# Patient Record
Sex: Female | Born: 1965 | Race: White | Hispanic: No | Smoking: Former smoker
Health system: Southern US, Community
[De-identification: ages and names within clinical notes are randomized; demographics above are authoritative.]

## PROBLEM LIST (undated history)

## (undated) DIAGNOSIS — E785 Hyperlipidemia, unspecified: Secondary | ICD-10-CM

## (undated) DIAGNOSIS — M797 Fibromyalgia: Secondary | ICD-10-CM

## (undated) DIAGNOSIS — F419 Anxiety disorder, unspecified: Secondary | ICD-10-CM

## (undated) DIAGNOSIS — J449 Chronic obstructive pulmonary disease, unspecified: Secondary | ICD-10-CM

## (undated) DIAGNOSIS — G35D Multiple sclerosis, unspecified: Secondary | ICD-10-CM

## (undated) DIAGNOSIS — G4730 Sleep apnea, unspecified: Secondary | ICD-10-CM

## (undated) DIAGNOSIS — F329 Major depressive disorder, single episode, unspecified: Secondary | ICD-10-CM

## (undated) DIAGNOSIS — IMO0002 Reserved for concepts with insufficient information to code with codable children: Secondary | ICD-10-CM

## (undated) DIAGNOSIS — I10 Essential (primary) hypertension: Secondary | ICD-10-CM

## (undated) DIAGNOSIS — G8929 Other chronic pain: Secondary | ICD-10-CM

## (undated) DIAGNOSIS — K219 Gastro-esophageal reflux disease without esophagitis: Secondary | ICD-10-CM

## (undated) DIAGNOSIS — F32A Depression, unspecified: Secondary | ICD-10-CM

## (undated) DIAGNOSIS — D126 Benign neoplasm of colon, unspecified: Secondary | ICD-10-CM

## (undated) DIAGNOSIS — R519 Headache, unspecified: Secondary | ICD-10-CM

## (undated) DIAGNOSIS — T148XXA Other injury of unspecified body region, initial encounter: Secondary | ICD-10-CM

## (undated) DIAGNOSIS — T7840XA Allergy, unspecified, initial encounter: Secondary | ICD-10-CM

## (undated) DIAGNOSIS — R51 Headache: Secondary | ICD-10-CM

## (undated) DIAGNOSIS — G4733 Obstructive sleep apnea (adult) (pediatric): Secondary | ICD-10-CM

## (undated) DIAGNOSIS — J45909 Unspecified asthma, uncomplicated: Secondary | ICD-10-CM

## (undated) DIAGNOSIS — G35 Multiple sclerosis: Secondary | ICD-10-CM

## (undated) HISTORY — PX: ECTOPIC PREGNANCY SURGERY: SHX613

## (undated) HISTORY — DX: Sleep apnea, unspecified: G47.30

## (undated) HISTORY — PX: EXPLORATORY LAPAROTOMY: SUR591

## (undated) HISTORY — DX: Headache: R51

## (undated) HISTORY — DX: Headache, unspecified: R51.9

## (undated) HISTORY — DX: Hyperlipidemia, unspecified: E78.5

## (undated) HISTORY — DX: Major depressive disorder, single episode, unspecified: F32.9

## (undated) HISTORY — PX: SINUS IRRIGATION: SHX2411

## (undated) HISTORY — DX: Benign neoplasm of colon, unspecified: D12.6

## (undated) HISTORY — DX: Obstructive sleep apnea (adult) (pediatric): G47.33

## (undated) HISTORY — DX: Allergy, unspecified, initial encounter: T78.40XA

## (undated) HISTORY — DX: Depression, unspecified: F32.A

## (undated) HISTORY — DX: Fibromyalgia: M79.7

## (undated) HISTORY — PX: COLONOSCOPY: SHX174

## (undated) HISTORY — DX: Unspecified asthma, uncomplicated: J45.909

## (undated) HISTORY — PX: DILATION AND CURETTAGE OF UTERUS: SHX78

## (undated) HISTORY — PX: APPENDECTOMY: SHX54

## (undated) HISTORY — PX: TEMPOROMANDIBULAR JOINT SURGERY: SHX35

## (undated) HISTORY — DX: Gastro-esophageal reflux disease without esophagitis: K21.9

## (undated) HISTORY — PX: TUBAL LIGATION: SHX77

## (undated) HISTORY — DX: Other chronic pain: G89.29

## (undated) HISTORY — PX: ESOPHAGOGASTRODUODENOSCOPY: SHX1529

## (undated) HISTORY — DX: Anxiety disorder, unspecified: F41.9

---

## 2005-12-19 ENCOUNTER — Emergency Department (HOSPITAL_COMMUNITY): Admission: EM | Admit: 2005-12-19 | Discharge: 2005-12-19 | Payer: Self-pay | Admitting: Emergency Medicine

## 2012-10-09 ENCOUNTER — Emergency Department (HOSPITAL_COMMUNITY)
Admission: EM | Admit: 2012-10-09 | Discharge: 2012-10-09 | Disposition: A | Discharge status: Home / Self Care | Payer: Medicare Other | Attending: Emergency Medicine | Admitting: Emergency Medicine

## 2012-10-09 ENCOUNTER — Emergency Department (HOSPITAL_COMMUNITY): Payer: Medicare Other

## 2012-10-09 ENCOUNTER — Encounter (HOSPITAL_COMMUNITY): Payer: Self-pay

## 2012-10-09 DIAGNOSIS — M533 Sacrococcygeal disorders, not elsewhere classified: Secondary | ICD-10-CM | POA: Insufficient documentation

## 2012-10-09 DIAGNOSIS — F17200 Nicotine dependence, unspecified, uncomplicated: Secondary | ICD-10-CM | POA: Insufficient documentation

## 2012-10-09 DIAGNOSIS — G8929 Other chronic pain: Secondary | ICD-10-CM | POA: Insufficient documentation

## 2012-10-09 HISTORY — DX: Reserved for concepts with insufficient information to code with codable children: IMO0002

## 2012-10-09 MED ORDER — CYCLOBENZAPRINE HCL 5 MG PO TABS: 5.0000 mg | ORAL_TABLET | Freq: Three times a day (TID) | ORAL | Status: DC | PRN

## 2012-10-09 MED ORDER — PREDNISONE 20 MG PO TABS: ORAL_TABLET | ORAL | Status: DC

## 2012-10-09 MED ORDER — PREDNISONE 20 MG PO TABS
60.0000 mg | ORAL_TABLET | Freq: Once | ORAL | Status: AC
  Administered 2012-10-09: 60 mg via ORAL
  Filled 2012-10-09: qty 3

## 2012-10-09 MED ORDER — KETOROLAC TROMETHAMINE 60 MG/2ML IM SOLN
60.0000 mg | Freq: Once | INTRAMUSCULAR | Status: AC
  Administered 2012-10-09: 60 mg via INTRAMUSCULAR
  Filled 2012-10-09: qty 2

## 2012-10-09 MED ORDER — CYCLOBENZAPRINE HCL 10 MG PO TABS
5.0000 mg | ORAL_TABLET | Freq: Once | ORAL | Status: AC
  Administered 2012-10-09: 5 mg via ORAL
  Filled 2012-10-09: qty 1

## 2012-10-09 NOTE — ED Notes (Signed)
Pt presents with R hip pain after attempting to lift her 280 pound mother last night.  Pt reports feeling hip "pop in and pop out".

## 2012-10-09 NOTE — ED Provider Notes (Signed)
History  This chart was scribed for [name], MD by [name]. This patient was seen in room TR06C/TR06C and the patient's care was started at 1241.   CSN: [id number]  Arrival date & time 10/09/12  1241   None     Chief Complaint  Patient presents with  . Hip Pain   The history is provided by the patient. No language interpreter was used.   Tracy Gray is a 46 y.o. female who presents to the Emergency Department w/hx of hip pain complaining of constant sudden right hip pain after attempting to lift her 280 lb. Mother last PM. She sates she felt it pop in and out of place and now her pain occurs in sitting to standing movements. She states hip subluxation about 4 times in past  15 years but has not had to be seen for it. She states bilateral hip pain after using bathroom this AM. Pain is worse with sitting and standing up but not with standing or walking. [place] is going back home soon.   Pt was holding her mother around the waist to help her get out of bed to the bedside commode and her mother started to fall and she braced hereelf and felt like both her hips "went almost out of joint" then went back in. She now has pain this morning when she stood up off the toilet in her SIJ area when she stands up from sitting. No pain with walking or standing.   Pt was seen by her PCP and neurologist 2 days ago, ? That she has MS.   She smokes 1ppd, and does not drink. She is on disability for DJD. On hydrocodone from PCP for chronic pain. She has no orthopedist at home.   Her PCP is Dr. Sidonie Dickens on the [facility]  Past Medical History  Diagnosis Date  . Degenerative disc disease   chronic back pain, uses TENS unit  History reviewed. No pertinent past surgical history.  No family history on file.  History  Substance Use Topics  . Smoking status: Current Every Day Smoker -- 1.0 packs/day  . Smokeless tobacco: Not on file  . Alcohol  Use: No  on disability for DJD of back  OB History    Grav Para Term Preterm Abortions TAB SAB Ect Mult Living                  Review of Systems  Constitutional: Negative for fever and chills.  Respiratory: Negative for shortness of breath.   Gastrointestinal: Negative for nausea and vomiting.  Musculoskeletal:       Bilateral hip pain  Neurological: Negative for weakness.  All other systems reviewed and are negative.    Allergies  Bee venom and Penicillins  Home Medications   Current Outpatient Rx  Name Route Sig Dispense Refill  . ALBUTEROL SULFATE HFA 108 (90 BASE) MCG/ACT IN AERS Inhalation Inhale 2 puffs into the lungs 2 (two) times daily. May also take as needed for shortness of breath    . ALBUTEROL SULFATE (2.5 MG/3ML) 0.083% IN NEBU Nebulization Take 2.5 mg by nebulization every 4 (four) hours as needed. For shortness of breath    . BACLOFEN 10 MG PO TABS Oral Take 10-15 mg by mouth 4 (four) times daily - after meals and at bedtime. Take 1 tab 3 times daily and 1.5 tab at bedtime    . CLONAZEPAM 1  MG PO TABS Oral Take 1 mg by mouth 3 (three) times daily.    Marland Kitchen CLONIDINE HCL 0.1 MG PO TABS Oral Take 0.1 mg by mouth daily.    Marland Kitchen FLUTICASONE PROPIONATE 50 MCG/ACT NA SUSP Nasal Place 2 sprays into the nose 2 (two) times daily as needed. For nasal congestion    . FOLIC ACID 400 MCG PO TABS Oral Take 400 mcg by mouth 2 (two) times daily.    . FUROSEMIDE 40 MG PO TABS Oral Take 40 mg by mouth daily.    . GUAIFENESIN ER 600 MG PO TB12 Oral Take 600 mg by mouth daily.    Marland Kitchen HYDROCODONE-ACETAMINOPHEN 7.5-650 MG PO TABS Oral Take 1 tablet by mouth 4 (four) times daily.    Marland Kitchen MAGNESIUM OXIDE 400 (241.3 MG) MG PO TABS Oral Take 400 mg by mouth 2 (two) times daily.    Marland Kitchen MONTELUKAST SODIUM 10 MG PO TABS Oral Take 10 mg by mouth at bedtime.    . OLOPATADINE HCL 0.1 % OP SOLN Both Eyes Place 1 drop into both eyes every 6 (six) hours.    Marland Kitchen POLYETHYL GLYCOL-PROPYL GLYCOL 0.4-0.3 % OP SOLN  Both Eyes Place 1 drop into both eyes 4 (four) times daily as needed. For dry eyes    . POTASSIUM PO Oral Take 1 tablet by mouth 4 (four) times daily.    Marland Kitchen RANITIDINE HCL 300 MG PO TABS Oral Take 300 mg by mouth at bedtime.    Marland Kitchen VITAMIN C 500 MG PO TABS Oral Take 500 mg by mouth 2 (two) times daily.    Marland Kitchen VITAMIN E 400 UNITS PO CAPS Oral Take 400 Units by mouth 2 (two) times daily.      Triage Vitals: BP 108/49  Pulse 69  Temp 98.4 F (36.9 C) (Oral)  Resp 18  Ht 5\' 4"  (1.626 m)  Wt 142 lb (64.411 kg)  BMI 24.37 kg/m2  SpO2 100%  Vital signs normal    Physical Exam  Nursing note and vitals reviewed. Constitutional: She is oriented to person, place, and time. She appears well-developed and well-nourished.  Non-toxic appearance. She does not appear ill. No distress.  HENT:  Head: Normocephalic and atraumatic.  Right Ear: External ear normal.  Left Ear: External ear normal.  Nose: Nose normal. No mucosal edema or rhinorrhea.  Mouth/Throat: Oropharynx is clear and moist and mucous membranes are normal. No dental abscesses or uvula swelling.  Eyes: Conjunctivae normal and EOM are normal. Pupils are equal, round, and reactive to light.  Neck: Normal range of motion and full passive range of motion without pain. Neck supple.  Pulmonary/Chest: Effort normal. No respiratory distress. She has no rhonchi. She exhibits no crepitus.  Abdominal: Normal appearance.  Musculoskeletal: Normal range of motion. She exhibits tenderness. She exhibits no edema.       Moves all extremities well.  Non tender over lumbar/thoracic spine. No pain with ROM of her  Hips or knees. No pain with ROM of her right hip . Pain localized to her SI joints the right worse than the left.  Neurological: She is alert and oriented to person, place, and time. She has normal strength. No cranial nerve deficit.  Skin: Skin is warm, dry and intact. No rash noted. No erythema. No pallor.  Psychiatric: She has a normal mood and  affect. Her speech is normal and behavior is normal. Her mood appears not anxious.    ED Course  Procedures (including critical care time)  Medications  ketorolac (TORADOL) injection 60 mg (not administered)  predniSONE (DELTASONE) tablet 60 mg (not administered)  cyclobenzaprine (FLEXERIL) tablet 5 mg (not administered)   Pt states she is not here to get pain medications, she wants to know what is wrong.   DIAGNOSTIC STUDIES: Oxygen Saturation is 100% on room air, normal by my interpretation.    COORDINATION OF CARE: At 220 PM Discussed treatment plan with patient which includes toradol and pelvis X-ray. Patient agrees.    Dg Pelvis 1-2 Views  10/09/2012  *RADIOLOGY REPORT*  Clinical Data: Right hip pain.  PELVIS - 1-2 VIEW  Comparison: None.  Findings: No acute bony abnormality.  Specifically, no fracture, subluxation, or dislocation.  Soft tissues are intact. Joint spaces are maintained.  Normal bone mineralization.  IMPRESSION: No acute bony abnormality.   Original Report Authenticated By: Cyndie Chime, M.D.      1. Sacro-iliac pain     New Prescriptions   CYCLOBENZAPRINE (FLEXERIL) 5 MG TABLET    Take 1 tablet (5 mg total) by mouth 3 (three) times daily as needed for muscle spasms (muscle pain).   PREDNISONE (DELTASONE) 20 MG TABLET    Take 3 po QD x 2d starting tomorrow, then 2 po QD x 3d then 1 po QD x 3d    Plan discharge  Devoria Albe, MD, FACEP   MDM  I personally performed the services described in this documentation, which was scribed in my presence. The recorded information has been reviewed and considered.  Devoria Albe, MD, Armando Gang          [name], MD 10/09/12 [id number]

## 2013-04-25 ENCOUNTER — Emergency Department (HOSPITAL_COMMUNITY)
Admission: EM | Admit: 2013-04-25 | Discharge: 2013-04-25 | Disposition: A | Discharge status: Home / Self Care | Payer: Medicare Other | Attending: Emergency Medicine | Admitting: Emergency Medicine

## 2013-04-25 ENCOUNTER — Encounter (HOSPITAL_COMMUNITY): Payer: Self-pay | Admitting: Nurse Practitioner

## 2013-04-25 DIAGNOSIS — Y9301 Activity, walking, marching and hiking: Secondary | ICD-10-CM | POA: Insufficient documentation

## 2013-04-25 DIAGNOSIS — Z79899 Other long term (current) drug therapy: Secondary | ICD-10-CM | POA: Insufficient documentation

## 2013-04-25 DIAGNOSIS — Y92009 Unspecified place in unspecified non-institutional (private) residence as the place of occurrence of the external cause: Secondary | ICD-10-CM | POA: Insufficient documentation

## 2013-04-25 DIAGNOSIS — IMO0002 Reserved for concepts with insufficient information to code with codable children: Secondary | ICD-10-CM | POA: Insufficient documentation

## 2013-04-25 DIAGNOSIS — W268XXA Contact with other sharp object(s), not elsewhere classified, initial encounter: Secondary | ICD-10-CM | POA: Insufficient documentation

## 2013-04-25 DIAGNOSIS — F17200 Nicotine dependence, unspecified, uncomplicated: Secondary | ICD-10-CM | POA: Insufficient documentation

## 2013-04-25 DIAGNOSIS — S91309A Unspecified open wound, unspecified foot, initial encounter: Secondary | ICD-10-CM | POA: Insufficient documentation

## 2013-04-25 DIAGNOSIS — Z23 Encounter for immunization: Secondary | ICD-10-CM | POA: Insufficient documentation

## 2013-04-25 DIAGNOSIS — S91332A Puncture wound without foreign body, left foot, initial encounter: Secondary | ICD-10-CM

## 2013-04-25 DIAGNOSIS — Z8739 Personal history of other diseases of the musculoskeletal system and connective tissue: Secondary | ICD-10-CM | POA: Insufficient documentation

## 2013-04-25 HISTORY — DX: Chronic obstructive pulmonary disease, unspecified: J44.9

## 2013-04-25 HISTORY — DX: Other injury of unspecified body region, initial encounter: T14.8XXA

## 2013-04-25 HISTORY — DX: Multiple sclerosis, unspecified: G35.D

## 2013-04-25 HISTORY — DX: Multiple sclerosis: G35

## 2013-04-25 MED ORDER — TETANUS-DIPHTH-ACELL PERTUSSIS 5-2.5-18.5 LF-MCG/0.5 IM SUSP
0.5000 mL | Freq: Once | INTRAMUSCULAR | Status: AC
  Administered 2013-04-25: 0.5 mL via INTRAMUSCULAR
  Filled 2013-04-25: qty 0.5

## 2013-04-25 NOTE — ED Notes (Addendum)
Stepped on a rusty nail with L foot yesterday and she is concerned because she doesn't doesn't know when her last tetanus. Pt reports she cleaned wound with peroxide and pain is minimal. Ambulatory.

## 2013-04-25 NOTE — ED Provider Notes (Signed)
Medical screening examination/treatment/procedure(s) were performed by non-physician practitioner and as supervising physician I was immediately available for consultation/collaboration.   Kirstine Jacquin H Zain Lankford, MD 04/25/13 2327 

## 2013-04-25 NOTE — ED Provider Notes (Signed)
History    This chart was scribed for non-physician practitioner Garlon Hatchet working with Richardean Canal, MD by Donne Anon, ED Scribe. This patient was seen in room TR08C/TR08C and the patient's care was started at 1505.   CSN: [id number]  Arrival date & time 04/25/13  1439   First MD Initiated Contact with Patient 04/25/13 1505      Chief Complaint  Patient presents with  . Foot Injury     The history is provided by the patient. No language interpreter was used.   Tracy Gray is a 47 y.o. female who presents to the Emergency Department with foot pain. States she was walking in her yard and stepped on a piece of lattice with a nail still attached.  Nail protruded through her sandal and punctured the sole of her left foot. There was minimal blood loss following injury.  She cleaned wound thoroughly with peroxide and warm water.  Does not feel that there are any FB left in her foot but she is unsure.  She denies any numbness or tingling of foot. Her tetanus shot is not UTD.     Past Medical History  Diagnosis Date  . Degenerative disc disease     History reviewed. No pertinent past surgical history.  History reviewed. No pertinent family history.  History  Substance Use Topics  . Smoking status: Current Every Day Smoker -- 1.00 packs/day  . Smokeless tobacco: Not on file  . Alcohol Use: No     Review of Systems  Skin: Positive for wound.  Neurological: Negative for numbness.  All other systems reviewed and are negative.    Allergies  Bee venom and Penicillins  Home Medications   Current Outpatient Rx  Name  Route  Sig  Dispense  Refill  . albuterol (PROVENTIL HFA;VENTOLIN HFA) 108 (90 BASE) MCG/ACT inhaler   Inhalation   Inhale 2 puffs into the lungs 2 (two) times daily. May also take as needed for shortness of breath         . albuterol (PROVENTIL) (2.5 MG/3ML) 0.083% nebulizer solution   Nebulization   Take 2.5 mg by nebulization every 4 (four) hours  as needed. For shortness of breath         . baclofen (LIORESAL) 10 MG tablet   Oral   Take 10-15 mg by mouth 4 (four) times daily - after meals and at bedtime. Take 1 tab 3 times daily and 1.5 tab at bedtime         . clonazePAM (KLONOPIN) 1 MG tablet   Oral   Take 1 mg by mouth 3 (three) times daily.         . cloNIDine (CATAPRES) 0.1 MG tablet   Oral   Take 0.1 mg by mouth daily.         . cyclobenzaprine (FLEXERIL) 5 MG tablet   Oral   Take 1 tablet (5 mg total) by mouth 3 (three) times daily as needed for muscle spasms (muscle pain).   30 tablet   0   . fluticasone (FLONASE) 50 MCG/ACT nasal spray   Nasal   Place 2 sprays into the nose 2 (two) times daily as needed. For nasal congestion         . folic acid (FOLVITE) 400 MCG tablet   Oral   Take 400 mcg by mouth 2 (two) times daily.         . furosemide (LASIX) 40 MG tablet   Oral  Take 40 mg by mouth daily.         Marland Kitchen guaiFENesin (MUCINEX) 600 MG 12 hr tablet   Oral   Take 600 mg by mouth daily.         . hydrocodone-acetaminophen (LORCET PLUS) 7.5-650 MG per tablet   Oral   Take 1 tablet by mouth 4 (four) times daily.         . magnesium oxide (MAG-OX) 400 (241.3 MG) MG tablet   Oral   Take 400 mg by mouth 2 (two) times daily.         . montelukast (SINGULAIR) 10 MG tablet   Oral   Take 10 mg by mouth at bedtime.         Marland Kitchen olopatadine (PATANOL) 0.1 % ophthalmic solution   Both Eyes   Place 1 drop into both eyes every 6 (six) hours.         Bertram Gala Glycol-Propyl Glycol (SYSTANE) 0.4-0.3 % SOLN   Both Eyes   Place 1 drop into both eyes 4 (four) times daily as needed. For dry eyes         . POTASSIUM PO   Oral   Take 1 tablet by mouth 4 (four) times daily.         . predniSONE (DELTASONE) 20 MG tablet      Take 3 po QD x 2d starting tomorrow, then 2 po QD x 3d then 1 po QD x 3d   15 tablet   0   . ranitidine (ZANTAC) 300 MG tablet   Oral   Take 300 mg by mouth at  bedtime.         . vitamin C (ASCORBIC ACID) 500 MG tablet   Oral   Take 500 mg by mouth 2 (two) times daily.         . vitamin E 400 UNIT capsule   Oral   Take 400 Units by mouth 2 (two) times daily.           BP 109/61  Pulse 84  Temp(Src) 98.3 F (36.8 C)  Resp 20  SpO2 95%  Physical Exam  Nursing note and vitals reviewed. Constitutional: She is oriented to person, place, and time. She appears well-developed and well-nourished.  HENT:  Head: Normocephalic and atraumatic.  Eyes: Conjunctivae and EOM are normal.  Neck: Normal range of motion. Neck supple.  Cardiovascular: Normal rate, regular rhythm and normal heart sounds.   Pulmonary/Chest: Effort normal and breath sounds normal.  Musculoskeletal: Normal range of motion.  Puncture wound sole of left foot; No surrounding swelling, erythema or signs of infection, no FB present  Neurological: She is alert and oriented to person, place, and time.  Skin: Skin is warm and dry.  Psychiatric: She has a normal mood and affect.    ED Course  Procedures (including critical care time) DIAGNOSTIC STUDIES: Oxygen Saturation is 95% on room air, adequate by my interpretation.    COORDINATION OF CARE: 3:09 PM Discussed treatment plan which includes Tetanus shot and examining for any foreign body with pt at bedside and pt agreed to plan.   3:33 PM Rechecked pt. Examined for any foreign body remaining in foot.   Labs Reviewed - No data to display No results found.   1. Puncture wound of foot, left, initial encounter       MDM   Patient presented to the ED for puncture wound of left foot. Wound was explored, no foreign bodies identified. Tetanus updated.  Encouraged to keep wound clean and monitor for signs of infection including redness, swelling, or abscess formation. Return precautions advised.  I personally performed the services described in this documentation, which was scribed in my presence. The recorded  information has been reviewed and is accurate.       Garlon Hatchet, PA-C 04/25/13 1719

## 2013-12-24 DIAGNOSIS — R252 Cramp and spasm: Secondary | ICD-10-CM | POA: Insufficient documentation

## 2014-04-08 ENCOUNTER — Other Ambulatory Visit: Payer: Self-pay | Admitting: Internal Medicine

## 2014-04-08 ENCOUNTER — Ambulatory Visit
Admission: RE | Admit: 2014-04-08 | Discharge: 2014-04-08 | Disposition: A | Discharge status: Home / Self Care | Payer: Medicare Other | Source: Ambulatory Visit | Attending: Internal Medicine | Admitting: Internal Medicine

## 2014-04-08 DIAGNOSIS — R059 Cough, unspecified: Secondary | ICD-10-CM

## 2014-04-08 DIAGNOSIS — R05 Cough: Secondary | ICD-10-CM

## 2015-03-22 ENCOUNTER — Other Ambulatory Visit: Payer: Self-pay | Admitting: Internal Medicine

## 2015-03-22 DIAGNOSIS — E2839 Other primary ovarian failure: Secondary | ICD-10-CM

## 2015-03-23 ENCOUNTER — Other Ambulatory Visit: Payer: Self-pay

## 2015-03-23 DIAGNOSIS — Z1231 Encounter for screening mammogram for malignant neoplasm of breast: Secondary | ICD-10-CM

## 2015-03-26 ENCOUNTER — Encounter (HOSPITAL_COMMUNITY): Payer: Self-pay

## 2015-03-26 ENCOUNTER — Emergency Department (HOSPITAL_COMMUNITY)
Admission: EM | Admit: 2015-03-26 | Discharge: 2015-03-26 | Disposition: A | Discharge status: Home / Self Care | Payer: Medicare Other | Attending: Emergency Medicine | Admitting: Emergency Medicine

## 2015-03-26 DIAGNOSIS — H66002 Acute suppurative otitis media without spontaneous rupture of ear drum, left ear: Secondary | ICD-10-CM | POA: Insufficient documentation

## 2015-03-26 DIAGNOSIS — M1990 Unspecified osteoarthritis, unspecified site: Secondary | ICD-10-CM | POA: Insufficient documentation

## 2015-03-26 DIAGNOSIS — Z79899 Other long term (current) drug therapy: Secondary | ICD-10-CM | POA: Insufficient documentation

## 2015-03-26 DIAGNOSIS — H9202 Otalgia, left ear: Secondary | ICD-10-CM | POA: Diagnosis present

## 2015-03-26 DIAGNOSIS — J029 Acute pharyngitis, unspecified: Secondary | ICD-10-CM | POA: Insufficient documentation

## 2015-03-26 DIAGNOSIS — J441 Chronic obstructive pulmonary disease with (acute) exacerbation: Secondary | ICD-10-CM | POA: Insufficient documentation

## 2015-03-26 DIAGNOSIS — Z880 Allergy status to penicillin: Secondary | ICD-10-CM | POA: Diagnosis not present

## 2015-03-26 DIAGNOSIS — Z720 Tobacco use: Secondary | ICD-10-CM | POA: Insufficient documentation

## 2015-03-26 MED ORDER — HYDROCODONE-ACETAMINOPHEN 5-325 MG PO TABS
1.0000 | ORAL_TABLET | Freq: Once | ORAL | Status: AC
  Administered 2015-03-26: 1 via ORAL
  Filled 2015-03-26: qty 1

## 2015-03-26 MED ORDER — ANTIPYRINE-BENZOCAINE 5.4-1.4 % OT SOLN: 3.0000 [drp] | OTIC | Status: DC | PRN

## 2015-03-26 MED ORDER — AZITHROMYCIN 250 MG PO TABS: 250.0000 mg | ORAL_TABLET | Freq: Every day | ORAL | Status: DC

## 2015-03-26 MED ORDER — HYDROCODONE-ACETAMINOPHEN 5-325 MG PO TABS: 1.0000 | ORAL_TABLET | ORAL | Status: DC | PRN

## 2015-03-26 MED ORDER — IBUPROFEN 800 MG PO TABS: 800.0000 mg | ORAL_TABLET | Freq: Three times a day (TID) | ORAL | Status: DC | PRN

## 2015-03-26 NOTE — ED Provider Notes (Signed)
CSN: [id number]     Arrival date & time 03/26/15  1108 History  This chart was scribed for non-physician practitioner Clayton Bibles, PA-C, working with Davonna Belling, MD by Zola Button, ED Scribe. This patient was seen in room TR07C/TR07C and the patient's care was started at 11:39 AM.      Chief Complaint  Patient presents with  . Otalgia   The history is provided by the patient. No language interpreter was used.    HPI Comments: Tracy Gray is a 49 y.o. female with a hx of COPD who presents to the Emergency Department complaining of gradual onset, severe, worsening, pressure-like, left sided ear pain radiating  that started 3 days ago. Patient also reports having a ringing sensation that started last night, described as "constantly hearing roaring." She was seen by her PCP, Dr. Lorene Dy, 5 days ago for sinusitis and had received 2 shots (patient is unsure what shots they were). Patient notes that she had an episode of coughing 3 nights ago, during which she "felt her sinus empty to her ear." Her sinus symptoms improved, but her ear started hurting. She tried Hyland's earache drops and ibuprofen but without relief. She also reports having sore throat and cough. Patient also has SOB, but she attributes this to COPD and is unchanged from baseline. She has had some difficulty sleeping due to her symptoms. 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 Patient reports allergies to penicillin.  Past Medical History  Diagnosis Date  . Degenerative disc disease   . COPD (chronic obstructive pulmonary disease)   . Multiple sclerosis   . Nerve damage    Past Surgical History  Procedure Laterality Date  . Tubal ligation    . Sinus irrigation    . Temporomandibular joint surgery    . Exploratory laparotomy    . Ectopic pregnancy surgery     No family history on file. History  Substance Use Topics  . Smoking status: Current Every Day Smoker -- 1.00 packs/day  . Smokeless tobacco:  Not on file  . Alcohol Use: No   OB History    No data available     Review of Systems  Constitutional: Positive for chills.  HENT: Positive for ear pain, sore throat and tinnitus. Negative for congestion, dental problem, rhinorrhea, sinus pressure and trouble swallowing.   Respiratory: Positive for cough and shortness of breath.   Cardiovascular: Negative for chest pain.  Musculoskeletal: Negative for neck pain and neck stiffness.  Skin: Negative for rash and wound.  Allergic/Immunologic: Negative for immunocompromised state.  Hematological: Does not bruise/bleed easily.  Psychiatric/Behavioral: Negative for self-injury.      Allergies  Bee venom and Penicillins  Home Medications   Prior to Admission medications   Medication Sig Start Date End Date Taking? Authorizing Provider  albuterol (PROVENTIL HFA;VENTOLIN HFA) 108 (90 BASE) MCG/ACT inhaler Inhale 2 puffs into the lungs 2 (two) times daily. May also take as needed for shortness of breath    Historical Provider, MD  albuterol (PROVENTIL) (2.5 MG/3ML) 0.083% nebulizer solution Take 2.5 mg by nebulization every 4 (four) hours as needed. For shortness of breath    Historical Provider, MD  baclofen (LIORESAL) 10 MG tablet Take 10-15 mg by mouth 4 (four) times daily - after meals and at bedtime. Take 1 tab 3 times daily and 1.5 tab at bedtime    Historical Provider, MD  clonazePAM (KLONOPIN) 1 MG tablet Take 1 mg by mouth 3 (  three) times daily.    Historical Provider, MD  cloNIDine (CATAPRES) 0.1 MG tablet Take 0.1 mg by mouth daily.    Historical Provider, MD  cycloSPORINE (RESTASIS) 0.05 % ophthalmic emulsion Place 1 drop into both eyes 2 (two) times daily.    Historical Provider, MD  fluticasone (FLONASE) 50 MCG/ACT nasal spray Place 2 sprays into the nose 2 (two) times daily as needed. For nasal congestion    Historical Provider, MD  folic acid (FOLVITE) 299 MCG tablet Take 400 mcg by mouth 2 (two) times daily.    Historical  Provider, MD  furosemide (LASIX) 40 MG tablet Take 40 mg by mouth daily.    Historical Provider, MD  guaiFENesin (MUCINEX) 600 MG 12 hr tablet Take 600 mg by mouth daily.    Historical Provider, MD  HYDROcodone-acetaminophen (NORCO) 7.5-325 MG per tablet Take 1 tablet by mouth 4 (four) times daily as needed for pain.    Historical Provider, MD  magnesium oxide (MAG-OX) 400 (241.3 MG) MG tablet Take 400 mg by mouth 2 (two) times daily.    Historical Provider, MD  montelukast (SINGULAIR) 10 MG tablet Take 10 mg by mouth at bedtime.    Historical Provider, MD  Multiple Vitamin (MULTIVITAMIN WITH MINERALS) TABS Take 1 tablet by mouth daily.    Historical Provider, MD  olopatadine (PATANOL) 0.1 % ophthalmic solution Place 1 drop into both eyes every 6 (six) hours.    Historical Provider, MD  POTASSIUM PO Take 1 tablet by mouth 4 (four) times daily.    Historical Provider, MD  ranitidine (ZANTAC) 300 MG tablet Take 300 mg by mouth at bedtime.    Historical Provider, MD  vitamin C (ASCORBIC ACID) 500 MG tablet Take 500 mg by mouth 2 (two) times daily.    Historical Provider, MD  vitamin E 400 UNIT capsule Take 400 Units by mouth 2 (two) times daily.    Historical Provider, MD   BP 126/61 mmHg  Pulse 100  Temp(Src) 98.1 F (36.7 C) (Oral)  Resp 20  SpO2 99% Physical Exam  Constitutional: She appears well-developed and well-nourished. No distress.  HENT:  Head: Normocephalic and atraumatic.  Left Ear: Tympanic membrane is erythematous and bulging.  Nose: Right sinus exhibits no maxillary sinus tenderness and no frontal sinus tenderness. Left sinus exhibits no maxillary sinus tenderness and no frontal sinus tenderness.  Mouth/Throat: Oropharynx is clear and moist. No oropharyngeal exudate.  Left TM is bulging and opaque with overlying erythema. Generalized tenderness around left ear without palpable lymphadenopathy.  Eyes: Conjunctivae are normal.  Neck: Neck supple.  Cardiovascular: Normal rate  and regular rhythm.   Pulmonary/Chest: Effort normal and breath sounds normal. No respiratory distress. She has no wheezes. She has no rales.  Neurological: She is alert.  Skin: She is not diaphoretic.  Nursing note and vitals reviewed.   ED Course  Procedures  DIAGNOSTIC STUDIES: Oxygen Saturation is 99% on room air, normal by my interpretation.    COORDINATION OF CARE: 11:46 AM-Discussed treatment plan which includes medications with pt at bedside and pt agreed to plan.    Labs Review Labs Reviewed - No data to display  Imaging Review No results found.   EKG Interpretation None      MDM   Final diagnoses:  Acute suppurative otitis media of left ear without spontaneous rupture of tympanic membrane, recurrence not specified    Afebrile, nontoxic patient with otitis media.  No fever. Not immunocompromised.  Not ruptured. No e/o mastoiditis.  D/C home with azithromycin, pain medication.  Auralgan now off the market, cancelled with patient's pharmacist by phone. PCP follow up.  Discussed result, findings, treatment, and follow up  with patient.  Pt given return precautions.  Pt verbalizes understanding and agrees with plan.       I personally performed the services described in this documentation, which was scribed in my presence. The recorded information has been reviewed and is accurate.    Clayton Bibles, PA-C 03/26/15 [place], MD 03/29/15 2216

## 2015-03-26 NOTE — ED Notes (Signed)
Declined W/C at D/C and was escorted to lobby by RN. 

## 2015-03-26 NOTE — ED Notes (Signed)
Pt reports ear ache developing since last night with ringing sensation in ear. Pt in NAD. Denies fever/chills.

## 2015-03-26 NOTE — Discharge Instructions (Signed)
Read the information below.  Use the prescribed medication as directed.  Please discuss all new medications with your pharmacist.  Do not take additional tylenol while taking the prescribed pain medication to avoid overdose.  You may return to the Emergency Department at any time for worsening condition or any new symptoms that concern you.  If you develop fevers, uncontrolled pain, bleeding from your ear,  or difficulty breathing or swallowing return to the ER for a recheck.      Otitis Media Otitis media is redness, soreness, and inflammation of the middle ear. Otitis media may be caused by allergies or, most commonly, by infection. Often it occurs as a complication of the common cold. SIGNS AND SYMPTOMS Symptoms of otitis media may include:  Earache.  Fever.  Ringing in your ear.  Headache.  Leakage of fluid from the ear. DIAGNOSIS To diagnose otitis media, your health care provider will examine your ear with an otoscope. This is an instrument that allows your health care provider to see into your ear in order to examine your eardrum. Your health care provider also will ask you questions about your symptoms. TREATMENT  Typically, otitis media resolves on its own within 3-5 days. Your health care provider may prescribe medicine to ease your symptoms of pain. If otitis media does not resolve within 5 days or is recurrent, your health care provider may prescribe antibiotic medicines if he or she suspects that a bacterial infection is the cause. HOME CARE INSTRUCTIONS   If you were prescribed an antibiotic medicine, finish it all even if you start to feel better.  Take medicines only as directed by your health care provider.  Keep all follow-up visits as directed by your health care provider. SEEK MEDICAL CARE IF:  You have otitis media only in one ear, or bleeding from your nose, or both.  You notice a lump on your neck.  You are not getting better in 3-5 days.  You feel worse  instead of better. SEEK IMMEDIATE MEDICAL CARE IF:   You have pain that is not controlled with medicine.  You have swelling, redness, or pain around your ear or stiffness in your neck.  You notice that part of your face is paralyzed.  You notice that the bone behind your ear (mastoid) is tender when you touch it. MAKE SURE YOU:   Understand these instructions.  Will watch your condition.  Will get help right away if you are not doing well or get worse. Document Released: 09/13/2004 Document Revised: 04/25/2014 Document Reviewed: 07/06/2013  [facility] Patient Information 2015 [place], [place]. This information is not intended to replace advice given to you by your health care provider. Make sure you discuss any questions you have with your health care provider.

## 2015-03-28 ENCOUNTER — Other Ambulatory Visit: Payer: Medicare Other

## 2015-03-30 ENCOUNTER — Ambulatory Visit
Admission: RE | Admit: 2015-03-30 | Discharge: 2015-03-30 | Disposition: A | Discharge status: Home / Self Care | Payer: Medicare Other | Source: Ambulatory Visit | Attending: Internal Medicine | Admitting: Internal Medicine

## 2015-03-30 ENCOUNTER — Ambulatory Visit
Admission: RE | Admit: 2015-03-30 | Discharge: 2015-03-30 | Disposition: A | Discharge status: Home / Self Care | Payer: Medicare Other | Source: Ambulatory Visit

## 2015-03-30 DIAGNOSIS — Z1231 Encounter for screening mammogram for malignant neoplasm of breast: Secondary | ICD-10-CM

## 2015-03-30 DIAGNOSIS — E2839 Other primary ovarian failure: Secondary | ICD-10-CM

## 2015-08-13 ENCOUNTER — Emergency Department (HOSPITAL_COMMUNITY): Payer: Medicare Other

## 2015-08-13 ENCOUNTER — Emergency Department (HOSPITAL_COMMUNITY)
Admission: EM | Admit: 2015-08-13 | Discharge: 2015-08-13 | Disposition: A | Discharge status: Home / Self Care | Payer: Medicare Other | Attending: Emergency Medicine | Admitting: Emergency Medicine

## 2015-08-13 ENCOUNTER — Encounter (HOSPITAL_COMMUNITY): Payer: Self-pay

## 2015-08-13 DIAGNOSIS — S93401A Sprain of unspecified ligament of right ankle, initial encounter: Secondary | ICD-10-CM | POA: Diagnosis not present

## 2015-08-13 DIAGNOSIS — Y9389 Activity, other specified: Secondary | ICD-10-CM | POA: Insufficient documentation

## 2015-08-13 DIAGNOSIS — J449 Chronic obstructive pulmonary disease, unspecified: Secondary | ICD-10-CM | POA: Insufficient documentation

## 2015-08-13 DIAGNOSIS — Z720 Tobacco use: Secondary | ICD-10-CM | POA: Diagnosis not present

## 2015-08-13 DIAGNOSIS — Z87828 Personal history of other (healed) physical injury and trauma: Secondary | ICD-10-CM | POA: Insufficient documentation

## 2015-08-13 DIAGNOSIS — Z8739 Personal history of other diseases of the musculoskeletal system and connective tissue: Secondary | ICD-10-CM | POA: Diagnosis not present

## 2015-08-13 DIAGNOSIS — Z8669 Personal history of other diseases of the nervous system and sense organs: Secondary | ICD-10-CM | POA: Insufficient documentation

## 2015-08-13 DIAGNOSIS — Y998 Other external cause status: Secondary | ICD-10-CM | POA: Diagnosis not present

## 2015-08-13 DIAGNOSIS — Z79899 Other long term (current) drug therapy: Secondary | ICD-10-CM | POA: Diagnosis not present

## 2015-08-13 DIAGNOSIS — W1842XA Slipping, tripping and stumbling without falling due to stepping into hole or opening, initial encounter: Secondary | ICD-10-CM | POA: Diagnosis not present

## 2015-08-13 DIAGNOSIS — S99911A Unspecified injury of right ankle, initial encounter: Secondary | ICD-10-CM | POA: Diagnosis present

## 2015-08-13 DIAGNOSIS — Z880 Allergy status to penicillin: Secondary | ICD-10-CM | POA: Diagnosis not present

## 2015-08-13 DIAGNOSIS — Y9289 Other specified places as the place of occurrence of the external cause: Secondary | ICD-10-CM | POA: Insufficient documentation

## 2015-08-13 MED ORDER — HYDROCODONE-ACETAMINOPHEN 5-325 MG PO TABS: 1.0000 | ORAL_TABLET | Freq: Four times a day (QID) | ORAL | Status: DC | PRN

## 2015-08-13 NOTE — Discharge Instructions (Signed)

## 2015-08-13 NOTE — ED Provider Notes (Signed)
CSN: [id number]     Arrival date & time 08/13/15  2218 History  This chart was scribed for Tracy Circle, PA-C, working with [name], MD by [name], ED Scribe. The patient was seen in room TR07C/TR07C at 11:13 PM.    Chief Complaint  Patient presents with  . Ankle Injury      The history is provided by the patient. No language interpreter was used.    Tracy Gray is a 49 y.o. female who presents to the Emergency Department complaining of right ankle injury onset 2 hours ago PTA. Pt notes that she was feeding her dog when she accidentally stepped into a hole and rolled her foot. Pt notes that her foot began to change colors immediately following the incident and that is what prompted her to come into the ED tonight. Pt reports that she is having pain with bearing weight on the right foot. She denies wound, rash, joint swelling, and any other symptoms.    Past Medical History  Diagnosis Date  . Degenerative disc disease   . COPD (chronic obstructive pulmonary disease)   . Multiple sclerosis   . Nerve damage    Past Surgical History  Procedure Laterality Date  . Tubal ligation    . Sinus irrigation    . Temporomandibular joint surgery    . Exploratory laparotomy    . Ectopic pregnancy surgery     History reviewed. No pertinent family history. Social History  Substance Use Topics  . Smoking status: Current Every Day Smoker -- 0.50 packs/day  . Smokeless tobacco: None  . Alcohol Use: No   OB History    No data available     Review of Systems  Musculoskeletal: Positive for arthralgias. Negative for joint swelling.  Skin: Negative for color change, rash and wound.      Allergies  Bee venom and Penicillins  Home Medications   Prior to Admission medications   Medication Sig Start Date End Date Taking? Authorizing Provider  albuterol (PROVENTIL HFA;VENTOLIN HFA) 108 (90 BASE) MCG/ACT inhaler Inhale 2 puffs into the lungs 2 (two) times daily. May also  take as needed for shortness of breath    Historical Provider, MD  albuterol (PROVENTIL) (2.5 MG/3ML) 0.083% nebulizer solution Take 2.5 mg by nebulization every 4 (four) hours as needed. For shortness of breath    Historical Provider, MD  antipyrine-benzocaine Toniann Fail) otic solution Place 3 drops into the left ear every 2 (two) hours as needed for ear pain. 03/26/15   Clayton Bibles, PA-C  azithromycin (ZITHROMAX) 250 MG tablet Take 1 tablet (250 mg total) by mouth daily. Take first 2 tablets together, then 1 every day until finished. 03/26/15   Clayton Bibles, PA-C  baclofen (LIORESAL) 10 MG tablet Take 10-15 mg by mouth 4 (four) times daily - after meals and at bedtime. Take 1 tab 3 times daily and 1.5 tab at bedtime    Historical Provider, MD  clonazePAM (KLONOPIN) 1 MG tablet Take 1 mg by mouth 3 (three) times daily.    Historical Provider, MD  cloNIDine (CATAPRES) 0.1 MG tablet Take 0.1 mg by mouth daily.    Historical Provider, MD  cycloSPORINE (RESTASIS) 0.05 % ophthalmic emulsion Place 1 drop into both eyes 2 (two) times daily.    Historical Provider, MD  fluticasone (FLONASE) 50 MCG/ACT nasal spray Place 2 sprays into the nose 2 (two) times daily as needed. For nasal congestion    Historical Provider, MD  folic acid (FOLVITE) 287  MCG tablet Take 400 mcg by mouth 2 (two) times daily.    Historical Provider, MD  furosemide (LASIX) 40 MG tablet Take 40 mg by mouth daily.    Historical Provider, MD  guaiFENesin (MUCINEX) 600 MG 12 hr tablet Take 600 mg by mouth daily.    Historical Provider, MD  HYDROcodone-acetaminophen (NORCO/VICODIN) 5-325 MG per tablet Take 1 tablet by mouth every 4 (four) hours as needed for moderate pain or severe pain. 03/26/15   Clayton Bibles, PA-C  ibuprofen (ADVIL,MOTRIN) 800 MG tablet Take 1 tablet (800 mg total) by mouth every 8 (eight) hours as needed for mild pain or moderate pain. 03/26/15   Clayton Bibles, PA-C  magnesium oxide (MAG-OX) 400 (241.3 MG) MG tablet Take 400 mg by mouth 2  (two) times daily.    Historical Provider, MD  montelukast (SINGULAIR) 10 MG tablet Take 10 mg by mouth at bedtime.    Historical Provider, MD  Multiple Vitamin (MULTIVITAMIN WITH MINERALS) TABS Take 1 tablet by mouth daily.    Historical Provider, MD  olopatadine (PATANOL) 0.1 % ophthalmic solution Place 1 drop into both eyes every 6 (six) hours.    Historical Provider, MD  POTASSIUM PO Take 1 tablet by mouth 4 (four) times daily.    Historical Provider, MD  ranitidine (ZANTAC) 300 MG tablet Take 300 mg by mouth at bedtime.    Historical Provider, MD  vitamin C (ASCORBIC ACID) 500 MG tablet Take 500 mg by mouth 2 (two) times daily.    Historical Provider, MD  vitamin E 400 UNIT capsule Take 400 Units by mouth 2 (two) times daily.    Historical Provider, MD   BP 142/61 mmHg  Pulse 85  Temp(Src) 97.8 F (36.6 C) (Oral)  Resp 18  SpO2 97% Physical Exam  Constitutional: She is oriented to person, place, and time. She appears well-developed and well-nourished. No distress.  HENT:  Head: Normocephalic and atraumatic.  Eyes: EOM are normal.  Neck: Neck supple. No tracheal deviation present.  Cardiovascular: Normal rate and intact distal pulses.   Intact distal pulses. Brisk cap refill.   Pulmonary/Chest: Effort normal. No respiratory distress.  Musculoskeletal: Normal range of motion.  Right ankle mildly TTP over the medial aspect with no bony abnormality or deformity. Range, motion, and strength limited secondary to pain.  Neurological: She is alert and oriented to person, place, and time. No sensory deficit.  Nl sensation.  Skin: Skin is warm and dry.  Psychiatric: She has a normal mood and affect. Her behavior is normal.  Nursing note and vitals reviewed.   ED Course  Procedures (including critical care time) DIAGNOSTIC STUDIES: Oxygen Saturation is 97% on RA, nl by my interpretation.    COORDINATION OF CARE: 11:17 PM Discussed treatment plan with pt at bedside and pt agreed to  plan.   Labs Review Labs Reviewed - No data to display  Imaging Review Dg Ankle Complete Right  08/13/2015   CLINICAL DATA:  Patient stepped into a hole today with twisting injury to the right ankle. Medial pain and swelling.  EXAM: RIGHT ANKLE - COMPLETE 3+ VIEW  COMPARISON:  None.  FINDINGS: There is no evidence of fracture, dislocation, or joint effusion. There is no evidence of arthropathy or other focal bone abnormality. Soft tissues are unremarkable.  IMPRESSION: Negative.   Electronically Signed   By: Lucienne Capers M.D.   On: 08/13/2015 23:09   I have personally reviewed and evaluated these images and lab results as part of  my medical decision-making.   EKG Interpretation None      MDM   Final diagnoses:  Ankle sprain, right, initial encounter    Patient with ankle sprain.  Plain films are negative.  DC to home with crutches, ASO, and pain meds.  I personally performed the services described in this documentation, which was scribed in my presence. The recorded information has been reviewed and is accurate.     Tracy Circle, PA-C 08/13/15 2329  [name], MD 08/13/15 [phone]

## 2015-08-13 NOTE — ED Notes (Signed)
Pt stable, ambulatory, states understanding of discharge instructions 

## 2015-08-13 NOTE — ED Notes (Signed)
Onset 2 hours ago pt stepped in hole and rolled right foot.  Pain to medial ankle.  Not able to tolerate full weight on right foot.

## 2015-12-18 ENCOUNTER — Encounter (HOSPITAL_COMMUNITY): Payer: Self-pay | Admitting: *Deleted

## 2015-12-18 ENCOUNTER — Emergency Department (HOSPITAL_COMMUNITY): Payer: Medicare Other

## 2015-12-18 ENCOUNTER — Emergency Department (HOSPITAL_COMMUNITY)
Admission: EM | Admit: 2015-12-18 | Discharge: 2015-12-19 | Disposition: A | Discharge status: Home / Self Care | Payer: Medicare Other | Attending: Emergency Medicine | Admitting: Emergency Medicine

## 2015-12-18 DIAGNOSIS — Z7951 Long term (current) use of inhaled steroids: Secondary | ICD-10-CM | POA: Insufficient documentation

## 2015-12-18 DIAGNOSIS — Z880 Allergy status to penicillin: Secondary | ICD-10-CM | POA: Diagnosis not present

## 2015-12-18 DIAGNOSIS — Z87828 Personal history of other (healed) physical injury and trauma: Secondary | ICD-10-CM | POA: Diagnosis not present

## 2015-12-18 DIAGNOSIS — M519 Unspecified thoracic, thoracolumbar and lumbosacral intervertebral disc disorder: Secondary | ICD-10-CM | POA: Insufficient documentation

## 2015-12-18 DIAGNOSIS — Z8669 Personal history of other diseases of the nervous system and sense organs: Secondary | ICD-10-CM | POA: Diagnosis not present

## 2015-12-18 DIAGNOSIS — Z79899 Other long term (current) drug therapy: Secondary | ICD-10-CM | POA: Diagnosis not present

## 2015-12-18 DIAGNOSIS — R05 Cough: Secondary | ICD-10-CM | POA: Diagnosis present

## 2015-12-18 DIAGNOSIS — J439 Emphysema, unspecified: Secondary | ICD-10-CM | POA: Insufficient documentation

## 2015-12-18 DIAGNOSIS — J40 Bronchitis, not specified as acute or chronic: Secondary | ICD-10-CM

## 2015-12-18 DIAGNOSIS — F17200 Nicotine dependence, unspecified, uncomplicated: Secondary | ICD-10-CM | POA: Insufficient documentation

## 2015-12-18 LAB — BASIC METABOLIC PANEL
Anion gap: 12 (ref 5–15)
BUN: 11 mg/dL (ref 6–20)
CO2: 23 mmol/L (ref 22–32)
Calcium: 8.9 mg/dL (ref 8.9–10.3)
Chloride: 105 mmol/L (ref 101–111)
Creatinine, Ser: 0.89 mg/dL (ref 0.44–1.00)
GFR calc Af Amer: >60 mL/min (ref >60)
GFR calc non Af Amer: >60 mL/min (ref >60)
Glucose, Bld: 127 mg/dL — ABNORMAL HIGH (ref 65–99)
Potassium: 3.2 mmol/L — ABNORMAL LOW (ref 3.5–5.1)
Sodium: 140 mmol/L (ref 135–145)

## 2015-12-18 LAB — CBC
HCT: 43.7 % (ref 36.0–46.0)
Hemoglobin: 14.3 g/dL (ref 12.0–15.0)
MCH: 30.8 pg (ref 26.0–34.0)
MCHC: 32.7 g/dL (ref 30.0–36.0)
MCV: 94.2 fL (ref 78.0–100.0)
Platelets: 209 10*3/uL (ref 150–400)
RBC: 4.64 MIL/uL (ref 3.87–5.11)
RDW: 13.8 % (ref 11.5–15.5)
WBC: 9.2 10*3/uL (ref 4.0–10.5)

## 2015-12-18 MED ORDER — DEXAMETHASONE SODIUM PHOSPHATE 10 MG/ML IJ SOLN
10.0000 mg | Freq: Once | INTRAMUSCULAR | Status: AC
  Administered 2015-12-19: 10 mg via INTRAMUSCULAR
  Filled 2015-12-18: qty 1

## 2015-12-18 NOTE — ED Provider Notes (Addendum)
CSN: [id number]     Arrival date & time 12/18/15  1626 History   First MD Initiated Contact with Patient 12/18/15 2122     Chief Complaint  Patient presents with  . Shortness of Breath  . Cough     (Consider location/radiation/quality/duration/timing/severity/associated sxs/prior Treatment) HPI Comments: Pt comes in with cc of cough. Pt has hx of COPD exacerbation. Pt has been coughing x 5 weeks, and she is s/p 2 rounds of antibiotics from pcp. Cough is producing some phlegm, it is white/thick. There is some chest discomfort, usually with cough and it is midsternal. Pain is moderately severe. Pt also reports that the cough is so severe, that she has trouble catching her breath and on occasions she felt like she was going to faint. Cough worse with talking and with cold air.    ROS 10 Systems reviewed and are negative for acute change except as noted in the HPI.     Patient is a 49 y.o. female presenting with shortness of breath and cough. The history is provided by the patient.  Shortness of Breath Associated symptoms: cough   Cough Associated symptoms: shortness of breath     Past Medical History  Diagnosis Date  . Degenerative disc disease   . COPD (chronic obstructive pulmonary disease) ([place])   . Multiple sclerosis ([place])   . Nerve damage    Past Surgical History  Procedure Laterality Date  . Tubal ligation    . Sinus irrigation    . Temporomandibular joint surgery    . Exploratory laparotomy    . Ectopic pregnancy surgery     No family history on file. Social History  Substance Use Topics  . Smoking status: Current Every Day Smoker -- 0.50 packs/day  . Smokeless tobacco: None  . Alcohol Use: No   OB History    No data available     Review of Systems  Respiratory: Positive for cough and shortness of breath.       Allergies  Bee venom and Penicillins  Home Medications   Prior to Admission medications   Medication Sig Start Date End Date Taking?  Authorizing Provider  albuterol (PROVENTIL HFA;VENTOLIN HFA) 108 (90 BASE) MCG/ACT inhaler Inhale 2 puffs into the lungs 2 (two) times daily. May also take as needed for shortness of breath   Yes Historical Provider, MD  baclofen (LIORESAL) 10 MG tablet Take 10-15 mg by mouth 4 (four) times daily - after meals and at bedtime. Take 1 tab 3 times daily and 1.5 tab at bedtime   Yes Historical Provider, MD  clarithromycin (BIAXIN) 250 MG tablet Take 250 mg by mouth 2 (two) times daily. 12/05/15 12/19/15 Yes Historical Provider, MD  clonazePAM (KLONOPIN) 1 MG tablet Take 2 mg by mouth at bedtime.    Yes Historical Provider, MD  cloNIDine (CATAPRES) 0.1 MG tablet Take 0.1 mg by mouth at bedtime.    Yes Historical Provider, MD  cyclobenzaprine (FLEXERIL) 10 MG tablet Take 10 mg by mouth 4 (four) times daily.   Yes Historical Provider, MD  cycloSPORINE (RESTASIS) 0.05 % ophthalmic emulsion Place 1 drop into both eyes 2 (two) times daily.   Yes Historical Provider, MD  estrogens, conjugated, (PREMARIN) 0.625 MG tablet Take 0.625 mg by mouth daily. 07/13/14  Yes Historical Provider, MD  fluticasone (FLONASE) 50 MCG/ACT nasal spray Place 2 sprays into the nose 2 (two) times daily. For nasal congestion   Yes Historical Provider, MD  folic acid (FOLVITE) [id number] MCG tablet Take  400 mcg by mouth 2 (two) times daily.   Yes Historical Provider, MD  furosemide (LASIX) 40 MG tablet Take 40 mg by mouth daily.   Yes Historical Provider, MD  guaiFENesin (MUCINEX) 600 MG 12 hr tablet Take 600 mg by mouth daily.   Yes Historical Provider, MD  HYDROcodone-acetaminophen (NORCO) 7.5-325 MG tablet Take 1 tablet by mouth every 6 (six) hours.   Yes Historical Provider, MD  HYDROcodone-acetaminophen (NORCO/VICODIN) 5-325 MG per tablet Take 1-2 tablets by mouth every 6 (six) hours as needed. 08/13/15  Yes [name], PA-C  magnesium oxide (MAG-OX) 400 (241.3 MG) MG tablet Take 400 mg by mouth 2 (two) times daily.   Yes Historical  Provider, MD  medroxyPROGESTERone (PROVERA) 10 MG tablet Take 10 mg by mouth daily. Takes 1 tab daily for 7 days, revolving regimen every 3 months for period. 11/09/15  Yes Historical Provider, MD  montelukast (SINGULAIR) 10 MG tablet Take 10 mg by mouth at bedtime.   Yes Historical Provider, MD  Multiple Vitamin (MULTIVITAMIN WITH MINERALS) TABS Take 1 tablet by mouth daily.   Yes Historical Provider, MD  Olopatadine HCl (PATADAY) 0.2 % SOLN Place 1 drop into both eyes daily.   Yes Historical Provider, MD  POTASSIUM PO Take 1-2 tablets by mouth 4 (four) times daily. Takes 1 tab 3 times daily and takes 2 tabs at bedtime   Yes Historical Provider, MD  ranitidine (ZANTAC) 300 MG tablet Take 300 mg by mouth every morning.    Yes Historical Provider, MD  vitamin C (ASCORBIC ACID) 500 MG tablet Take 500 mg by mouth 2 (two) times daily.   Yes Historical Provider, MD  vitamin E 400 UNIT capsule Take 400 Units by mouth 2 (two) times daily.   Yes Historical Provider, MD  antipyrine-benzocaine Toniann Fail) otic solution Place 3 drops into the left ear every 2 (two) hours as needed for ear pain. 03/26/15   Clayton Bibles, PA-C  azithromycin (ZITHROMAX) 250 MG tablet Take 1 tablet (250 mg total) by mouth daily. Take first 2 tablets together, then 1 every day until finished. 12/19/15   Varney Biles, MD  benzonatate (TESSALON) 100 MG capsule Take 1 capsule (100 mg total) by mouth every 8 (eight) hours. 12/19/15   Varney Biles, MD  cefdinir (OMNICEF) 300 MG capsule Take 1 capsule (300 mg total) by mouth 2 (two) times daily. 12/19/15   Varney Biles, MD  ibuprofen (ADVIL,MOTRIN) 800 MG tablet Take 1 tablet (800 mg total) by mouth every 8 (eight) hours as needed for mild pain or moderate pain. 03/26/15   Clayton Bibles, PA-C  predniSONE (DELTASONE) 10 MG tablet Take 6 tablets (60 mg total) by mouth daily. 12/19/15   Kazimir Hartnett, MD   BP 135/65 mmHg  Pulse 91  Temp(Src) 98.5 F (36.9 C) (Oral)  Resp 18  SpO2  97% Physical Exam  Constitutional: She is oriented to person, place, and time. She appears well-developed.  HENT:  Head: Normocephalic and atraumatic.  Eyes: Conjunctivae and EOM are normal. Pupils are equal, round, and reactive to light.  Neck: Normal range of motion. Neck supple.  Cardiovascular: Normal rate, regular rhythm and normal heart sounds.   Pulmonary/Chest: Effort normal. No respiratory distress. She has wheezes.  Abdominal: Soft. Bowel sounds are normal. She exhibits no distension. There is no tenderness. There is no rebound and no guarding.  Neurological: She is alert and oriented to person, place, and time.  Skin: Skin is warm and dry.  Nursing note and vitals  reviewed.   ED Course  Procedures (including critical care time) Labs Review Labs Reviewed  BASIC METABOLIC PANEL - Abnormal; Notable for the following:    Potassium 3.2 (*)    Glucose, Bld 127 (*)    All other components within normal limits  CBC  URINALYSIS, ROUTINE W REFLEX MICROSCOPIC (NOT AT [facility])    Imaging Review Dg Chest 2 View  12/18/2015  CLINICAL DATA:  Short of breath EXAM: CHEST  2 VIEW COMPARISON:  04/08/2014 FINDINGS: Bullous emphysema in the apices. Carotid vascular markings in the lung bases has progressed. This may be related to bullous emphysema in the apices however superimposed pneumonitis not excluded. The lungs are hyperinflated. Negative for heart failure or effusion. IMPRESSION: Apical bullous emphysema. Progressive crowding of markings in the bases bilaterally which may be due to atelectasis or pneumonia. Electronically Signed   By: Franchot Gallo M.D.   On: 12/18/2015 17:37   I have personally reviewed and evaluated these images and lab results as part of my medical decision-making.   EKG Interpretation   Date/Time:  Monday December 18 2015 16:36:08 EST Ventricular Rate:  117 PR Interval:  120 QRS Duration: 76 QT Interval:  312 QTC Calculation: 435 R Axis:   87 Text  Interpretation:  Sinus tachycardia Nonspecific ST and T wave  abnormality Abnormal ECG No acute changes No old tracing to compare  Confirmed by Christyna Letendre, MD, Thelma Comp [phone]) on 12/18/2015 11:12:58 PM      MDM   Final diagnoses:  Pulmonary emphysema, unspecified emphysema type ([place])  Bronchitis    Pt comes in with cc dib, cough. Pt has been treated with clarithromycin x 2 times, but no response. She is feeling worse despite those meds. Pt is noted to be tachycardic at arrival - she is actually on estrogen - but her symptoms are not consistent with PE (severe cough). Additionally, Hr in the 80s and 90s when i saw her. Will defer PE workup if clinically indicated.  Will tx for CAP/bronchitis agresively. She has taken cephalosporin w/o any side effects in the past. Will give pulm f/u.  Varney Biles, MD 12/19/15 [id number]  Varney Biles, MD 12/19/15 [id number]

## 2015-12-18 NOTE — ED Notes (Signed)
Pt reports cough/congestion for 3 weeks. Pt was given antibiotic and nebulizer.  Pt states that symptoms have worsened. Pt reports worsening SOB today. Pt states that she "passed out" today as well.

## 2015-12-19 DIAGNOSIS — J439 Emphysema, unspecified: Secondary | ICD-10-CM | POA: Diagnosis not present

## 2015-12-19 MED ORDER — BENZONATATE 100 MG PO CAPS: 100.0000 mg | ORAL_CAPSULE | Freq: Three times a day (TID) | ORAL | Status: DC

## 2015-12-19 MED ORDER — PREDNISONE 10 MG PO TABS: 60.0000 mg | ORAL_TABLET | Freq: Every day | ORAL | Status: DC

## 2015-12-19 MED ORDER — AZITHROMYCIN 250 MG PO TABS: 250.0000 mg | ORAL_TABLET | Freq: Every day | ORAL | Status: DC

## 2015-12-19 MED ORDER — CEFDINIR 300 MG PO CAPS: 300.0000 mg | ORAL_CAPSULE | Freq: Two times a day (BID) | ORAL | Status: DC

## 2015-12-19 NOTE — ED Notes (Signed)
Patient is alert and orientedx4.  Patient was explained discharge instructions and they understood them with no questions.   

## 2015-12-19 NOTE — Discharge Instructions (Signed)
Take the meds provided. See the Pulmonologist in 1 week.   Chronic Obstructive Pulmonary Disease Exacerbation Chronic obstructive pulmonary disease (COPD) is a common lung condition in which airflow from the lungs is limited. COPD is a general term that can be used to describe many different lung problems that limit airflow, including chronic bronchitis and emphysema. COPD exacerbations are episodes when breathing symptoms become much worse and require extra treatment. Without treatment, COPD exacerbations can be life threatening, and frequent COPD exacerbations can cause further damage to your lungs. CAUSES  Respiratory infections.  Exposure to smoke.  Exposure to air pollution, chemical fumes, or dust. Sometimes there is no apparent cause or trigger. RISK FACTORS  Smoking cigarettes.  Older age.  Frequent prior COPD exacerbations. SIGNS AND SYMPTOMS  Increased coughing.  Increased thick spit (sputum) production.  Increased wheezing.  Increased shortness of breath.  Rapid breathing.  Chest tightness. DIAGNOSIS Your medical history, a physical exam, and tests will help your health care provider make a diagnosis. Tests may include:  A chest X-ray.  Basic lab tests.  Sputum testing.  An arterial blood gas test. TREATMENT Depending on the severity of your COPD exacerbation, you may need to be admitted to a hospital for treatment. Some of the treatments commonly used to treat COPD exacerbations are:   Antibiotic medicines.  Bronchodilators. These are drugs that expand the air passages. They may be given with an inhaler or nebulizer. Spacer devices may be needed to help improve drug delivery.  Corticosteroid medicines.  Supplemental oxygen therapy.  Airway clearing techniques, such as noninvasive ventilation (NIV) and positive expiratory pressure (PEP). These provide respiratory support through a mask or other noninvasive device. HOME CARE INSTRUCTIONS  Do not  smoke. Quitting smoking is very important to prevent COPD from getting worse and exacerbations from happening as often.  Avoid exposure to all substances that irritate the airway, especially to tobacco smoke.  If you were prescribed an antibiotic medicine, finish it all even if you start to feel better.  Take all medicines as directed by your health care provider.It is important to use correct technique with inhaled medicines.  Drink enough fluids to keep your urine clear or pale yellow (unless you have a medical condition that requires fluid restriction).  Use a cool mist vaporizer. This makes it easier to clear your chest when you cough.  If you have a home nebulizer and oxygen, continue to use them as directed.  Maintain all necessary vaccinations to prevent infections.  Exercise regularly.  Eat a healthy diet.  Keep all follow-up appointments as directed by your health care provider. SEEK IMMEDIATE MEDICAL CARE IF:  You have worsening shortness of breath.  You have trouble talking.  You have severe chest pain.  You have blood in your sputum.  You have a fever.  You have weakness, vomit repeatedly, or faint.  You feel confused.  You continue to get worse. MAKE SURE YOU:  Understand these instructions.  Will watch your condition.  Will get help right away if you are not doing well or get worse.   This information is not intended to replace advice given to you by your health care provider. Make sure you discuss any questions you have with your health care provider.   Document Released: 10/06/2007 Document Revised: 12/30/2014 Document Reviewed: 08/13/2013 Elsevier Interactive Patient Education [employer].

## 2016-01-16 ENCOUNTER — Institutional Professional Consult (permissible substitution): Payer: Medicare Other | Admitting: Internal Medicine

## 2016-02-01 ENCOUNTER — Institutional Professional Consult (permissible substitution): Payer: Medicare Other | Admitting: Internal Medicine

## 2016-02-02 ENCOUNTER — Encounter (INDEPENDENT_AMBULATORY_CARE_PROVIDER_SITE_OTHER): Payer: Self-pay

## 2016-02-02 ENCOUNTER — Encounter: Payer: Self-pay | Admitting: Internal Medicine

## 2016-02-02 ENCOUNTER — Ambulatory Visit (INDEPENDENT_AMBULATORY_CARE_PROVIDER_SITE_OTHER): Payer: Medicare Other | Admitting: Internal Medicine

## 2016-02-02 VITALS — BP 116/76 | HR 86 | Ht 64.0 in | Wt 157.0 lb

## 2016-02-02 DIAGNOSIS — R058 Other specified cough: Secondary | ICD-10-CM | POA: Insufficient documentation

## 2016-02-02 DIAGNOSIS — Z720 Tobacco use: Secondary | ICD-10-CM

## 2016-02-02 DIAGNOSIS — R0600 Dyspnea, unspecified: Secondary | ICD-10-CM | POA: Insufficient documentation

## 2016-02-02 DIAGNOSIS — R05 Cough: Secondary | ICD-10-CM

## 2016-02-02 DIAGNOSIS — F17210 Nicotine dependence, cigarettes, uncomplicated: Secondary | ICD-10-CM

## 2016-02-02 MED ORDER — PANTOPRAZOLE SODIUM 40 MG PO TBEC: 40.0000 mg | DELAYED_RELEASE_TABLET | Freq: Every day | ORAL | Status: DC

## 2016-02-02 NOTE — Progress Notes (Signed)
Subjective:    Patient ID: Tracy Gray, female    DOB: 1966/12/23,     MRN: [id number]  HPI  6 yowf active smoker "sick"  since September 2016 with ear pain, nasal congestion, cough and sob rx per Dr Janeice Robinson with multiple abx and prednisone but no better then to ER > still only  some better  referred to pulmonary clinic 02/02/2016 for copd eval by EDP   02/02/2016 1st [facility] office visit/ [name]   Chief Complaint  Patient presents with  . Pulmonary Consult    Referred by [facility] after ED visit on 12/17/16. Pt states that she was dxed with COPD "years ago"- c/o increased SOB and cough for the past few months.  Cough is prod with clear to green sputum. She is SOB with or without any exertion. She is using albuterol 4 x per day on average.   symptoms came on abruptly p "virus" in Sept2016  and consistent of variable sob are worse after supper assoc with choking sensation / sore throat and overt HB and persistent Doe x car to house with groceries every time. Cough is worse in am's, minimally productive, only occ green now.  No obvious other patterns in day to day or daytime variabilty or assoc   cp or chest tightness, subjective wheeze overt hb symptoms. No unusual exp hx or h/o childhood pna/ asthma or knowledge of premature birth.  Sleeping ok without nocturnal  or early am exacerbation  of respiratory  c/o's or need for noct saba. Also denies any obvious fluctuation of symptoms with weather or environmental changes or other aggravating or alleviating factors except as outlined above   Current Medications, Allergies, Complete Past Medical History, Past Surgical History, Family History, and Social History were reviewed in [employer] record.            Review of Systems  Constitutional: Negative for fever, chills and unexpected weight change.  HENT: Positive for congestion and sore throat. Negative for dental problem, ear pain, nosebleeds,  postnasal drip, rhinorrhea, sinus pressure, sneezing, trouble swallowing and voice change.   Eyes: Negative for visual disturbance.  Respiratory: Positive for cough and shortness of breath. Negative for choking.   Cardiovascular: Negative for chest pain and leg swelling.  Gastrointestinal: Negative for vomiting, abdominal pain and diarrhea.  Genitourinary: Negative for difficulty urinating.       Acid heartburn Indigestion  Musculoskeletal: Positive for arthralgias.  Skin: Negative for rash.  Neurological: Negative for tremors, syncope and headaches.  Hematological: Does not bruise/bleed easily.       Objective:   Physical Exam  Hoarse amb wf nad with classic pseudowheezing some better with plm  Wt Readings from Last 3 Encounters:  02/02/16 157 lb (71.215 kg)  10/09/12 142 lb (64.411 kg)    Vital signs reviewed  HEENT: nl dentition, turbinates, and oropharynx. Nl external ear canals without cough reflex   NECK :  without JVD/Nodes/TM/ nl carotid upstrokes bilaterally   LUNGS: no acc muscle use,  Nl contour chest which is clear to A and P bilaterally without cough on insp or exp maneuvers   CV:  RRR  no s3 or murmur or increase in P2, no edema   ABD:  soft and nontender with nl inspiratory excursion in the supine position. 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 Carotid vascular markings in the lung bases has progressed. This may be related to bullous emphysema in the apices however superimposed pneumonitis not excluded.  The lungs are hyperinflated. Negative for heart failure or effusion.    Labs ordered/ reviewed:       Chemistry      Component Value Date/Time   NA 140 12/18/2015 1733   K 3.2* 12/18/2015 1733   CL 105 12/18/2015 1733   CO2 23 12/18/2015 1733   BUN 11 12/18/2015 1733   CREATININE 0.89 12/18/2015 1733      Component Value Date/Time   CALCIUM 8.9 12/18/2015 1733        Lab Results  Component Value Date   WBC 9.2 12/18/2015   HGB 14.3 12/18/2015   HCT 43.7 12/18/2015   MCV 94.2 12/18/2015   PLT 209 12/18/2015             Assessment & Plan:

## 2016-02-02 NOTE — Patient Instructions (Addendum)
Please see patient coordinator before you leave today  to schedule sinus ct   Pantoprazole (protonix) 40 mg   Take  30-60 min before first meal of the day and Zantac  @  bedtime until return to office - this is the best way to tell whether stomach acid is contributing to your problem.    GERD (REFLUX)  is an extremely common cause of respiratory symptoms just like yours , many times with no obvious heartburn at all.    It can be treated with medication, but also with lifestyle changes including elevation of the head of your bed (ideally with 6 inch  bed blocks),  Smoking cessation, avoidance of late meals, excessive alcohol, and avoid fatty foods, chocolate, peppermint, colas, red wine, and acidic juices such as orange juice.  NO MINT OR MENTHOL PRODUCTS SO NO COUGH DROPS  USE SUGARLESS CANDY INSTEAD (Jolley ranchers or Stover's or Life Savers) or even ice chips will also do - the key is to swallow to prevent all throat clearing. NO OIL BASED VITAMINS - use powdered substitutes.  Please schedule a follow up office visit in 4 weeks, sooner if needed  Add: did not go to lab/ ok to complete when returns

## 2016-02-03 DIAGNOSIS — F17210 Nicotine dependence, cigarettes, uncomplicated: Secondary | ICD-10-CM | POA: Insufficient documentation

## 2016-02-03 MED ORDER — RANITIDINE HCL 300 MG PO TABS: ORAL_TABLET | ORAL | Status: DC

## 2016-02-03 NOTE — Assessment & Plan Note (Signed)
Classic Upper airway cough syndrome, so named because it's frequently impossible to sort out how much is  CR/sinusitis with freq throat clearing (which can be related to primary GERD)   vs  causing  secondary (" extra esophageal")  GERD from wide swings in gastric pressure that occur with throat clearing, often  promoting self use of mint and menthol lozenges that reduce the lower esophageal sphincter tone and exacerbate the problem further in a cyclical fashion.   These are the same pts (now being labeled as having "irritable larynx syndrome" by some cough centers) who not infrequently have a history of having failed to tolerate ace inhibitors,  dry powder inhalers or biphosphonates or report having atypical reflux symptoms that don't respond to standard doses of PPI , and are easily confused as having aecopd or asthma flares by even experienced allergists/ pulmonologists.   At this point rx with max gerd then regroup in 4 weeks - see avs

## 2016-02-03 NOTE — Assessment & Plan Note (Signed)

## 2016-02-03 NOTE — Assessment & Plan Note (Addendum)
02/02/2016  Walked RA x 3 laps @ 185 ft each stopped due to End of study, nl pace, no sob or desat  / some dizziness    Symptoms are markedly disproportionate to objective findings and not clear this is a lung problem but pt does appear to have difficult airway management issues. DDX of  difficult airways management almost all start with A and  include Adherence, Ace Inhibitors, Acid Reflux, Active Sinus Disease, Alpha 1 Antitripsin deficiency, Anxiety masquerading as Airways dz,  ABPA,  Allergy(esp in young), Aspiration (esp in elderly), Adverse effects of meds,  Active smokers, A bunch of PE's (a small clot burden can't cause this syndrome unless there is already severe underlying pulm or vascular dz with poor reserve) plus two Bs  = Bronchiectasis and Beta blocker use..and one C= CHF   Adherence is always the initial "prime suspect" and is a multilayered concern that requires a "trust but verify" approach in every patient - starting with knowing how to use medications, especially inhalers, correctly, keeping up with refills and understanding the fundamental difference between maintenance and prns vs those medications only taken for a very short course and then stopped and not refilled.   ? Acid (or non-acid) GERD > always difficult to exclude as up to 75% of pts in some series report no assoc GI/ Heartburn symptoms> rec max (24h)  acid suppression and diet restrictions/ reviewed and instructions given in writing.   Active smoking (see separate a/p)   ? Anxiety > usually at the bottom of this list of usual suspects but should be much higher on this pt's based on H and P and note already on psychotropics .   ? Allergies/ asthma > allergy profile ordered  ? Active sinus dz > Ct sinus ordered   ? chf > no bnp on file,  Ordered   I had an extended discussion with the patient reviewing all relevant studies completed to date and  lasting 35 minutes of a 63minute visit    Each maintenance medication  was reviewed in detail including most importantly the difference between maintenance and prns and under what circumstances the prns are to be triggered using an action plan format that is not reflected in the computer generated alphabetically organized AVS.    Please see instructions for details which were reviewed in writing and the patient given a copy highlighting the part that I personally wrote and discussed at today's ov.

## 2016-02-06 ENCOUNTER — Other Ambulatory Visit: Payer: Self-pay | Admitting: *Deleted

## 2016-02-06 ENCOUNTER — Telehealth: Payer: Self-pay | Admitting: Internal Medicine

## 2016-02-06 ENCOUNTER — Ambulatory Visit (INDEPENDENT_AMBULATORY_CARE_PROVIDER_SITE_OTHER)
Admission: RE | Admit: 2016-02-06 | Discharge: 2016-02-06 | Disposition: A | Discharge status: Home / Self Care | Payer: Medicare Other | Source: Ambulatory Visit | Attending: Internal Medicine | Admitting: Internal Medicine

## 2016-02-06 DIAGNOSIS — R0600 Dyspnea, unspecified: Secondary | ICD-10-CM

## 2016-02-06 DIAGNOSIS — R05 Cough: Secondary | ICD-10-CM

## 2016-02-06 DIAGNOSIS — R058 Other specified cough: Secondary | ICD-10-CM

## 2016-02-06 MED ORDER — MOXIFLOXACIN HCL 400 MG PO TABS: 400.0000 mg | ORAL_TABLET | Freq: Every day | ORAL | Status: DC

## 2016-02-06 NOTE — Telephone Encounter (Signed)
Spoke with pt, states that her pharmacy told her that her insurance is not covering her medications prescribed by our office, needs covered alternatives.  Pt unsure of what medication is not covered.   Pt uses [facility] on [address].  Called pharmacy, states that the avelox is not covered by insurance.  Levaquin is covered alternative.    MW please advise.  Thanks!  ------------------------------------------------------  Notes Recorded by Tanda Rockers, MD on 02/06/2016 at 1:24 PM Call patient : Study is c/w sinusitis rec avelox 400 mg daily x 10 days

## 2016-02-07 MED ORDER — LEVOFLOXACIN 500 MG PO TABS: 500.0000 mg | ORAL_TABLET | Freq: Every day | ORAL | Status: DC

## 2016-02-07 NOTE — Telephone Encounter (Signed)
lmtcb x1 for pt. 

## 2016-02-07 NOTE — Telephone Encounter (Signed)
Ok levaquin 500 daily x 10 days

## 2016-02-07 NOTE — Telephone Encounter (Signed)
[phone], pt cb

## 2016-02-07 NOTE — Telephone Encounter (Signed)
Pt is aware of medication change. Rx has been sent in. Nothing further was needed. 

## 2016-03-01 ENCOUNTER — Encounter: Payer: Self-pay | Admitting: Internal Medicine

## 2016-03-01 ENCOUNTER — Ambulatory Visit (INDEPENDENT_AMBULATORY_CARE_PROVIDER_SITE_OTHER)
Admission: RE | Admit: 2016-03-01 | Discharge: 2016-03-01 | Disposition: A | Discharge status: Home / Self Care | Payer: Medicare Other | Source: Ambulatory Visit | Attending: Internal Medicine | Admitting: Internal Medicine

## 2016-03-01 ENCOUNTER — Other Ambulatory Visit: Payer: Self-pay | Admitting: Internal Medicine

## 2016-03-01 ENCOUNTER — Other Ambulatory Visit (INDEPENDENT_AMBULATORY_CARE_PROVIDER_SITE_OTHER): Payer: Medicare Other

## 2016-03-01 ENCOUNTER — Ambulatory Visit (INDEPENDENT_AMBULATORY_CARE_PROVIDER_SITE_OTHER): Payer: Medicare Other | Admitting: Internal Medicine

## 2016-03-01 VITALS — BP 108/80 | HR 96 | Wt 157.0 lb

## 2016-03-01 DIAGNOSIS — Z720 Tobacco use: Secondary | ICD-10-CM | POA: Diagnosis not present

## 2016-03-01 DIAGNOSIS — J45909 Unspecified asthma, uncomplicated: Secondary | ICD-10-CM | POA: Diagnosis not present

## 2016-03-01 DIAGNOSIS — F17210 Nicotine dependence, cigarettes, uncomplicated: Secondary | ICD-10-CM | POA: Diagnosis not present

## 2016-03-01 DIAGNOSIS — J329 Chronic sinusitis, unspecified: Secondary | ICD-10-CM | POA: Insufficient documentation

## 2016-03-01 DIAGNOSIS — R0600 Dyspnea, unspecified: Secondary | ICD-10-CM | POA: Diagnosis not present

## 2016-03-01 DIAGNOSIS — R05 Cough: Secondary | ICD-10-CM | POA: Diagnosis not present

## 2016-03-01 DIAGNOSIS — J449 Chronic obstructive pulmonary disease, unspecified: Secondary | ICD-10-CM | POA: Diagnosis not present

## 2016-03-01 DIAGNOSIS — R058 Other specified cough: Secondary | ICD-10-CM

## 2016-03-01 DIAGNOSIS — J320 Chronic maxillary sinusitis: Secondary | ICD-10-CM

## 2016-03-01 LAB — CBC WITH DIFFERENTIAL/PLATELET
Basophils Absolute: 0 10*3/uL (ref 0.0–0.1)
Basophils Relative: 0.4 % (ref 0.0–3.0)
Eosinophils Absolute: 0.1 10*3/uL (ref 0.0–0.7)
Eosinophils Relative: 1.2 % (ref 0.0–5.0)
HCT: 44.4 % (ref 36.0–46.0)
Hemoglobin: 15 g/dL (ref 12.0–15.0)
Lymphocytes Relative: 33.7 % (ref 12.0–46.0)
Lymphs Abs: 2.4 10*3/uL (ref 0.7–4.0)
MCHC: 33.7 g/dL (ref 30.0–36.0)
MCV: 90.3 fl (ref 78.0–100.0)
Monocytes Absolute: 0.8 10*3/uL (ref 0.1–1.0)
Monocytes Relative: 11.2 % (ref 3.0–12.0)
Neutro Abs: 3.8 10*3/uL (ref 1.4–7.7)
Neutrophils Relative %: 53.5 % (ref 43.0–77.0)
Platelets: 252 10*3/uL (ref 150.0–400.0)
RBC: 4.91 Mil/uL (ref 3.87–5.11)
RDW: 14 % (ref 11.5–15.5)
WBC: 7 10*3/uL (ref 4.0–10.5)

## 2016-03-01 LAB — BRAIN NATRIURETIC PEPTIDE: Pro B Natriuretic peptide (BNP): 45 pg/mL (ref 0.0–100.0)

## 2016-03-01 LAB — BASIC METABOLIC PANEL
BUN: 10 mg/dL (ref 6–23)
CO2: 30 mEq/L (ref 19–32)
Calcium: 9.6 mg/dL (ref 8.4–10.5)
Chloride: 106 mEq/L (ref 96–112)
Creatinine, Ser: 0.9 mg/dL (ref 0.40–1.20)
GFR: 70.17 mL/min (ref >60.00)
Glucose, Bld: 90 mg/dL (ref 70–99)
Potassium: 4.3 mEq/L (ref 3.5–5.1)
Sodium: 142 mEq/L (ref 135–145)

## 2016-03-01 LAB — TSH: TSH: 1.19 u[IU]/mL (ref 0.35–4.50)

## 2016-03-01 MED ORDER — MOMETASONE FURO-FORMOTEROL FUM 100-5 MCG/ACT IN AERO: INHALATION_SPRAY | RESPIRATORY_TRACT | Status: DC

## 2016-03-01 NOTE — Progress Notes (Signed)
Quick Note:  Spoke with pt and notified of results per Dr. Wert. Pt verbalized understanding and denied any questions.  ______ 

## 2016-03-01 NOTE — Progress Notes (Signed)
Subjective:   Patient ID: Tracy Gray, female    DOB: 1966-08-23,     MRN: [id number]    Brief patient profile:  35 yowf active smoker "sick"  since September 2016 with ear pain, nasal congestion, cough and sob rx per Dr Tracy Gray with multiple abx and prednisone but no better then to ER > still only  some better  referred to pulmonary clinic 02/02/2016 for copd eval by EDP    History of Present Illness  02/02/2016 1st [facility] office visit/ Tracy Gray   Chief Complaint  Patient presents with  . Pulmonary Consult    Referred by [facility] after ED visit on 12/17/16. Pt states that she was dxed with COPD "years ago"- c/o increased SOB and cough for the past few months.  Cough is prod with clear to green sputum. She is SOB with or without any exertion. She is using albuterol 4 x per day on average.   symptoms came on abruptly p "virus" in Sept 2016  and consistent of variable sob are worse after supper assoc with choking sensation / sore throat and overt HB and persistent Doe x car to house with groceries every time. Cough is worse in am's, minimally productive, only occ green now. rec   sinus ct > pos sinusitis > levaquin x 10   Pantoprazole (protonix) 40 mg   Take  30-60 min before first meal of the day and Zantac  @  bedtime until return to office -  GERD  Add: did not go to lab/ ok to complete when returns     03/01/2016  f/u ov/Tracy Gray re: acute/ chronic sinusitis s/p 10 d levaquin Chief Complaint  Patient presents with  . Follow-up    Breathing has slightly improved. She states chest tightness has resolved. She is still coughing with green sputum. Using albuterol 3 x daily on average.   waking up each am and need to use saba immediately despite singulair maint rx and green mucus despite levaquin   No obvious day to day or daytime variability or assoc  cp or chest tightness, subjective wheeze or overt sinus or hb symptoms. No unusual exp hx or h/o childhood pna/ asthma  or knowledge of premature birth.  Sleeping ok without nocturnal  or early am exacerbation  of respiratory  c/o's or need for noct saba. Also denies any obvious fluctuation of symptoms with weather or environmental changes or other aggravating or alleviating factors except as outlined above   Current Medications, Allergies, Complete Past Medical History, Past Surgical History, Family History, and Social History were reviewed in [employer] record.  ROS  The following are not active complaints unless bolded sore throat, dysphagia, dental problems, itching, sneezing,  nasal congestion or excess/ purulent secretions, ear ache,   fever, chills, sweats, unintended wt loss, classically pleuritic or exertional cp, hemoptysis,  orthopnea pnd or leg swelling, presyncope, palpitations, abdominal pain, anorexia, nausea, vomiting, diarrhea  or change in bowel or bladder habits, change in stools or urine, dysuria,hematuria,  rash, arthralgias, visual complaints, headache, numbness, weakness or ataxia or problems with walking or coordination,  change in mood/affect or memory.                       Objective:   Physical Exam  Hoarse amb wf nad  / still somewhat congested sounding cough   Wt Readings from Last 3 Encounters:  03/01/16 157 lb (71.215 kg)  02/02/16 157 lb (  71.215 kg)  10/09/12 142 lb (64.411 kg)    Vital signs reviewed   HEENT: nl dentition, turbinates, and oropharynx. Nl external ear canals without cough reflex   NECK :  without JVD/Nodes/TM/ nl carotid upstrokes bilaterally   LUNGS: no acc muscle use,  Nl contour chest which is clear to A and P bilaterally without cough on insp or exp maneuvers   CV:  RRR  no s3 or murmur or increase in P2, no edema   ABD:  soft and nontender with nl inspiratory excursion in the supine position. No bruits or organomegaly, bowel sounds nl  MS:  Nl gait/ ext warm without deformities, calf tenderness, cyanosis or  clubbing No obvious joint restrictions   SKIN: warm and dry without lesions    NEURO:  alert, approp, nl sensorium with  no motor deficits   CXR PA and Lateral:   03/01/2016 :    I personally reviewed images and agree with radiology impression as follows:   Stable bilateral upper lobe emphysematous disease. No acute cardiopulmonary abnormality seen.   Labs ordered/ reviewed:     Chemistry      Component Value Date/Time   NA 142 03/01/2016 1103   K 4.3 03/01/2016 1103   CL 106 03/01/2016 1103   CO2 30 03/01/2016 1103   BUN 10 03/01/2016 1103   CREATININE 0.90 03/01/2016 1103      Component Value Date/Time   CALCIUM 9.6 03/01/2016 1103        Lab Results  Component Value Date   WBC 7.0 03/01/2016   HGB 15.0 03/01/2016   HCT 44.4 03/01/2016   MCV 90.3 03/01/2016   PLT 252.0 03/01/2016        Lab Results  Component Value Date   TSH 1.19 03/01/2016     Lab Results  Component Value Date   PROBNP 45.0 03/01/2016                        Assessment & Plan:

## 2016-03-01 NOTE — Patient Instructions (Addendum)
Please see patient coordinator before you leave today  to schedule  ENT eval next   Please remember to go to the lab and x-ray department downstairs for your tests - we will call you with the results when they are available.  Plan A = Automatic =  dulera 100 Take 2 puffs first thing in am and then another 2 puffs about 12 hours later.   Plan B = Backup Only use your albuterol as a rescue medication to be used if you can't catch your breath by resting or doing a relaxed purse lip breathing pattern.  - The less you use it, the better it will work when you need it. - Ok to use up to 2 puffs  every 4 hours if you must but call for appointment if use goes up over your usual need - Don't leave home without it !!  (think of it like the spare tire for your car)   Plan C = Crisis - only use your albuterol nebulizer if you first try Plan B and it fails to help > ok to use the nebulizer up to every 4 hours but if start needing it regularly call for immediate appointment   Please schedule a follow up office visit in 6 weeks, call sooner if needed with pfts - When return bring your medications in 2 separate bags, the ones you take no matter(automatically)  what vs the as needed (only when you feel you need them)

## 2016-03-03 NOTE — Assessment & Plan Note (Signed)
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    Please see instructions for details which were reviewed in writing and the patient given a copy highlighting the part that I personally wrote and discussed at today's ov.

## 2016-03-03 NOTE — Assessment & Plan Note (Addendum)
>   3 min discussion I reviewed the Fletcher curve with the patient that basically indicates  if you quit smoking when your best day FEV1 is still well preserved (as is clearly still  the case here)  it is highly unlikely you will progress to severe disease and informed the patient there was no medication on the market that has proven to alter the curve/ its downward trajectory  or the likelihood of progression of their disease.  Therefore stopping smoking and maintaining abstinence is the most important aspect of care, not choice of inhalers or for that matter, doctors.

## 2016-03-03 NOTE — Assessment & Plan Note (Signed)
Sinus CT 02/06/2016 > Maxillary sinusitis> rx levaquin 500  daily x 10 days> min improvement 03/01/2016 > referred to ENT  Allergy profile 02/02/2016 >  Eos 0.1 /  IgE  Pending   Since allergic to pcn but still coughing up green mucus each am strongly suspect ongoing pnds/sinusitis and would like to have her eval by ENT next as risk generating resistant orgs/ [place] from prolonged abx in this setting

## 2016-03-05 ENCOUNTER — Telehealth: Payer: Self-pay | Admitting: Internal Medicine

## 2016-03-05 LAB — RESPIRATORY ALLERGY PROFILE REGION II ~~LOC~~
Allergen, Cedar tree, t12: <0.1 kU/L
Allergen, Comm Silver Birch, t9: <0.1 kU/L
Allergen, Cottonwood, t14: <0.1 kU/L
Allergen, D pternoyssinus,d7: <0.1 kU/L
Allergen, Mouse Urine Protein, e78: <0.1 kU/L
Allergen, Mulberry, t76: <0.1 kU/L
Allergen, Oak,t7: <0.1 kU/L
Alternaria Alternata: <0.1 kU/L
Aspergillus fumigatus, m3: <0.1 kU/L
Bermuda Grass: <0.1 kU/L
Box Elder IgE: <0.1 kU/L
Cat Dander: <0.1 kU/L
Cladosporium Herbarum: <0.1 kU/L
Cockroach: <0.1 kU/L
Common Ragweed: <0.1 kU/L
D. farinae: <0.1 kU/L
Dog Dander: <0.1 kU/L
Elm IgE: <0.1 kU/L
IgE (Immunoglobulin E), Serum: 8 kU/L (ref <115)
Johnson Grass: <0.1 kU/L
Pecan/Hickory Tree IgE: <0.1 kU/L
Penicillium Notatum: <0.1 kU/L
Rough Pigweed  IgE: <0.1 kU/L
Sheep Sorrel IgE: <0.1 kU/L
Timothy Grass: <0.1 kU/L

## 2016-03-05 LAB — FOOD ALLERGY PROFILE
Allergen, Salmon, f41: <0.1 kU/L
Almonds: <0.1 kU/L
Cashew IgE: <0.1 kU/L
Egg White IgE: <0.1 kU/L
Fish Cod: <0.1 kU/L
Hazelnut: <0.1 kU/L
Milk IgE: <0.1 kU/L
Peanut IgE: <0.1 kU/L
Scallop IgE: <0.1 kU/L
Sesame Seed f10: <0.1 kU/L
Shrimp IgE: <0.1 kU/L
Soybean IgE: <0.1 kU/L
Tuna IgE: <0.1 kU/L
Walnut: <0.1 kU/L
Wheat IgE: <0.1 kU/L

## 2016-03-05 NOTE — Telephone Encounter (Signed)
Per 03/01/16 OV: Patient Instructions       Please see patient coordinator before you leave today  to schedule  ENT eval next   Please remember to go to the lab and x-ray department downstairs for your tests - we will call you with the results when they are available.  Plan A = Automatic =  dulera 100 Take 2 puffs first thing in am and then another 2 puffs about 12 hours later.   Plan B = Backup Only use your albuterol as a rescue medication to be used if you can't catch your breath by resting or doing a relaxed purse lip breathing pattern.   - The less you use it, the better it will work when you need it. - Ok to use up to 2 puffs  every 4 hours if you must but call for appointment if use goes up over your usual need - Don't leave home without it !!  (think of it like the spare tire for your car)   Plan C = Crisis - only use your albuterol nebulizer if you first try Plan B and it fails to help > ok to use the nebulizer up to every 4 hours but if start needing it regularly call for immediate appointment   Please schedule a follow up office visit in 6 weeks, call sooner if needed with pfts - When return bring your medications in 2 separate bags, the ones you take no matter(automatically)  what vs the as needed (only when you feel you need them)    --  [employer] calling stating dulera needs PA. The covered alternatives are breo and advair. Please advise Dr. Melvyn Novas thanks

## 2016-03-06 MED ORDER — FLUTICASONE-SALMETEROL 115-21 MCG/ACT IN AERO: 2.0000 | INHALATION_SPRAY | Freq: Two times a day (BID) | RESPIRATORY_TRACT | Status: DC

## 2016-03-06 NOTE — Progress Notes (Signed)
Quick Note:  Called and spoke with pt. Reviewed results and recs. Pt voiced understanding and had no further questions. ______ 

## 2016-03-06 NOTE — Telephone Encounter (Signed)
Called spoke with pt. She has never tried the advair. This has been sent in. Nothing further needed

## 2016-03-06 NOTE — Telephone Encounter (Signed)
Unless she's tried and failed advair hfa they will require her to try the advair 115 2bid but can always give her a sample of the dulera 100 if needed until can sort out

## 2016-03-06 NOTE — Telephone Encounter (Signed)
LVM for pt to return call to discuss if she has ever tried Advair.

## 2016-03-22 DIAGNOSIS — J302 Other seasonal allergic rhinitis: Secondary | ICD-10-CM | POA: Insufficient documentation

## 2016-03-22 DIAGNOSIS — J342 Deviated nasal septum: Secondary | ICD-10-CM | POA: Insufficient documentation

## 2016-03-22 DIAGNOSIS — J343 Hypertrophy of nasal turbinates: Secondary | ICD-10-CM | POA: Insufficient documentation

## 2016-03-25 ENCOUNTER — Encounter: Payer: Self-pay | Admitting: Gastroenterology

## 2016-04-11 ENCOUNTER — Ambulatory Visit (INDEPENDENT_AMBULATORY_CARE_PROVIDER_SITE_OTHER): Payer: Medicare Other | Admitting: Internal Medicine

## 2016-04-11 DIAGNOSIS — J449 Chronic obstructive pulmonary disease, unspecified: Secondary | ICD-10-CM | POA: Diagnosis not present

## 2016-04-11 DIAGNOSIS — J45909 Unspecified asthma, uncomplicated: Secondary | ICD-10-CM | POA: Diagnosis not present

## 2016-04-11 LAB — PULMONARY FUNCTION TEST
DL/VA % pred: 66 %
DL/VA: 3.17 ml/min/mmHg/L
DLCO cor % pred: 42 %
DLCO cor: 10.42 ml/min/mmHg
DLCO unc % pred: 42 %
DLCO unc: 10.39 ml/min/mmHg
FEF 25-75 Post: 0.81 L/sec
FEF 25-75 Pre: 0.42 L/sec
FEF2575-%Change-Post: 94 %
FEF2575-%Pred-Post: 29 %
FEF2575-%Pred-Pre: 15 %
FEV1-%Change-Post: 13 %
FEV1-%Pred-Post: 47 %
FEV1-%Pred-Pre: 41 %
FEV1-Post: 1.32 L
FEV1-Pre: 1.16 L
FEV1FVC-%Change-Post: 0 %
FEV1FVC-%Pred-Pre: 75 %
FEV6-%Change-Post: 20 %
FEV6-%Pred-Post: 62 %
FEV6-%Pred-Pre: 51 %
FEV6-Post: 2.13 L
FEV6-Pre: 1.76 L
FEV6FVC-%Change-Post: 5 %
FEV6FVC-%Pred-Post: 100 %
FEV6FVC-%Pred-Pre: 94 %
FVC-%Change-Post: 14 %
FVC-%Pred-Post: 62 %
FVC-%Pred-Pre: 54 %
FVC-Post: 2.19 L
FVC-Pre: 1.92 L
Post FEV1/FVC ratio: 60 %
Post FEV6/FVC ratio: 97 %
Pre FEV1/FVC ratio: 61 %
Pre FEV6/FVC Ratio: 92 %
RV % pred: 201 %
RV: 3.64 L
TLC % pred: 110 %
TLC: 5.57 L

## 2016-04-11 NOTE — Progress Notes (Signed)
PFT done today. 

## 2016-04-15 ENCOUNTER — Telehealth: Payer: Self-pay | Admitting: Internal Medicine

## 2016-04-15 ENCOUNTER — Ambulatory Visit (INDEPENDENT_AMBULATORY_CARE_PROVIDER_SITE_OTHER): Payer: Medicare Other | Admitting: Internal Medicine

## 2016-04-15 ENCOUNTER — Encounter: Payer: Self-pay | Admitting: Internal Medicine

## 2016-04-15 VITALS — BP 114/74 | HR 94 | Ht 64.0 in | Wt 158.8 lb

## 2016-04-15 DIAGNOSIS — Z720 Tobacco use: Secondary | ICD-10-CM

## 2016-04-15 DIAGNOSIS — J449 Chronic obstructive pulmonary disease, unspecified: Secondary | ICD-10-CM | POA: Diagnosis not present

## 2016-04-15 DIAGNOSIS — F17210 Nicotine dependence, cigarettes, uncomplicated: Secondary | ICD-10-CM

## 2016-04-15 MED ORDER — BUDESONIDE-FORMOTEROL FUMARATE 160-4.5 MCG/ACT IN AERO: INHALATION_SPRAY | RESPIRATORY_TRACT | Status: DC

## 2016-04-15 NOTE — Telephone Encounter (Signed)
Finish up sample first to see what difference if any it makes then resume advair for now

## 2016-04-15 NOTE — Progress Notes (Signed)
Subjective:   Patient ID: Tracy Gray, female    DOB: 06-16-1966,     MRN: [id number]    Brief patient profile:  74 yowf active smoker "sick"  since September 2016 with ear pain, nasal congestion, cough and sob rx per Dr Janeice Robinson with multiple abx and prednisone but no better then to ER > still only  some better  referred to pulmonary clinic 02/02/2016 for copd eval by EDP    History of Present Illness  02/02/2016 1st [facility] office visit/ Tracy Gray   Chief Complaint  Patient presents with  . Pulmonary Consult    Referred by [facility] after ED visit on 12/17/16. Pt states that she was dxed with COPD "years ago"- c/o increased SOB and cough for the past few months.  Cough is prod with clear to green sputum. She is SOB with or without any exertion. She is using albuterol 4 x per day on average.   symptoms came on abruptly p "virus" in Sept 2016  and consistent of variable sob are worse after supper assoc with choking sensation / sore throat and overt HB and persistent Doe x car to house with groceries every time. Cough is worse in am's, minimally productive, only occ green now. rec   sinus ct > pos sinusitis > levaquin x 10   Pantoprazole (protonix) 40 mg   Take  30-60 min before first meal of the day and Zantac  @  bedtime until return to office -  GERD  Add: did not go to lab/ ok to complete when returns     03/01/2016  f/u ov/Tracy Gray re: acute/ chronic sinusitis s/p 10 d levaquin Chief Complaint  Patient presents with  . Follow-up    Breathing has slightly improved. She states chest tightness has resolved. She is still coughing with green sputum. Using albuterol 3 x daily on average.   waking up each am and need to use saba immediately despite singulair maint rx and green mucus despite levaquin  rec Please see patient coordinator before you leave today  to schedule  ENT eval next  Plan A = Automatic =  dulera 100 Take 2 puffs first thing in am and then another 2 puffs  about 12 hours later.  Plan B = Backup Only use your albuterol as a rescue medication Plan C = Crisis - only use your albuterol nebulizer if you first try Plan B and it fails to help > ok to use the nebulizer up to every 4 hours but if start needing it regularly call for immediate appointment    04/15/2016  f/u ov/Tracy Gray re: GOLD III/ still smoking/ changed to  advair 115 due  To insurance  Chief Complaint  Patient presents with  . Follow-up    PFT done 04/11/16. She c/o "feels like I don't have any air" x 3-4 days. She states that she also has "butterfly in my chest".  She is using albuterol inhaler 3 x daily on average and neb with atrovent  3 x per wk on average.   now on chantix / duoneb rarely and planning to regroup Dr  Wilburn Cornelia 4/25 Feels worse off symbicort and on advair    No obvious day to day or daytime variability or assoc  cp or chest tightness, subjective wheeze or overt sinus or hb symptoms. No unusual exp hx or h/o childhood pna/ asthma or knowledge of premature birth.  Sleeping ok without nocturnal  or early am exacerbation  of respiratory  c/o's or need for noct saba. Also denies any obvious fluctuation of symptoms with weather or environmental changes or other aggravating or alleviating factors except as outlined above   Current Medications, Allergies, Complete Past Medical History, Past Surgical History, Family History, and Social History were reviewed in [employer] record.  ROS  The following are not active complaints unless bolded sore throat, dysphagia, dental problems, itching, sneezing,  nasal congestion or excess/ purulent secretions, ear ache,   fever, chills, sweats, unintended wt loss, classically pleuritic or exertional cp, hemoptysis,  orthopnea pnd or leg swelling, presyncope, palpitations, abdominal pain, anorexia, nausea, vomiting, diarrhea  or change in bowel or bladder habits, change in stools or urine, dysuria,hematuria,  rash,  arthralgias, visual complaints, headache, numbness, weakness or ataxia or problems with walking or coordination,  change in mood/affect or memory.                    Objective:   Physical Exam  Hoarse amb wf nad  / minimally  congested sounding cough    04/15/2016      159   03/01/16 157 lb (71.215 kg)  02/02/16 157 lb (71.215 kg)  10/09/12 142 lb (64.411 kg)    Vital signs reviewed   HEENT: nl dentition, turbinates, and oropharynx. Nl external ear canals without cough reflex   NECK :  without JVD/Nodes/TM/ nl carotid upstrokes bilaterally   LUNGS: no acc muscle use,  Nl contour chest which is clear to A and P bilaterally without cough on insp or exp maneuvers   CV:  RRR  no s3 or murmur or increase in P2, no edema   ABD:  soft and nontender with nl inspiratory excursion in the supine position. No bruits or organomegaly, bowel sounds nl  MS:  Nl gait/ ext warm without deformities, calf tenderness, cyanosis or clubbing No obvious joint restrictions   SKIN: warm and dry without lesions    NEURO:  alert, approp, nl sensorium with  no motor deficits              Assessment & Plan:

## 2016-04-15 NOTE — Telephone Encounter (Signed)
I spoke with the pt and notified of recs per MW  She verbalized understanding  Nothing further needed

## 2016-04-15 NOTE — Patient Instructions (Signed)
Plan A = Automatic = Symbicort 160 Take 2 puffs first thing in am and then another 2 puffs about 12 hours later.   Plan B = Backup Only use your albuterol as a rescue medication to be used if you can't catch your breath by resting or doing a relaxed purse lip breathing pattern.  - The less you use it, the better it will work when you need it. - Ok to use the inhaler up to 2 puffs  every 4 hours if you must but call for appointment if use goes up over your usual need - Don't leave home without it !!  (think of it like the spare tire for your car)   Plan C = Crisis - only use your albuterol/ipatropium  nebulizer if you first try Plan B and it fails to help > ok to use the nebulizer up to every 4 hours but if start needing it regularly call for immediate appointment  The key is to stop smoking completely before smoking completely stops you - it's the most important aspect of your care   Please schedule a follow up visit in 3 months but call sooner if needed

## 2016-04-15 NOTE — Telephone Encounter (Signed)
Called [facility] and spoke with Erasmo Downer  She states Symbicort not covered Her ins prefers advair or Breo  Please advise thanks   Last avs:  Plan A = Automatic = Symbicort 160 Take 2 puffs first thing in am and then another 2 puffs about 12 hours later.   Plan B = Backup Only use your albuterol as a rescue medication to be used if you can't catch your breath by resting or doing a relaxed purse lip breathing pattern.  - The less you use it, the better it will work when you need it. - Ok to use the inhaler up to 2 puffs every 4 hours if you must but call for appointment if use goes up over your usual need - Don't leave home without it !! (think of it like the spare tire for your car)   Plan C = Crisis - only use your albuterol/ipatropium nebulizer if you first try Plan B and it fails to help > ok to use the nebulizer up to every 4 hours but if start needing it regularly call for immediate appointment  The key is to stop smoking completely before smoking completely stops you - it's the most important aspect of your care   Please schedule a follow up visit in 3 months but call sooner if needed

## 2016-04-16 ENCOUNTER — Encounter: Payer: Self-pay | Admitting: Internal Medicine

## 2016-04-16 NOTE — Assessment & Plan Note (Signed)
>   3 m discussion  Discussed the risks and costs (both direct and indirect)  of smoking relative to the benefits of quitting but patient unwilling to commit at this point to a specific quit date.    Reviewed use of  chantix or referral to our [employer] program when the patient is ready.

## 2016-04-16 NOTE — Assessment & Plan Note (Addendum)
03/01/2016    try dulera 100  2bid  - PFT's  04/11/2016  FEV1 1.32 (47 % ) ratio 60  p 13 % improvement from saba p advair prior to study with DLCO  42/42 % corrects to 66 % for alv volume    - 04/15/2016  After extensive coaching HFA effectiveness =    75% > try symbicort 160 2bid   DDX of  difficult airways management almost all start with A and  include Adherence, Ace Inhibitors, Acid Reflux, Active Sinus Disease, Alpha 1 Antitripsin deficiency, Anxiety masquerading as Airways dz,  ABPA,  Allergy(esp in young), Aspiration (esp in elderly), Adverse effects of meds,  Active smokers, A bunch of PE's (a small clot burden can't cause this syndrome unless there is already severe underlying pulm or vascular dz with poor reserve) plus two Bs  = Bronchiectasis and Beta blocker use..and one C= CHF   Adherence is always the initial "prime suspect" and is a multilayered concern that requires a "trust but verify" approach in every patient - starting with knowing how to use medications, especially inhalers, correctly, keeping up with refills and understanding the fundamental difference between maintenance and prns vs those medications only taken for a very short course and then stopped and not refilled.  - The proper method of use, as well as anticipated side effects, of a metered-dose inhaler are discussed and demonstrated to the patient. Improved effectiveness after extensive coaching during this visit to a level of approximately 75 % from a baseline of 50 % > rechallenge with symbicort 160 2bid to see if this is what really makes a difference  Active smoking (see separate a/p)   ? Anxiety > usually at the bottom of this list of usual suspects but should be higher on this pt's based on H and P and note already on psychotropics (klonopin)   ? Allergy > excluded with neg allergy profile 03/01/16  ? Active sinus dz > may need sinus surgery > encouraged to keep all appts with [name] and follow his advice   I  had an extended discussion with the patient reviewing all relevant studies completed to date and  lasting 15 to 20 minutes of a 25 minute visit    Each maintenance medication was reviewed in detail including most importantly the difference between maintenance and prns and under what circumstances the prns are to be triggered using an action plan format that is not reflected in the computer generated alphabetically organized AVS.    Please see instructions for details which were reviewed in writing and the patient given a copy highlighting the part that I personally wrote and discussed at today's ov.    Marland Kitchen

## 2016-04-29 ENCOUNTER — Other Ambulatory Visit: Payer: Self-pay

## 2016-04-29 DIAGNOSIS — Z1231 Encounter for screening mammogram for malignant neoplasm of breast: Secondary | ICD-10-CM

## 2016-05-01 ENCOUNTER — Other Ambulatory Visit: Payer: Self-pay | Admitting: Internal Medicine

## 2016-05-02 ENCOUNTER — Encounter: Payer: Self-pay | Admitting: Internal Medicine

## 2016-05-09 ENCOUNTER — Telehealth: Payer: Self-pay | Admitting: Internal Medicine

## 2016-05-09 ENCOUNTER — Encounter: Payer: Self-pay | Admitting: Pulmonary Disease

## 2016-05-09 ENCOUNTER — Ambulatory Visit (INDEPENDENT_AMBULATORY_CARE_PROVIDER_SITE_OTHER): Payer: Medicare Other | Admitting: Pulmonary Disease

## 2016-05-09 VITALS — BP 124/78 | HR 91 | Ht 64.0 in | Wt 158.2 lb

## 2016-05-09 DIAGNOSIS — G4710 Hypersomnia, unspecified: Secondary | ICD-10-CM

## 2016-05-09 DIAGNOSIS — G4733 Obstructive sleep apnea (adult) (pediatric): Secondary | ICD-10-CM

## 2016-05-09 NOTE — Telephone Encounter (Signed)
Pt seen today.  Please on what to send for white spots in mouth.

## 2016-05-09 NOTE — Progress Notes (Signed)
   Subjective:    Patient ID: Tracy Gray, female    DOB: Oct 25, 1966, 50 y.o.   MRN: [id number]  HPI    Review of Systems  Constitutional: Positive for unexpected weight change. Negative for fever.  HENT: Positive for congestion, ear pain and sore throat. Negative for dental problem, nosebleeds, postnasal drip, rhinorrhea, sinus pressure, sneezing and trouble swallowing.   Eyes: Negative for redness and itching.  Respiratory: Positive for apnea, cough and shortness of breath. Negative for chest tightness and wheezing.   Cardiovascular: Positive for palpitations. Negative for leg swelling.  Gastrointestinal: Negative for nausea and vomiting.  Genitourinary: Negative for dysuria.  Musculoskeletal: Negative for joint swelling.  Skin: Negative for rash.  Neurological: Positive for headaches.  Hematological: Does not bruise/bleed easily.  Psychiatric/Behavioral: Negative for dysphoric mood. The patient is nervous/anxious.        Objective:   Physical Exam        Assessment & Plan:

## 2016-05-09 NOTE — Progress Notes (Signed)
Subjective:    Patient ID: Tracy Gray, female    DOB: May 09, 1966, 50 y.o.   MRN: [id number]  HPI  Chief Complaint  Patient presents with  . Sleep Consult    referred by Dr Lorene Dy to evaluate for OSA.  reports she had a sleep study about 28yrs ago that showed severe OSA.   50 year old woman with moderate COPD (MW) presents for evaluation of sleep-disordered breathing. Epworth sleepiness score is 15 She lives in a trailer with her family and grandkids and loud snoring has been noted by family members. She is frequent nocturnal awakenings and wakes up feeling tired. She reports excessive daytime fatigue throughout the day. She then reports difficulty falling asleep-she was given Klonopin and this was increased to 2 mg daily at bedtime and this made her feel drunk in the mornings. Bedtime is around 11 PM, sleep latency is prolonged about an hour, she sleeps on her side with 2 pillows including a body pillow and keeps her head raised, reports frequent arousals and is out of bed by 6 AM feeling tired with headaches and dryness of mouth. She's gained about 30 pounds over the last 5 years. She reports having a sleep study in more than 10 years ago and was told that she has sleep apnea. She reports body spasms for which she takes muscle relaxants round-the-clock and mexiletine has been added. She has been evaluated at [facility] in [place] for this She is disabled  Significant tests/ events   04/11/2016 FEV1 1.32 (47 % ) ratio 60   Past Medical History  Diagnosis Date  . Degenerative disc disease   . COPD (chronic obstructive pulmonary disease) ([place])   . Multiple sclerosis ([place])   . Nerve damage   . OSA (obstructive sleep apnea)     not on CPAP     Past Surgical History  Procedure Laterality Date  . Tubal ligation    . Sinus irrigation    . Temporomandibular joint surgery    . Exploratory laparotomy    . Ectopic pregnancy surgery      Allergies  Allergen Reactions  .  Bee Venom Anaphylaxis  . Penicillins Anaphylaxis    Social History   Social History  . Marital Status: Legally Separated    Spouse Name: N/A  . Number of Children: N/A  . Years of Education: N/A   Occupational History  . Unemployed    Social History Main Topics  . Smoking status: Former Smoker -- 2.00 packs/day for 42 years    Types: Cigarettes    Quit date: 02/16/2016  . Smokeless tobacco: Never Used     Comment: uses e-cigs  . Alcohol Use: No  . Drug Use: No  . Sexual Activity: Not on file   Other Topics Concern  . Not on file   Social History Narrative     Family History  Problem Relation Age of Onset  . Emphysema Mother     never smoked  . Emphysema Father     smoked  . Breast cancer Paternal Aunt   . Asthma Daughter   . Asthma Son   . Asthma Grandchild      Review of Systems Constitutional: negative for anorexia, fevers and sweats  Eyes: negative for irritation, redness and visual disturbance  Ears, nose, mouth, throat, and face: negative for earaches, epistaxis, nasal congestion and sore throat  Respiratory: negative for cough, dyspnea on exertion, sputum and wheezing  Cardiovascular: negative for chest pain, dyspnea, lower extremity  edema, orthopnea, palpitations and syncope  Gastrointestinal: negative for abdominal pain, constipation, diarrhea, melena, nausea and vomiting  Genitourinary:negative for dysuria, frequency and hematuria  Hematologic/lymphatic: negative for bleeding, easy bruising and lymphadenopathy  Musculoskeletal:negative for arthralgias, muscle weakness and stiff joints  Neurological: negative for coordination problems, gait problems, headaches and weakness  Endocrine: negative for diabetic symptoms including polydipsia, polyuria and weight loss     Objective:   Physical Exam  Gen. Pleasant, obese, in no distress ENT - no lesions, no post nasal drip Neck: No JVD, no thyromegaly, no carotid bruits Lungs: no use of accessory  muscles, no dullness to percussion, decreased without rales or rhonchi  Cardiovascular: Rhythm regular, heart sounds  normal, no murmurs or gallops, no peripheral edema Musculoskeletal: No deformities, no cyanosis or clubbing , no tremors       Assessment & Plan:

## 2016-05-09 NOTE — Patient Instructions (Signed)
Schedule sleep study

## 2016-05-10 DIAGNOSIS — G4733 Obstructive sleep apnea (adult) (pediatric): Secondary | ICD-10-CM | POA: Insufficient documentation

## 2016-05-10 MED ORDER — NYSTATIN 100000 UNIT/ML MT SUSP: OROMUCOSAL | Status: DC

## 2016-05-10 NOTE — Assessment & Plan Note (Signed)
Given excessive daytime somnolence, narrow pharyngeal exam, witnessed apneas & loud snoring, obstructive sleep apnea is very likely & an overnight polysomnogram will be scheduled as a split study. The pathophysiology of obstructive sleep apnea , it's cardiovascular consequences & modes of treatment including CPAP were discused with the patient in detail & they evidenced understanding.  Since it is difficult to estimate her total sleep time would prefer to return polysomnogram to home study

## 2016-05-10 NOTE — Telephone Encounter (Signed)
Spoke with pt. States that RA stated yesterday that she had "white spots" on her tongue. RA though this was coming from her inhalers. A prescription was to be sent in for this and wasn't.  RA - please advise. Thanks.

## 2016-05-10 NOTE — Telephone Encounter (Signed)
Nystatin 5 ML by mouth swish and swallow twice daily for 7 days

## 2016-05-15 ENCOUNTER — Encounter: Payer: Medicare Other | Admitting: Gastroenterology

## 2016-05-22 ENCOUNTER — Ambulatory Visit
Admission: RE | Admit: 2016-05-22 | Discharge: 2016-05-22 | Disposition: A | Discharge status: Home / Self Care | Payer: Medicare Other | Source: Ambulatory Visit

## 2016-05-22 DIAGNOSIS — Z1231 Encounter for screening mammogram for malignant neoplasm of breast: Secondary | ICD-10-CM

## 2016-05-29 ENCOUNTER — Ambulatory Visit (HOSPITAL_BASED_OUTPATIENT_CLINIC_OR_DEPARTMENT_OTHER): Payer: Medicare Other | Attending: Pulmonary Disease | Admitting: Pulmonary Disease

## 2016-05-29 VITALS — Ht 64.0 in | Wt 158.0 lb

## 2016-05-29 DIAGNOSIS — R0683 Snoring: Secondary | ICD-10-CM | POA: Diagnosis not present

## 2016-05-29 DIAGNOSIS — G4710 Hypersomnia, unspecified: Secondary | ICD-10-CM | POA: Insufficient documentation

## 2016-05-29 DIAGNOSIS — R400 Somnolence: Secondary | ICD-10-CM | POA: Insufficient documentation

## 2016-05-29 DIAGNOSIS — G4733 Obstructive sleep apnea (adult) (pediatric): Secondary | ICD-10-CM | POA: Diagnosis present

## 2016-05-29 DIAGNOSIS — Z79899 Other long term (current) drug therapy: Secondary | ICD-10-CM | POA: Diagnosis not present

## 2016-05-30 ENCOUNTER — Telehealth: Payer: Self-pay | Admitting: Pulmonary Disease

## 2016-05-30 DIAGNOSIS — G4710 Hypersomnia, unspecified: Secondary | ICD-10-CM

## 2016-05-30 NOTE — Telephone Encounter (Signed)
PSG did not show any evidence of OSA  Would suggest MSLT to investigate for narcoelpsy as a cause of her daytime somnolence

## 2016-05-30 NOTE — Procedures (Signed)
Patient Name: Tracy Gray, Fails Date: 05/29/2016 Gender: Female D.O.B: [birth year] Age (years): 56 Referring Provider: Kara Mead MD, ABSM Height (inches): 64 Interpreting Physician: Kara Mead MD, ABSM Weight (lbs): 158 RPSGT: Gerhard Perches BMI: 27 MRN: [id number] Neck Size: 13.50   CLINICAL INFORMATION Sleep Study Type: NPSG Indication for sleep study: snoring & excessive daytime somnolence Epworth Sleepiness Score: 15   SLEEP STUDY TECHNIQUE As per the AASM Manual for the Scoring of Sleep and Associated Events v2.3 (April 2016) with a hypopnea requiring 4% desaturations. The channels recorded and monitored were frontal, central and occipital EEG, electrooculogram (EOG), submentalis EMG (chin), nasal and oral airflow, thoracic and abdominal wall motion, anterior tibialis EMG, snore microphone, electrocardiogram, and pulse oximetry.   MEDICATIONS Patient's medications include: ESTRADIOL, ACETAMINOPHEN, PANTOPRAZOLE SODIUM, RANITIDINE HCI, MELOXICAM, ADVAIR DISKUS, PROAIR HFA, BACOLFEN, MEXILITENE. Medications self-administered by patient during sleep study : No sleep medicine administered.   SLEEP ARCHITECTURE The study was initiated at 10:30:44 PM and ended at 4:41:16 AM. Sleep onset time was 10.9 minutes and the sleep efficiency was 92.2%. The total sleep time was 341.5 minutes. Stage REM latency was 128.5 minutes. The patient spent 2.20% of the night in stage N1 sleep, 71.74% in stage N2 sleep, 8.05% in stage N3 and 18.01% in REM. Alpha intrusion was absent. Supine sleep was 35.11%.   RESPIRATORY PARAMETERS The overall apnea/hypopnea index (AHI) was 0.0 per hour. There were 0 total apneas, including 0 obstructive, 0 central and 0 mixed apneas. There were 0 hypopneas and 15 RERAs. The AHI during Stage REM sleep was 0.0 per hour. AHI while supine was 0.0 per hour. The mean oxygen saturation was 92.68%. The minimum SpO2 during sleep was 89.00%. Soft snoring was  noted during this study.   CARDIAC DATA The 2 lead EKG demonstrated sinus rhythm. The mean heart rate was 76.47 beats per minute. Other EKG findings include: None.   LEG MOVEMENT DATA The total PLMS were 0 with a resulting PLMS index of 0.00. Associated arousal with leg movement index was 0.0 .   IMPRESSIONS - No significant obstructive sleep apnea occurred during this study (AHI = 0.0/h). - No significant central sleep apnea occurred during this study (CAI = 0.0/h). - The patient had minimal or no oxygen desaturation during the study (Min O2 = 89.00%) - The patient snored with Soft snoring volume. - No cardiac abnormalities were noted during this study. - Clinically significant periodic limb movements did not occur during sleep. No significant associated arousals.   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 - Sleep hygiene should be reviewed to assess factors that may improve sleep quality. - Weight management and regular exercise should be initiated or continued if appropriate. - Consider MSLT if narcolepsy is to be investigated   Kara Mead MD. FCCP. [facility]   05/30/2016

## 2016-05-30 NOTE — Telephone Encounter (Signed)
Patient notified of results.  Patient states that she was diagnosed with Sleep Apnea about 9 years ago at [facility] at [place], [place]  [phone].  She said for me to contact that office to obtain the Sleep Study.  Called the number given and the office is closed at this time.  Left detailed message on voicemail requesting copy of sleep study.  Patient states she is fine with doing MLST.  Ordered test,  Hold in my box until Sleep Study received.

## 2016-05-31 NOTE — Telephone Encounter (Signed)
lmtcb x1 for pt. She will need to come and sign a records release.

## 2016-05-31 NOTE — Telephone Encounter (Signed)
[place] needs a release to send [place] records  [place]   [phone]      Fax # [phone]

## 2016-06-03 DIAGNOSIS — G4710 Hypersomnia, unspecified: Secondary | ICD-10-CM

## 2016-06-04 NOTE — Telephone Encounter (Signed)
lmtcb x2 for pt. 

## 2016-06-05 ENCOUNTER — Other Ambulatory Visit: Payer: Self-pay | Admitting: Internal Medicine

## 2016-06-05 MED ORDER — FLUTICASONE-SALMETEROL 115-21 MCG/ACT IN AERO
2.0000 | INHALATION_SPRAY | Freq: Two times a day (BID) | RESPIRATORY_TRACT | Status: DC
Start: 2016-06-05 — End: 2017-11-05

## 2016-06-06 NOTE — Telephone Encounter (Signed)
Left message for patient to call back  

## 2016-06-06 NOTE — Telephone Encounter (Signed)
Called and advised patient that we need a release signed in order to obtain the Sleep Study. Patient states that she has an appointment downstairs in the morning and she will come up after her appointment and sign the release. Release has been completed and in my folder to have patient sign in the morning.

## 2016-06-06 NOTE — Telephone Encounter (Signed)
[phone], pt cb

## 2016-06-10 NOTE — Telephone Encounter (Signed)
Mailed release to patient for her to sign and return. Awaiting return of release to obtain sleep study.

## 2016-06-19 ENCOUNTER — Other Ambulatory Visit: Payer: Self-pay | Admitting: Internal Medicine

## 2016-06-27 NOTE — Telephone Encounter (Signed)
Reviewed PSG 06/2009 - wt 128 lbs AHI 14/h , predom hyoppneas,not corrected by CPAP, bilevel recommended

## 2016-06-30 ENCOUNTER — Encounter (HOSPITAL_BASED_OUTPATIENT_CLINIC_OR_DEPARTMENT_OTHER): Payer: Medicare Other

## 2016-07-08 ENCOUNTER — Encounter (HOSPITAL_BASED_OUTPATIENT_CLINIC_OR_DEPARTMENT_OTHER): Payer: Medicare Other

## 2016-07-15 ENCOUNTER — Ambulatory Visit (INDEPENDENT_AMBULATORY_CARE_PROVIDER_SITE_OTHER): Payer: Medicare Other | Admitting: Internal Medicine

## 2016-07-15 ENCOUNTER — Encounter: Payer: Self-pay | Admitting: Internal Medicine

## 2016-07-15 VITALS — BP 100/60 | HR 77 | Ht 64.0 in | Wt 152.0 lb

## 2016-07-15 DIAGNOSIS — J441 Chronic obstructive pulmonary disease with (acute) exacerbation: Secondary | ICD-10-CM

## 2016-07-15 DIAGNOSIS — J449 Chronic obstructive pulmonary disease, unspecified: Secondary | ICD-10-CM

## 2016-07-15 MED ORDER — AZITHROMYCIN 250 MG PO TABS: ORAL_TABLET | ORAL | 0 refills | Status: DC

## 2016-07-15 MED ORDER — PREDNISONE 10 MG PO TABS: ORAL_TABLET | ORAL | 0 refills | Status: DC

## 2016-07-15 NOTE — Patient Instructions (Addendum)
zpak / Prednisone 10 mg take  4 each am x 2 days,   2 each am x 2 days,  1 each am x 2 days and stop   When cough > mucienex or mucinex dm up to 1200 mg every 12 hours and increase zantac to where after you take it after bfast and after supper  Work on inhaler technique:  relax and gently blow all the way out then take a nice smooth deep breath back in, triggering the inhaler at same time you start breathing in.  Hold for up to 5 seconds if you can. Blow out thru nose. Rinse and gargle with water when done      Please schedule a follow up visit in 3 months but call sooner if needed

## 2016-07-15 NOTE — Progress Notes (Signed)
Subjective:   Patient ID: Tracy Gray, female    DOB: 1966-11-28,     MRN: [id number]    Brief patient profile:  77 yowf active smoker "sick"  since September 2016 with ear pain, nasal congestion, cough and sob rx per Tracy Tracy Gray with multiple abx and prednisone but no better then to ER > still only  some better  referred to pulmonary clinic 02/02/2016 for copd eval by EDP    History of Present Illness  02/02/2016 1st [facility] office visit/ Tracy Gray   Chief Complaint  Patient presents with  . Pulmonary Consult    Referred by [facility] after ED visit on 12/17/16. Pt states that she was dxed with COPD "years ago"- c/o increased SOB and cough for the past few months.  Cough is prod with clear to green sputum. She is SOB with or without any exertion. She is using albuterol 4 x per day on average.   symptoms came on abruptly p "virus" in Sept 2016  and consistent of variable sob are worse after supper assoc with choking sensation / sore throat and overt HB and persistent Doe x car to house with groceries every time. Cough is worse in am's, minimally productive, only occ green now. rec   sinus ct > pos sinusitis > levaquin x 10   Pantoprazole (protonix) 40 mg   Take  30-60 min before first meal of the day and Zantac  @  bedtime until return to office -  GERD  Add: did not go to lab/ ok to complete when returns     03/01/2016  f/u ov/Tracy Gray re: acute/ chronic sinusitis s/p 10 d levaquin Chief Complaint  Patient presents with  . Follow-up    Breathing has slightly improved. She states chest tightness has resolved. She is still coughing with green sputum. Using albuterol 3 x daily on average.   waking up each am and need to use saba immediately despite singulair maint rx and green mucus despite levaquin  rec Please see patient coordinator before you leave today  to schedule  ENT eval next  Plan A = Automatic =  dulera 100 Take 2 puffs first thing in am and then another 2 puffs  about 12 hours later.  Plan B = Backup Only use your albuterol as a rescue medication Plan C = Crisis - only use your albuterol nebulizer if you first try Plan B and it fails to help > ok to use the nebulizer up to every 4 hours but if start needing it regularly call for immediate appointment    04/15/2016  f/u ov/Tracy Gray re: GOLD III/ still smoking/ changed to  advair 115 due  To insurance  Chief Complaint  Patient presents with  . Follow-up    PFT done 04/11/16. She c/o "feels like I don't have any air" x 3-4 days. She states that she also has "butterfly in my chest".  She is using albuterol inhaler 3 x daily on average and neb with atrovent  3 x per wk on average.   now on chantix / duoneb rarely and planning to regroup Tracy Gray 4/25 Feels worse off symbicort and on advair  rec Plan A = Automatic = Symbicort 160 Take 2 puffs first thing in am and then another 2 puffs about 12 hours later.  Plan B = Backup Only use your albuterol as a rescue medication  Plan C = Crisis - only use your albuterol/ipatropium  nebulizer if you first  try Plan B and it fails to help > ok to use the nebulizer up to every 4 hours but if start needing it regularly call for immediate appointment The key is to stop smoking completely before smoking completely stops you - it's the most important aspect of your care    07/15/2016  f/u ov/Tracy Gray re: GOLD III/ ecigs only/ advair 115 2bid   Chief Complaint  Patient presents with  . Follow-up    Pt states "I have been sick for a month"- increased cough with green sputum, chest tightness and hoarseness.  Her breathing is unchanged. She has been using albuterol inhaler 4-5 x per day on average.   prior to one month before visit was not using saba at all / mostly needing during the day, not noct, worse in hot humid conditions.  Not needing neb saba/sama yet though it is part of her action plan  No obvious day to day or daytime variability or assoc  cp or   overt sinus or  hb symptoms. No unusual exp hx or h/o childhood pna/ asthma or knowledge of premature birth.  Sleeping ok without nocturnal  or early am exacerbation  of respiratory  c/o's or need for noct saba. Also denies any obvious fluctuation of symptoms with weather or environmental changes or other aggravating or alleviating factors except as outlined above   Current Medications, Allergies, Complete Past Medical History, Past Surgical History, Family History, and Social History were reviewed in [employer] record.  ROS  The following are not active complaints unless bolded sore throat, dysphagia, dental problems, itching, sneezing,  nasal congestion or excess/ purulent secretions, ear ache,   fever, chills, sweats, unintended wt loss, classically pleuritic or exertional cp, hemoptysis,  orthopnea pnd or leg swelling, presyncope, palpitations, abdominal pain, anorexia, nausea, vomiting, diarrhea  or change in bowel or bladder habits, change in stools or urine, dysuria,hematuria,  rash, arthralgias, visual complaints, headache, numbness, weakness or ataxia or problems with walking or coordination,  change in mood/affect or memory.                    Objective:   Physical Exam  Moderately hoarse amb wf nad     07/15/2016       152   04/15/2016      159   03/01/16 157 lb (71.215 kg)  02/02/16 157 lb (71.215 kg)  10/09/12 142 lb (64.411 kg)    Vital signs reviewed - noted sats 98% ra on arrival   HEENT: nl  turbinates, and oropharynx. Nl external ear canals without cough reflex - upper and lower dentures    NECK :  without JVD/Nodes/TM/ nl carotid upstrokes bilaterally   LUNGS: no acc muscle use,  Nl contour chest which is clear to A and P bilaterally without cough on insp or exp maneuvers   CV:  RRR  no s3 or murmur or increase in P2, no edema   ABD:  soft and nontender with nl inspiratory excursion in the supine position. No bruits or organomegaly, bowel sounds  nl  MS:  Nl gait/ ext warm without deformities, calf tenderness, cyanosis or clubbing No obvious joint restrictions   SKIN: warm and dry without lesions    NEURO:  alert, approp, nl sensorium with  no motor deficits             Assessment & Plan:

## 2016-07-16 ENCOUNTER — Encounter: Payer: Self-pay | Admitting: Internal Medicine

## 2016-07-16 DIAGNOSIS — J441 Chronic obstructive pulmonary disease with (acute) exacerbation: Secondary | ICD-10-CM | POA: Insufficient documentation

## 2016-07-16 NOTE — Assessment & Plan Note (Signed)
03/01/2016    try dulera 100  2bid  - PFT's  04/11/2016  FEV1 1.32 (47 % ) ratio 60  p 13 % improvement from saba p advair prior to study with DLCO  42/42 % corrects to 66 % for alv volume   - 04/15/2016  After extensive coaching HFA effectiveness =    75% > try symbicort 160 2bid   Flare in pt with GOLD III criteria at baseline but this one is relatively mild so no need to change rx (add lama) at this point   rec zpak/ pred x 6 and add lama next   Also encouraged her to wean off e cigs as we don't know for sure what effect acute or chronic they may have on her airways but certainly better than cigs historically so congratulated on stopping this  I had an extended discussion with the patient reviewing all relevant studies completed to date and  lasting 15 to 20 minutes of a 25 minute visit    Each maintenance medication was reviewed in detail including most importantly the difference between maintenance and prns and under what circumstances the prns are to be triggered using an action plan format that is not reflected in the computer generated alphabetically organized AVS.    Please see instructions for details which were reviewed in writing and the patient given a copy highlighting the part that I personally wrote and discussed at today's ov.

## 2016-07-16 NOTE — Assessment & Plan Note (Addendum)
-   The proper method of use, as well as anticipated side effects, of a metered-dose inhaler are discussed and demonstrated to the patient. Improved effectiveness after extensive coaching during this visit to a level of approximately 75 % from a baseline of 50 % > continue hfa but work on [employer]

## 2016-08-19 ENCOUNTER — Ambulatory Visit (HOSPITAL_BASED_OUTPATIENT_CLINIC_OR_DEPARTMENT_OTHER): Payer: Medicare Other

## 2016-10-15 ENCOUNTER — Ambulatory Visit: Payer: Medicare Other | Admitting: Internal Medicine

## 2016-10-23 ENCOUNTER — Ambulatory Visit: Payer: Medicare Other | Admitting: Internal Medicine

## 2016-10-29 ENCOUNTER — Other Ambulatory Visit: Payer: Self-pay | Admitting: Internal Medicine

## 2016-10-31 ENCOUNTER — Ambulatory Visit (INDEPENDENT_AMBULATORY_CARE_PROVIDER_SITE_OTHER): Payer: Medicare Other | Admitting: Internal Medicine

## 2016-10-31 ENCOUNTER — Encounter: Payer: Self-pay | Admitting: Internal Medicine

## 2016-10-31 VITALS — BP 122/82 | HR 91 | Ht 64.0 in | Wt 156.4 lb

## 2016-10-31 DIAGNOSIS — J324 Chronic pansinusitis: Secondary | ICD-10-CM

## 2016-10-31 DIAGNOSIS — J441 Chronic obstructive pulmonary disease with (acute) exacerbation: Secondary | ICD-10-CM | POA: Diagnosis not present

## 2016-10-31 DIAGNOSIS — F17210 Nicotine dependence, cigarettes, uncomplicated: Secondary | ICD-10-CM | POA: Diagnosis not present

## 2016-10-31 MED ORDER — LEVOFLOXACIN 500 MG PO TABS: 500.0000 mg | ORAL_TABLET | Freq: Every day | ORAL | 0 refills | Status: DC

## 2016-10-31 MED ORDER — PREDNISONE 10 MG PO TABS: ORAL_TABLET | ORAL | 0 refills | Status: DC

## 2016-10-31 NOTE — Patient Instructions (Addendum)
levaquin 500 mg daily x 10 days  Prednisone 10 mg take  4 each am x 2 days,   2 each am x 2 days,  1 each am x 2 days and stop   For cough / congestion mucinex dm up to 1200 mg twice daily   Continue pantoprazole 40 mg Take 30-60 min before first meal of the day and zantac 300 mg at bedtime   Schedule appt with Dr Georgina Pillion for when you return from your trip   The key is to stop smoking completely before smoking completely stops you!   See Tammy NP 2 weeks with all your medications, even over the counter meds, separated in two separate bags, the ones you take no matter what vs the ones you stop once you feel better and take only as needed when you feel you need them.   Tammy  will generate for you a new user friendly medication calendar that will put [place] all on the same page re: your medication use.     Without this process, it simply isn't possible to assure that we are providing  your outpatient care  with  the attention to detail we feel you deserve.   If we cannot assure that you're getting that kind of care,  then we cannot manage your problem effectively from this clinic.  Once you have seen Tammy and we are sure that we're all on the same page with your medication use she will arrange follow up with me.

## 2016-10-31 NOTE — Progress Notes (Signed)
Subjective:   Patient ID: Tracy Gray, female    DOB: September 10, 1966     MRN: [id number]    Brief patient profile:  43 yowf active smoker "sick"  since September 2016 with ear pain, nasal congestion, cough and sob rx per Dr Janeice Robinson with multiple abx and prednisone but no better then to ER > still only  some better  referred to pulmonary clinic 02/02/2016 for copd eval by EDP    History of Present Illness  02/02/2016 1st [facility] office visit/ [name]   Chief Complaint  Patient presents with  . Pulmonary Consult    Referred by [facility] after ED visit on 12/17/16. Pt states that she was dxed with COPD "years ago"- c/o increased SOB and cough for the past few months.  Cough is prod with clear to green sputum. She is SOB with or without any exertion. She is using albuterol 4 x per day on average.   symptoms came on abruptly p "virus" in Sept 2016  and consistent of variable sob are worse after supper assoc with choking sensation / sore throat and overt HB and persistent Doe x car to house with groceries every time. Cough is worse in am's, minimally productive, only occ green now. rec   sinus ct > pos sinusitis > levaquin x 10   Pantoprazole (protonix) 40 mg   Take  30-60 min before first meal of the day and Zantac  @  bedtime until return to office -  GERD  Add: did not go to lab/ ok to complete when returns     03/01/2016  f/u ov/[name] re: acute/ chronic sinusitis s/p 10 d levaquin Chief Complaint  Patient presents with  . Follow-up    Breathing has slightly improved. She states chest tightness has resolved. She is still coughing with green sputum. Using albuterol 3 x daily on average.   waking up each am and need to use saba immediately despite singulair maint rx and green mucus despite levaquin  rec Please see patient coordinator before you leave today  to schedule  ENT eval next Plan A = Automatic =  dulera 100 Take 2 puffs first thing in am and then another 2 puffs  about 12 hours later.  Plan B = Backup Only use your albuterol as a rescue medication Plan C = Crisis - only use your albuterol nebulizer if you first try Plan B and it fails to help > ok to use the nebulizer up to every 4 hours but if start needing it regularly call for immediate appointment    04/15/2016  f/u ov/Ahley Bulls re: GOLD III/ still smoking/ changed to  advair 115 due  To insurance  Chief Complaint  Patient presents with  . Follow-up    PFT done 04/11/16. She c/o "feels like I don't have any air" x 3-4 days. She states that she also has "butterfly in my chest".  She is using albuterol inhaler 3 x daily on average and neb with atrovent  3 x per wk on average.   now on chantix / duoneb rarely and planning to regroup Dr  Wilburn Cornelia 4/25 Feels worse off symbicort and on advair  rec Plan A = Automatic = Symbicort 160 Take 2 puffs first thing in am and then another 2 puffs about 12 hours later.  Plan B = Backup Only use your albuterol as a rescue medication  Plan C = Crisis - only use your albuterol/ipatropium  nebulizer if you first try  Plan B and it fails to help > ok to use the nebulizer up to every 4 hours but if start needing it regularly call for immediate appointment The key is to stop smoking completely before smoking completely stops you - it's the most important aspect of your care    07/15/2016  f/u ov/[name] re: GOLD III/ ecigs only/ advair 115 2bid   Chief Complaint  Patient presents with  . Follow-up    Pt states "I have been sick for a month"- increased cough with green sputum, chest tightness and hoarseness.  Her breathing is unchanged. She has been using albuterol inhaler 4-5 x per day on average.   prior to one month before visit was not using saba at all / mostly needing during the day, not noct, worse in hot humid conditions.  Not needing neb saba/sama yet though it is part of her action plan rec zpak / Prednisone 10 mg take  4 each am x 2 days,   2 each am x 2 days,  1  each am x 2 days and stop  When cough > mucienex or mucinex dm up to 1200 mg every 12 hours and increase zantac to where after you take it after bfast and after supper Work on inhaler technique    10/31/2016  Extended  f/u ov/[name] re: GOLD IIIcopd/ab still smoking with refractory cough x months on ppi qam/ zantac hs Chief Complaint  Patient presents with  . Follow-up    Pt. states her breathing is doing ok, Still feeing SOB with exertion, pt. is coughing alot,using her inhalers more Pt. denies chest pain   seeing multiple providers for same problem, has not been back to [facility] re unresolved sinusitis Cough is variably productive green/ also nasal d/c green, best rx was levaquin      No obvious day to day or daytime variability or assoc  cp or   Overt  hb symptoms. No unusual exp hx or h/o childhood pna/ asthma or knowledge of premature birth.  Sleeping ok without nocturnal  or early am exacerbation  of respiratory  c/o's or need for noct saba. Also denies any obvious fluctuation of symptoms with weather or environmental changes or other aggravating or alleviating factors except as outlined above   Current Medications, Allergies, Complete Past Medical History, Past Surgical History, Family History, and Social History were reviewed in [employer] record.  ROS  The following are not active complaints unless bolded sore throat, dysphagia, dental problems, itching, sneezing,  nasal congestion or excess/ purulent secretions, ear ache,   fever, chills, sweats, unintended wt loss, classically pleuritic or exertional cp, hemoptysis,  orthopnea pnd or leg swelling, presyncope, palpitations, abdominal pain, anorexia, nausea, vomiting, diarrhea  or change in bowel or bladder habits, change in stools or urine, dysuria,hematuria,  rash, arthralgias, visual complaints, headache, numbness, weakness or ataxia or problems with walking or coordination,  change in mood/affect or memory.                     Objective:   Physical Exam  Moderately hoarse amb wf nad     07/15/2016       152   04/15/2016      159   03/01/16 157 lb (71.215 kg)  02/02/16 157 lb (71.215 kg)  10/09/12 142 lb (64.411 kg)    Vital signs reviewed - - Note on arrival 02 sats  98% on RA   HEENT: nl  turbinates, and oropharynx.  Nl external ear canals without cough reflex - upper and lower dentures  moderate bilateral non-specific turbinate edema     NECK :  without JVD/Nodes/TM/ nl carotid upstrokes bilaterally   LUNGS: no acc muscle use,  Nl contour chest with minimal insp and exp rhonchi   CV:  RRR  no s3 or murmur or increase in P2, no edema   ABD:  soft and nontender with nl inspiratory excursion in the supine position. No bruits or organomegaly, bowel sounds nl  MS:  Nl gait/ ext warm without deformities, calf tenderness, cyanosis or clubbing No obvious joint restrictions   SKIN: warm and dry without lesions    NEURO:  alert, approp, nl sensorium with  no motor deficits             Assessment & Plan:

## 2016-11-01 NOTE — Assessment & Plan Note (Addendum)
03/01/2016    try dulera 100  2bid  - PFT's  04/11/2016  FEV1 1.32 (47 % ) ratio 60  p 13 % improvement from saba p advair prior to study with DLCO  42/42 % corrects to 66 % for alv volume   - 04/15/2016  After extensive coaching HFA effectiveness =    75% > try symbicort 160 2bid  Onset late June 2017 > rx zpak and pred  - rx 10/31/2016 levaqun x 10 days/ Prednisone x 6 days  - The proper method of use, as well as anticipated side effects, of a metered-dose inhaler are discussed and demonstrated to the patient. Improved effectiveness after extensive coaching during this visit to a level of approximately 75 % from a baseline of 50% > continue hfa and back up duoneb     Symptoms are markedly disproportionate to objective findings and not clear this is a lung problem but pt does appear to have difficult airway management issues. DDX of  difficult airways management almost all start with A and  include Adherence, Ace Inhibitors, Acid Reflux, Active Sinus Disease, Alpha 1 Antitripsin deficiency, Anxiety masquerading as Airways dz,  ABPA,  Allergy(esp in young), Aspiration (esp in elderly), Adverse effects of meds,  Active smokers, A bunch of PE's (a small clot burden can't cause this syndrome unless there is already severe underlying pulm or vascular dz with poor reserve) plus two Bs  = Bronchiectasis and Beta blocker use..and one C= CHF   Adherence is always the initial "prime suspect" and is a multilayered concern that requires a "trust but verify" approach in every patient - starting with knowing how to use medications, especially inhalers, correctly, keeping up with refills and understanding the fundamental difference between maintenance and prns vs those medications only taken for a very short course and then stopped and not refilled.  - needs med rec next.  To keep things simple, I have asked the patient to first separate medicines that are perceived as maintenance, that is to be taken daily "no matter  what", from those medicines that are taken on only on an as-needed basis and I have given the patient examples of both, and then return to see our NP to generate a  detailed  medication calendar which should be followed until the next physician sees the patient and updates it.   Active smoking top of the usual list of suspects (see separate a/p)   ? Acid (or non-acid) GERD > always difficult to exclude as up to 75% of pts in some series report no assoc GI/ Heartburn symptoms> rec continue max (24h)  acid suppression and diet restrictions/ reviewed     ? Active sinus dz  > rx with levaquin x 10 days then f/u with ENT   ? Allergy > doubt significant role > Prednisone 10 mg take  4 each am x 2 days,   2 each am x 2 days,  1 each am x 2 days and stop     I had an extended discussion with the patient reviewing all relevant studies completed to date and  lasting 25 minutes of a 40  minute office visit  Addressing severe chronic/refractory symptoms from multiple potential sources   Each maintenance medication was reviewed in detail including most importantly the difference between maintenance and prns and under what circumstances the prns are to be triggered using an action plan format that is not reflected in the computer generated alphabetically organized AVS.    Please  see instructions for details which were reviewed in writing and the patient given a copy highlighting the part that I personally wrote and discussed at today's ov.

## 2016-11-01 NOTE — Assessment & Plan Note (Signed)
See CT sinus 02/06/16  > 10 d levaquin no better > referred to ENT > [facility] eval 04/16/16 resolved rec ns, flonase/ antihistamines and f/u prn   - rec levaquin x 10 days 10/31/2016 > f/u [facility] 2 weeks to make sure this is not the source of her refractory cough

## 2016-11-01 NOTE — Assessment & Plan Note (Signed)

## 2016-11-12 ENCOUNTER — Ambulatory Visit
Admission: RE | Admit: 2016-11-12 | Discharge: 2016-11-12 | Disposition: A | Discharge status: Home / Self Care | Payer: Medicare Other | Source: Ambulatory Visit | Attending: Internal Medicine | Admitting: Internal Medicine

## 2016-11-12 ENCOUNTER — Other Ambulatory Visit: Payer: Self-pay | Admitting: Internal Medicine

## 2016-11-12 DIAGNOSIS — R0781 Pleurodynia: Secondary | ICD-10-CM

## 2016-11-19 ENCOUNTER — Encounter: Payer: Medicare Other | Admitting: Adult Health

## 2016-11-26 DIAGNOSIS — J320 Chronic maxillary sinusitis: Secondary | ICD-10-CM | POA: Insufficient documentation

## 2016-12-05 ENCOUNTER — Other Ambulatory Visit: Payer: Self-pay | Admitting: Internal Medicine

## 2017-08-12 ENCOUNTER — Encounter: Payer: Self-pay | Admitting: Adult Health

## 2017-08-12 ENCOUNTER — Ambulatory Visit (INDEPENDENT_AMBULATORY_CARE_PROVIDER_SITE_OTHER)
Admission: RE | Admit: 2017-08-12 | Discharge: 2017-08-12 | Disposition: A | Discharge status: Home / Self Care | Payer: Medicare Other | Source: Ambulatory Visit | Attending: Adult Health | Admitting: Adult Health

## 2017-08-12 ENCOUNTER — Ambulatory Visit (INDEPENDENT_AMBULATORY_CARE_PROVIDER_SITE_OTHER): Payer: Medicare Other | Admitting: Adult Health

## 2017-08-12 VITALS — BP 112/70 | HR 77 | Ht 64.0 in | Wt 161.0 lb

## 2017-08-12 DIAGNOSIS — J449 Chronic obstructive pulmonary disease, unspecified: Secondary | ICD-10-CM | POA: Diagnosis not present

## 2017-08-12 NOTE — Progress Notes (Signed)
@Patient  ID: Tracy Gray, female    DOB: Feb 22, 1966, 51 y.o.   MRN: [id number]  Chief Complaint  Patient presents with  . Follow-up    COPD     Referring provider: Lorene Dy, MD  HPI: 51 yo female former smoker followed for COPD GOLD III  OSA on CPAP At bedtime    TEST  PFT's  04/11/2016  FEV1 1.32 (47 % ) ratio 60  p 13 % improvement from saba p advair prior to study with DLCO  42/42 % corrects to 66 % for alv volume     08/12/2017 Follow up: COPD /Med review  Patient returns for a follow-up for COPD. Patient says overall her breathing is doing okay. She denies any flare cough or wheezing. Changed to Advair MDI due to insurance . Uses Ventolin As needed  .  We reviewed all her medications organize them into a medication calendar with patient education She says she gets winded if she has to walk for long time or go up stairs..     Allergies  Allergen Reactions  . Bee Venom Anaphylaxis  . Penicillins Anaphylaxis    Immunization History  Administered Date(s) Administered  . Influenza Split 09/23/2015  . Pneumococcal-Unspecified 09/23/2015  . Tdap 04/25/2013    Past Medical History:  Diagnosis Date  . COPD (chronic obstructive pulmonary disease) ([place])   . Degenerative disc disease   . Multiple sclerosis ([place])   . Nerve damage   . OSA (obstructive sleep apnea)    not on CPAP     Tobacco History: History  Smoking Status  . Former Smoker  . Packs/day: 2.00  . Years: 42.00  . Types: Cigarettes  . Quit date: 02/16/2016  Smokeless Tobacco  . Never Used    Comment: uses e-cigs   Counseling given: Not Answered   Outpatient Encounter Prescriptions as of 08/12/2017  Medication Sig  . albuterol (PROVENTIL HFA;VENTOLIN HFA) 108 (90 BASE) MCG/ACT inhaler Inhale 2 puffs into the lungs 2 (two) times daily. May also take as needed for shortness of breath  . baclofen (LIORESAL) 10 MG tablet Take 10-15 mg by mouth 4 (four) times daily - after meals and at  bedtime. Take 1 tab 3 times daily and 1.5 tab at bedtime  . clonazePAM (KLONOPIN) 1 MG tablet Take 2 mg by mouth at bedtime.   . cyclobenzaprine (FLEXERIL) 10 MG tablet Take 10 mg by mouth 4 (four) times daily.  . cycloSPORINE (RESTASIS) 0.05 % ophthalmic emulsion Place 1 drop into both eyes 2 (two) times daily.  Marland Kitchen estrogens, conjugated, (PREMARIN) 0.625 MG tablet Take 0.625 mg by mouth daily.  . fluticasone (FLONASE) 50 MCG/ACT nasal spray Place 2 sprays into both nostrils daily.  . fluticasone-salmeterol (ADVAIR HFA) 115-21 MCG/ACT inhaler Inhale 2 puffs into the lungs 2 (two) times daily.  . furosemide (LASIX) 40 MG tablet Take 40 mg by mouth daily as needed.   Marland Kitchen HYDROcodone-acetaminophen (NORCO) 7.5-325 MG tablet Take 1 tablet by mouth every 6 (six) hours.  . medroxyPROGESTERone (PROVERA) 10 MG tablet Take 10 mg by mouth daily. Takes 1 tab daily for 7 days, revolving regimen every 3 months for period.  . mexiletine (MEXITIL) 150 MG capsule 1 cap daily, then increase to 2 daily after 1 week.  . montelukast (SINGULAIR) 10 MG tablet Take 10 mg by mouth at bedtime.  . Multiple Vitamin (MULTIVITAMIN WITH MINERALS) TABS Take 1 tablet by mouth daily.  Marland Kitchen nystatin (MYCOSTATIN) 100000 UNIT/ML suspension 5 ml swish  and swallow 2 x daily x 7 days  . pantoprazole (PROTONIX) 40 MG tablet TAKE 1 TABLET (40 MG TOTAL) BY MOUTH DAILY. TAKE 30-60 MIN BEFORE FIRST MEAL OF THE DAY  . simvastatin (ZOCOR) 20 MG tablet Take 20 mg by mouth daily.  . [DISCONTINUED] ibuprofen (ADVIL,MOTRIN) 800 MG tablet Take 1 tablet (800 mg total) by mouth every 8 (eight) hours as needed for mild pain or moderate pain.  . [DISCONTINUED] ipratropium (ATROVENT) 0.02 % nebulizer solution Take 0.5 mg by nebulization every 4 (four) hours as needed for wheezing or shortness of breath.  . [DISCONTINUED] levofloxacin (LEVAQUIN) 500 MG tablet Take 1 tablet (500 mg total) by mouth daily.  . [DISCONTINUED] predniSONE (DELTASONE) 10 MG tablet  Take  4 each am x 2 days,   2 each am x 2 days,  1 each am x 2 days and stop  . [DISCONTINUED] ranitidine (ZANTAC) 300 MG tablet One at bedtime (Patient not taking: Reported on 10/31/2016)  . [DISCONTINUED] vitamin C (ASCORBIC ACID) 500 MG tablet Take 500 mg by mouth 2 (two) times daily.   No facility-administered encounter medications on file as of 08/12/2017.      Review of Systems  Constitutional:   No  weight loss, night sweats,  Fevers, chills, + fatigue, or  lassitude.  HEENT:   No headaches,  Difficulty swallowing,  Tooth/dental problems, or  Sore throat,                No sneezing, itching, ear ache, nasal congestion, post nasal drip,   CV:  No chest pain,  Orthopnea, PND, swelling in lower extremities, anasarca, dizziness, palpitations, syncope.   GI  No heartburn, indigestion, abdominal pain, nausea, vomiting, diarrhea, change in bowel habits, loss of appetite, bloody stools.   Resp:    No chest wall deformity  Skin: no rash or lesions.  GU: no dysuria, change in color of urine, no urgency or frequency.  No flank pain, no hematuria   MS:  No joint pain or swelling.  No decreased range of motion.  No back pain.    Physical Exam  BP 112/70 (BP Location: Left Arm, Patient Position: Sitting, Cuff Size: Normal)   Pulse 77   Ht 5\' 4"  (1.626 m)   Wt 161 lb (73 kg)   SpO2 100%   BMI 27.64 kg/m   GEN: A/Ox3; pleasant , NAD    HEENT:  [place]/AT,  EACs-clear, TMs-wnl, NOSE-clear, THROAT-clear, no lesions, no postnasal drip or exudate noted.   NECK:  Supple w/ fair ROM; no JVD; normal carotid impulses w/o bruits; no thyromegaly or nodules palpated; no lymphadenopathy.    RESP  Clear  P & A; w/o, wheezes/ rales/ or rhonchi. no accessory muscle use, no dullness to percussion  CARD:  RRR, no m/r/g, no peripheral edema, pulses intact, no cyanosis or clubbing.  GI:   Soft & nt; nml bowel sounds; no organomegaly or masses detected.   Musco: Warm bil, no deformities or joint  swelling noted.   Neuro: alert, no focal deficits noted.    Skin: Warm, no lesions or rashes    Lab Results:  CBC   BNP No results found for: BNP  Imaging: Dg Chest 2 View  Result Date: 08/12/2017 CLINICAL DATA:  No chest complaints. EXAM: CHEST  2 VIEW COMPARISON:  11/12/2016 FINDINGS: The lungs are hyperinflated likely secondary to COPD. There is no focal parenchymal opacity. There is no pleural effusion or pneumothorax. The heart and mediastinal contours are  unremarkable. The osseous structures are unremarkable. IMPRESSION: No active cardiopulmonary disease. Electronically Signed   By: Kathreen Devoid   On: 08/12/2017 12:24     Assessment & Plan:   COPD  GOLD III with min reversiblity  Controlled on rx   Plan  Patient Instructions  Continue on current regimen  Chest xray today  Follow med calendar closely and bring to each visit.  follow up Dr. Melvyn Novas  As planned and As needed         Rexene Edison, NP 08/12/2017

## 2017-08-12 NOTE — Assessment & Plan Note (Signed)
Controlled on rx   Plan  Patient Instructions  Continue on current regimen  Chest xray today  Follow med calendar closely and bring to each visit.  follow up Dr. Melvyn Novas  As planned and As needed

## 2017-08-12 NOTE — Patient Instructions (Addendum)
Continue on current regimen  Chest xray today  Follow med calendar closely and bring to each visit.  follow up Dr. Melvyn Novas  As planned and As needed

## 2017-08-15 NOTE — Addendum Note (Signed)
Addended by: Desmond Dike C on: 08/15/2017 04:03 PM   Modules accepted: Orders

## 2017-08-22 ENCOUNTER — Ambulatory Visit: Payer: Medicare Other | Admitting: Internal Medicine

## 2017-11-04 ENCOUNTER — Ambulatory Visit (INDEPENDENT_AMBULATORY_CARE_PROVIDER_SITE_OTHER): Payer: Medicare Other | Admitting: Internal Medicine

## 2017-11-04 ENCOUNTER — Encounter: Payer: Self-pay | Admitting: Internal Medicine

## 2017-11-04 VITALS — BP 116/78 | HR 92 | Ht 64.0 in | Wt 168.0 lb

## 2017-11-04 DIAGNOSIS — F17210 Nicotine dependence, cigarettes, uncomplicated: Secondary | ICD-10-CM | POA: Diagnosis not present

## 2017-11-04 DIAGNOSIS — G4733 Obstructive sleep apnea (adult) (pediatric): Secondary | ICD-10-CM

## 2017-11-04 DIAGNOSIS — G4710 Hypersomnia, unspecified: Secondary | ICD-10-CM | POA: Diagnosis not present

## 2017-11-04 NOTE — Patient Instructions (Addendum)
Order- please schedule unattended home sleep test   Dx OSA  Please call me 2 weeks after the sleep test, to ask for results and recommendations  .If appropriate, we may be able to start treatment before I see you back.  Please try really hard to stop smoking.

## 2017-11-04 NOTE — Assessment & Plan Note (Signed)
Reports of loud snoring and daytime sleepiness are certainly consistent with OSA.  A study 10 years ago, when she says she weight 89 pounds, reportedly positive but she could not get a CPAP mask then to fit her face.  A more recent study was negative.  She has continued to gain weight, reflecting reduced activity from back pain and dyspnea on exertion. Plan-basics of sleep apnea discussion.  Schedule home sleep test if insurance will allow.

## 2017-11-04 NOTE — Progress Notes (Signed)
11/04/17-  51 yo F smoker to re-establish for sleep evaluation. Sleep Consult:Referred Dr. Lorene Dy;  Had sleep study in 2010 that showed OSA but then recent study through our clinic showed no OSA. Pt was formerly on CPAP.  Has gained weight.  After original study she had trouble getting CPAP mask to fit.  Now wakes herself snoring and daughter tells her she snores very loudly.  Gasping for breath, witnessed apneas.  Drowsy anytime she sits quietly. Taking clonazepam 1 mg at bedtime every other day, 2 cups of coffee in the morning. NPSG 2010-  NPSG 05/29/16- AHI 0/ hr, desat to 89%, body weight 158 lbs Seen in 2017 by Dr Melvyn Novas for COPD GOLD III. F/U tomorrow. [place] 15 Lives in trailer, disabled.  Being treated for degenerative disc disease/low back pain.  Has full dentures.  ENT-tonsils, adenoids, turbinates, sinus surgery.  Dr. Wilburn Cornelia now treating for sinusitis.  Has had fractured jaw.    Prior to Admission medications   Medication Sig Start Date End Date Taking? Authorizing Provider  albuterol (PROAIR HFA) 108 (90 Base) MCG/ACT inhaler Inhale 2 puffs into the lungs every 4 (four) hours as needed for wheezing or shortness of breath. PLAN B   Yes [provider]  albuterol (PROVENTIL) (2.5 MG/3ML) 0.083% nebulizer solution Take 2.5 mg by nebulization every 4 (four) hours as needed for wheezing or shortness of breath. PLAN C   Yes [provider]  atorvastatin (LIPITOR) 10 MG tablet Take 10 mg by mouth daily.   Yes [provider]  baclofen (LIORESAL) 10 MG tablet Take 10-15 mg by mouth 4 (four) times daily - after meals and at bedtime. Take 1 tab 3 times daily and 1.5 tab at bedtime   Yes [provider]  clonazePAM (KLONOPIN) 1 MG tablet Take 1 mg by mouth every 8 (eight) hours as needed for anxiety.   Yes [provider]  cyclobenzaprine (FLEXERIL) 10 MG tablet Take 10 mg by mouth every 12 (twelve) hours as needed for muscle spasms.   Yes  [provider]  cycloSPORINE (RESTASIS) 0.05 % ophthalmic emulsion Place 1 drop into both eyes 2 (two) times daily.   Yes [provider]  EPINEPHrine 0.3 mg/0.3 mL IJ SOAJ injection Inject 0.3 mg into the muscle once.   Yes [provider]  estrogens, conjugated, (PREMARIN) 0.625 MG tablet Take 0.625 mg by mouth daily. 07/13/14  Yes [provider]  fluticasone (FLONASE) 50 MCG/ACT nasal spray Place 2 sprays into both nostrils daily.   Yes [provider]  fluticasone-salmeterol (ADVAIR HFA) 115-21 MCG/ACT inhaler Inhale 2 puffs into the lungs 2 (two) times daily. 06/05/16  Yes Tanda Rockers, MD  furosemide (LASIX) 40 MG tablet Take 40 mg by mouth daily as needed.    Yes [provider]  HYDROcodone-acetaminophen (NORCO) 7.5-325 MG tablet Take 1 tablet by mouth every 6 (six) hours.   Yes [provider]  medroxyPROGESTERone (PROVERA) 10 MG tablet Take 10 mg by mouth daily. Takes 1 tab daily for 7 days, revolving regimen every 3 months for period. 11/09/15  Yes [provider]  metroNIDAZOLE (METROGEL) 0.75 % vaginal gel Place 1 Applicatorful vaginally as needed.   Yes [provider]  mexiletine (MEXITIL) 150 MG capsule 1 cap daily, then increase to 2 daily after 1 week. 04/26/16  Yes [provider]  montelukast (SINGULAIR) 10 MG tablet Take 10 mg by mouth at bedtime.   Yes [provider]  Multiple Vitamin (  MULTIVITAMIN WITH MINERALS) TABS Take 1 tablet by mouth daily.   Yes [provider]  Olopatadine HCl (PATADAY) 0.2 % SOLN Place 1 drop into both eyes daily.   Yes [provider]  ranitidine (ZANTAC) 150 MG tablet Take 150 mg by mouth at bedtime.   Yes [provider]   Past Medical History:  Diagnosis Date  . COPD (chronic obstructive pulmonary disease) ([place])   . Degenerative disc disease   . Multiple sclerosis ([place])   . Nerve damage   . OSA (obstructive sleep  apnea)    not on CPAP    Past Surgical History:  Procedure Laterality Date  . ECTOPIC PREGNANCY SURGERY    . EXPLORATORY LAPAROTOMY    . SINUS IRRIGATION    . TEMPOROMANDIBULAR JOINT SURGERY    . TUBAL LIGATION     Family History  Problem Relation Age of Onset  . Emphysema Mother        never smoked  . Emphysema Father        smoked  . Breast cancer Paternal Aunt   . Asthma Daughter   . Asthma Son   . Asthma Grandchild    ROS-see HPI   + = pos Constitutional:    weight loss, night sweats, fevers, chills, fatigue, lassitude. HEENT:    headaches, difficulty swallowing, tooth/dental problems, +sore throat,       sneezing, itching, ear ache, nasal congestion, post nasal drip, snoring CV:    chest pain, orthopnea, PND, swelling in lower extremities, anasarca,                                  dizziness, palpitations Resp:   +shortness of breath with exertion or at rest.                +productive cough,   non-productive cough, coughing up of blood.              change in color of mucus.  wheezing.   Skin:    rash or lesions. GI:  + heartburn, indigestion, +abdominal pain, nausea, vomiting, diarrhea,                 change in bowel habits, loss of appetite GU: dysuria, change in color of urine, no urgency or frequency.   flank pain. MS:   +joint pain, stiffness, decreased range of motion, back pain. Neuro-     nothing unusual Psych:  change in mood or affect.  depression or+anxiety.   memory loss.  OBJ- Physical Exam General- Alert, Oriented, Affect-appropriate, Distress- none acute, + overweight Skin- rash-none, lesions- none, excoriation- none Lymphadenopathy- none Head- atraumatic            Eyes- Gross vision intact, PERRLA, conjunctivae and secretions clear            Ears- Hearing, canals-normal            Nose- Clear, no-Septal dev, mucus, polyps, erosion, perforation             Throat- Mallampati III-IV , mucosa clear , drainage- none, tonsils- atrophic, +  dentures Neck- flexible , trachea midline, no stridor , thyroid nl, carotid no bruit Chest - symmetrical excursion , unlabored           Heart/CV- RRR , no murmur , no gallop  , no rub, nl s1 s2                           -  JVD- none , edema- none, stasis changes- none, varices- none           Lung- clear to P&A, cough-+wheezy , dullness-none, rub- none           Chest wall-  Abd-  Br/ Gen/ Rectal- Not done, not indicated Extrem- cyanosis- none, clubbing, none, atrophy- none, strength- nl Neuro- grossly intact to observation

## 2017-11-04 NOTE — Assessment & Plan Note (Signed)
She expresses desire to stop smoking and describes dyspnea on exertion, recurrent respiratory infections and sinusitis which are being addressed.  She will follow-up with Dr. Melvyn Novas for COPD.  Strongly encouraged to make a real effort now to stop.  Support available.

## 2017-11-05 ENCOUNTER — Encounter: Payer: Self-pay | Admitting: Internal Medicine

## 2017-11-05 ENCOUNTER — Ambulatory Visit (INDEPENDENT_AMBULATORY_CARE_PROVIDER_SITE_OTHER): Payer: Medicare Other | Admitting: Internal Medicine

## 2017-11-05 VITALS — BP 108/68 | HR 62 | Ht 64.0 in | Wt 167.8 lb

## 2017-11-05 DIAGNOSIS — J449 Chronic obstructive pulmonary disease, unspecified: Secondary | ICD-10-CM

## 2017-11-05 DIAGNOSIS — F17210 Nicotine dependence, cigarettes, uncomplicated: Secondary | ICD-10-CM | POA: Diagnosis not present

## 2017-11-05 DIAGNOSIS — R05 Cough: Secondary | ICD-10-CM

## 2017-11-05 DIAGNOSIS — R058 Other specified cough: Secondary | ICD-10-CM

## 2017-11-05 DIAGNOSIS — J329 Chronic sinusitis, unspecified: Secondary | ICD-10-CM | POA: Diagnosis not present

## 2017-11-05 MED ORDER — PANTOPRAZOLE SODIUM 40 MG PO TBEC: 40.0000 mg | DELAYED_RELEASE_TABLET | Freq: Every day | ORAL | 2 refills | Status: DC

## 2017-11-05 MED ORDER — PREDNISONE 10 MG PO TABS: ORAL_TABLET | ORAL | 0 refills | Status: DC

## 2017-11-05 MED ORDER — FLUTTER DEVI: 0 refills | Status: DC

## 2017-11-05 MED ORDER — BUDESONIDE-FORMOTEROL FUMARATE 160-4.5 MCG/ACT IN AERO: 2.0000 | INHALATION_SPRAY | Freq: Two times a day (BID) | RESPIRATORY_TRACT | 0 refills | Status: DC

## 2017-11-05 MED ORDER — BUDESONIDE-FORMOTEROL FUMARATE 160-4.5 MCG/ACT IN AERO: 2.0000 | INHALATION_SPRAY | Freq: Two times a day (BID) | RESPIRATORY_TRACT | 11 refills | Status: DC

## 2017-11-05 NOTE — Progress Notes (Signed)
Subjective:   Tracy Gray ID: Tracy Gray, female    DOB: [birth year]     MRN: [id number]    Brief Tracy Gray profile:  51 yowf active smoker "sick"  since September 2016 with ear pain, nasal congestion, cough and sob rx per Dr Janeice Robinson with multiple abx and prednisone but no better then to ER > still only  some better  referred to pulmonary clinic 02/02/2016 for copd eval by EDP    History of Present Illness  02/02/2016 1st [facility] office visit/ Tracy Gray   Chief Complaint  Tracy Gray presents with  . Pulmonary Consult    Referred by [facility] after ED visit on 12/17/16. Pt states that she was dxed with COPD "years ago"- c/o increased SOB and cough for the past few months.  Cough is prod with clear to green sputum. She is SOB with or without any exertion. She is using albuterol 4 x per day on average.   symptoms came on abruptly p "virus" in Sept 2016  and consistent of variable sob are worse after supper assoc with choking sensation / sore throat and overt HB and persistent Doe x car to house with groceries every time. Cough is worse in am's, minimally productive, only occ green now. rec   sinus ct > pos sinusitis > levaquin x 10   Pantoprazole (protonix) 40 mg   Take  30-60 min before first meal of the day and Zantac  @  bedtime until return to office -  GERD  Add: did not go to lab/ ok to complete when returns     03/01/2016  f/u ov/Tracy Gray re: acute/ chronic sinusitis s/p 10 d levaquin Chief Complaint  Tracy Gray presents with  . Follow-up    Breathing has slightly improved. She states chest tightness has resolved. She is still coughing with green sputum. Using albuterol 3 x daily on average.   waking up each am and need to use saba immediately despite singulair maint rx and green mucus despite levaquin  rec Please see Tracy Gray coordinator before you leave today  to schedule  ENT eval next Plan A = Automatic =  dulera 100 Take 2 puffs first thing in am and then another 2 puffs  about 12 hours later.  Plan B = Backup Only use your albuterol as a rescue medication Plan C = Crisis - only use your albuterol nebulizer if you first try Plan B and it fails to help > ok to use the nebulizer up to every 4 hours but if start needing it regularly call for immediate appointment    04/15/2016  f/u ov/Tracy Gray re: GOLD III/ still smoking/ changed to  advair 115 due  To insurance  Chief Complaint  Tracy Gray presents with  . Follow-up    PFT done 04/11/16. She c/o "feels like I don't have any air" x 3-4 days. She states that she also has "butterfly in my chest".  She is using albuterol inhaler 3 x daily on average and neb with atrovent  3 x per wk on average.   now on chantix / duoneb rarely and planning to regroup Dr  Wilburn Cornelia 4/25 Feels worse off symbicort and on advair  rec Plan A = Automatic = Symbicort 160 Take 2 puffs first thing in am and then another 2 puffs about 12 hours later.  Plan B = Backup Only use your albuterol as a rescue medication  Plan C = Crisis - only use your albuterol/ipatropium  nebulizer if you first try  Plan B and it fails to help > ok to use the nebulizer up to every 4 hours but if start needing it regularly call for immediate appointment The key is to stop smoking completely before smoking completely stops you - it's the most important aspect of your care    07/15/2016  f/u ov/Tracy Gray re: GOLD III/ ecigs only/ advair 115 2bid   Chief Complaint  Tracy Gray presents with  . Follow-up    Pt states "I have been sick for a month"- increased cough with green sputum, chest tightness and hoarseness.  Her breathing is unchanged. She has been using albuterol inhaler 4-5 x per day on average.   prior to one month before visit was not using saba at all / mostly needing during the day, not noct, worse in hot humid conditions.  Not needing neb saba/sama yet though it is part of her action plan rec zpak / Prednisone 10 mg take  4 each am x 2 days,   2 each am x 2 days,  1  each am x 2 days and stop  When cough > mucienex or mucinex dm up to 1200 mg every 12 hours and increase zantac to where after you take it after bfast and after supper Work on inhaler technique    10/31/2016  Extended  f/u ov/Tracy Gray re: GOLD IIIcopd/ab still smoking with refractory cough x months on ppi qam/ zantac hs Chief Complaint  Tracy Gray presents with  . Follow-up    Pt. states her breathing is doing ok, Still feeing SOB with exertion, pt. is coughing alot,using her inhalers more Pt. denies chest pain   seeing multiple providers for same problem, has not been back to [facility] re unresolved sinusitis Cough is variably productive green/ also nasal d/c green, best rx was levaquin rec levaquin 500 mg daily x 10 days Prednisone 10 mg take  4 each am x 2 days,   2 each am x 2 days,  1 each am x 2 days and stop  For cough / congestion mucinex dm up to 1200 mg twice daily  Continue pantoprazole 40 mg Take 30-60 min before first meal of the day and zantac 300 mg at bedtime  Schedule appt with Dr Georgina Pillion for when you return from your trip  The key is to stop smoking completely before smoking completely stops you!    08/12/17 NP ov for med reconciliation rec Continue on current regimen  Chest xray> ok   Follow med calendar closely and bring to each visit.     11/05/2017  f/u ov/Tracy Gray re:  GOLD  III copd/ still smoking/ no med calendar   Chief Complaint  Tracy Gray presents with  . Follow-up    has had sinus infection for the past few months, still very SOB, inhalers not working for symptoms   starts out sleep  on side most night wakes up gagging w/in an hour and hacks up green/ dark brown then back to sleep 3-4x noct consistently  x sev months/ end up recliner in chair position by the end of night x 2 months Under [facility] care for ongoing sinusitis  No better with advair / did bring formulary showing all the usual  inhalers are Tier 3 though no lama's listed Doe = MMRC3 = can't walk 100  yards even at a slow pace at a flat grade s stopping due to sob     No obvious patterns day to day or daytime variability or assoc  mucus plugs or hemoptysis  or cp or chest tightness, subjective wheeze or overt sinus or hb symptoms. No unusual exp hx or h/o childhood pna/ asthma or knowledge of premature birth.  Sleeping ok flat without nocturnal  or early am exacerbation  of respiratory  c/o's or need for noct saba. Also denies any obvious fluctuation of symptoms with weather or environmental changes or other aggravating or alleviating factors except as outlined above   Current Allergies, Complete Past Medical History, Past Surgical History, Family History, and Social History were reviewed in [employer] record.  ROS  The following are not active complaints unless bolded Hoarseness, sore throat, dysphagia, dental problems, itching, sneezing,  nasal congestion or discharge of excess mucus or purulent secretions, ear ache,   fever, chills, sweats, unintended wt loss or wt gain, classically pleuritic or exertional cp,  orthopnea pnd or leg swelling, presyncope, palpitations, abdominal pain, anorexia, nausea, vomiting, diarrhea  or change in bowel habits or change in bladder habits, change in stools or change in urine, dysuria, hematuria,  rash, arthralgias, visual complaints, headache, numbness, weakness or ataxia or problems with walking or coordination,  change in mood/affect or memory.        Current Meds  Medication Sig  . albuterol (PROAIR HFA) 108 (90 Base) MCG/ACT inhaler Inhale 2 puffs into the lungs every 4 (four) hours as needed for wheezing or shortness of breath. PLAN B  . albuterol (PROVENTIL) (2.5 MG/3ML) 0.083% nebulizer solution Take 2.5 mg by nebulization every 4 (four) hours as needed for wheezing or shortness of breath. PLAN C  . atorvastatin (LIPITOR) 10 MG tablet Take 10 mg by mouth daily.  . baclofen (LIORESAL) 10 MG tablet Take 10-15 mg by mouth 4 (four)  times daily - after meals and at bedtime. Take 1 tab 3 times daily and 1.5 tab at bedtime  . clonazePAM (KLONOPIN) 1 MG tablet Take 1 mg by mouth every 8 (eight) hours as needed for anxiety.  . cyclobenzaprine (FLEXERIL) 10 MG tablet Take 10 mg by mouth every 12 (twelve) hours as needed for muscle spasms.  . cycloSPORINE (RESTASIS) 0.05 % ophthalmic emulsion Place 1 drop into both eyes 2 (two) times daily.  Marland Kitchen EPINEPHrine 0.3 mg/0.3 mL IJ SOAJ injection Inject 0.3 mg into the muscle once.  . estrogens, conjugated, (PREMARIN) 0.625 MG tablet Take 0.625 mg by mouth daily.  . fluticasone (FLONASE) 50 MCG/ACT nasal spray Place 2 sprays into both nostrils daily.  . furosemide (LASIX) 40 MG tablet Take 40 mg by mouth daily as needed.   Marland Kitchen HYDROcodone-acetaminophen (NORCO) 7.5-325 MG tablet Take 1 tablet by mouth every 6 (six) hours.  . medroxyPROGESTERone (PROVERA) 10 MG tablet Take 10 mg by mouth daily. Takes 1 tab daily for 7 days, revolving regimen every 3 months for period.  . metroNIDAZOLE (METROGEL) 0.75 % vaginal gel Place 1 Applicatorful vaginally as needed.  . mexiletine (MEXITIL) 150 MG capsule 1 cap daily, then increase to 2 daily after 1 week.  . montelukast (SINGULAIR) 10 MG tablet Take 10 mg by mouth at bedtime.  . Multiple Vitamin (MULTIVITAMIN WITH MINERALS) TABS Take 1 tablet by mouth daily.  . Olopatadine HCl (PATADAY) 0.2 % SOLN Place 1 drop into both eyes daily.  . ranitidine (ZANTAC) 150 MG tablet Take 150 mg by mouth at bedtime.  . [DISCONTINUED] fluticasone-salmeterol (ADVAIR HFA) 115-21 MCG/ACT inhaler Inhale 2 puffs into the lungs 2 (two) times daily.  Objective:   Physical Exam    amb wf nad  Mildly/ moderately hoarse   11/05/2017     167  07/15/2016       152   04/15/2016      159   03/01/16 157 lb (71.215 kg)  02/02/16 157 lb (71.215 kg)  10/09/12 142 lb (64.411 kg)    Vital signs reviewed - - Note on arrival 02 sats  95% on RA     HEENT: nl   and oropharynx. Nl external ear canals without cough reflex - moderate bilateral non-specific turbinate edema  / upper and lower dentures   NECK :  without JVD/Nodes/TM/ nl carotid upstrokes bilaterally   LUNGS: no acc muscle use,  Nl contour chest which is clear to A and P bilaterally without cough on insp or exp maneuvers   CV:  RRR  no s3 or murmur or increase in P2, and no edema   ABD:  soft and nontender with nl inspiratory excursion in the supine position. No bruits or organomegaly appreciated, bowel sounds nl  MS:  Nl gait/ ext warm without deformities, calf tenderness, cyanosis or clubbing No obvious joint restrictions   SKIN: warm and dry without lesions    NEURO:  alert, approp, nl sensorium with  no motor or cerebellar deficits apparent.        I personally reviewed images and agree with radiology impression as follows:  CXR:   08/12/17 No active cardiopulmonary disease.          Assessment & Plan:

## 2017-11-05 NOTE — Patient Instructions (Signed)
Prednisone 10 mg take  4 each am x 2 days,   2 each am x 2 days,  1 each am x 2 days and stop   For cough > mucinex dm up to 1200 mg every 12 hours and use the flutter valve as much as possible  Add pantoprazole 40 mg Take 30-60 min before first meal of the day and continue ranitidine 150 mg at bedtim e  The key is to stop smoking completely before smoking completely stops you!    Plan A = Automatic = Change advair to symbicort 160 Take 2 puffs first thing in am and then another 2 puffs about 12 hours later.   Work on inhaler technique:  relax and gently blow all the way out then take a nice smooth deep breath back in, triggering the inhaler at same time you start breathing in.  Hold for up to 5 seconds if you can. Blow out thru nose. Rinse and gargle with water when done      Plan B = Backup Only use your albuterol as a rescue medication to be used if you can't catch your breath by resting or doing a relaxed purse lip breathing pattern.  - The less you use it, the better it will work when you need it. - Ok to use the inhaler up to 2 puffs  every 4 hours if you must but call for appointment if use goes up over your usual need - Don't leave home without it !!  (think of it like the spare tire for your car)   Plan C = Crisis - only use your albuterol nebulizer if you first try Plan B and it fails to help > ok to use the nebulizer up to every 4 hours but if start needing it regularly call for immediate appointment    See Tammy NP w/in4weeks with all your medications, even over the counter meds, separated in two separate bags, the ones you take no matter what vs the ones you stop once you feel better and take only as needed when you feel you need them.   Tammy  will generate for you a new user friendly medication calendar that will put [place] all on the same page re: your medication use.

## 2017-11-06 ENCOUNTER — Encounter: Payer: Self-pay | Admitting: Internal Medicine

## 2017-11-06 NOTE — Assessment & Plan Note (Signed)
Sinus CT 02/06/2016 > Maxillary sinusitis> rx levaquin 500  daily x 10 days> min improvement 03/01/2016 > referred to ENT > [facility] Allergy profile 03/01/2016 >  Eos 0.1 /  IgE  8 neg RAST  Her coughing at hs is probably multifactorial but mostly driven by sinusitis/ pnds with secondary GERD from the cough itself.    Of the three most common causes of  Sub-acute or recurrent or chronic cough, only one (GERD)  can actually contribute to/ trigger  the other two (asthma and post nasal drip syndrome)  and perpetuate the cylce of cough.  While not intuitively obvious, many patients with chronic low grade reflux do not cough until there is a primary insult that disturbs the protective epithelial barrier and exposes sensitive nerve endings.   This is typically viral but can be direct physical injury such as with an endotracheal tube or drainage from the sinuses as is likely the case here.  The point is that once this occurs, it is difficult to eliminate the cycle  using anything but a maximally effective acid suppression regimen at least in the short run, accompanied by an appropriate diet to address non acid GERD and control / eliminate the cough itself for at least 3 days.   rec use mucinex dm/ flutter to eliminate the cyclical coughing and max rx for gerd   I had an extended discussion with the patient reviewing all relevant studies completed to date and  lasting 25 minutes of a 40  minute office visit to re-establish with me     re  severe non-specific but potentially very serious refractory respiratory symptoms of uncertain and potentially multiple  etiologies.   Each maintenance medication was reviewed in detail including most importantly the difference between maintenance and prns and under what circumstances the prns are to be triggered using an action plan format that is not reflected in the computer generated alphabetically organized AVS.    Please see AVS for specific instructions unique to this  office visit that I personally wrote and verbalized to the the pt in detail and then reviewed with pt  by my nurse highlighting any changes in therapy/plan of care  recommended at today's visit.

## 2017-11-06 NOTE — Assessment & Plan Note (Signed)
03/01/2016    try dulera 100  2bid  - PFT's  04/11/2016  FEV1 1.32 (47 % ) ratio 60  p 13 % improvement from saba p advair prior to study with DLCO  42/42 % corrects to 66 % for alv volume    - 11/05/2017  After extensive coaching HFA effectiveness =    75% > try change advair to symbicort 160 2bid   Not doing well on advair > prefer symb 160 2bid and add lama next if not improved - unfortunately there were no lama's on her present formulary  Formulary restrictions will be an ongoing challenge for the forseable future and I would be happy to pick an alternative if the pt will first  provide me a list of them but pt  will need to return here for training for any new device that is required eg dpi vs hfa vs respimat.    In meantime we can always provide samples so the patient never runs out of any needed respiratory medications.

## 2017-11-06 NOTE — Assessment & Plan Note (Signed)
See CT sinus 02/06/16  > 10 d levaquin no better > referred to ENT > [facility] eval 04/16/16 resolved rec ns, flonase/ antihistamines and f/u prn  - rec levaquin x 10 days 10/31/2016 > f/u [facility] Cornelia  Plans to see Dr Dellie Burns for this chronic refractory problem w/in a week so no more abx from this office

## 2017-11-06 NOTE — Assessment & Plan Note (Signed)
>   3 m  I took an extended  opportunity with this patient to outline the consequences of continued cigarette use  in airway disorders based on all the data we have from the multiple national lung health studies (perfomed over decades at millions of dollars in cost)  indicating that smoking cessation, not choice of inhalers or physicians, is the most important aspect of care.   

## 2017-11-20 DIAGNOSIS — G4733 Obstructive sleep apnea (adult) (pediatric): Secondary | ICD-10-CM | POA: Diagnosis not present

## 2017-11-21 DIAGNOSIS — G4733 Obstructive sleep apnea (adult) (pediatric): Secondary | ICD-10-CM | POA: Diagnosis not present

## 2017-11-27 ENCOUNTER — Other Ambulatory Visit: Payer: Self-pay | Admitting: *Deleted

## 2017-11-27 DIAGNOSIS — G4733 Obstructive sleep apnea (adult) (pediatric): Secondary | ICD-10-CM

## 2017-12-05 ENCOUNTER — Encounter: Payer: Medicare Other | Admitting: Adult Health

## 2017-12-08 DIAGNOSIS — K219 Gastro-esophageal reflux disease without esophagitis: Secondary | ICD-10-CM | POA: Insufficient documentation

## 2017-12-29 ENCOUNTER — Encounter: Payer: Self-pay | Admitting: Adult Health

## 2017-12-29 ENCOUNTER — Ambulatory Visit (INDEPENDENT_AMBULATORY_CARE_PROVIDER_SITE_OTHER): Payer: Medicare HMO | Admitting: Adult Health

## 2017-12-29 ENCOUNTER — Encounter: Payer: Self-pay | Admitting: Physician Assistant

## 2017-12-29 ENCOUNTER — Ambulatory Visit (INDEPENDENT_AMBULATORY_CARE_PROVIDER_SITE_OTHER)
Admission: RE | Admit: 2017-12-29 | Discharge: 2017-12-29 | Disposition: A | Discharge status: Home / Self Care | Payer: Medicare HMO | Source: Ambulatory Visit | Attending: Adult Health | Admitting: Adult Health

## 2017-12-29 VITALS — BP 144/72 | HR 96 | Ht 64.0 in | Wt 165.2 lb

## 2017-12-29 DIAGNOSIS — R058 Other specified cough: Secondary | ICD-10-CM

## 2017-12-29 DIAGNOSIS — R05 Cough: Secondary | ICD-10-CM | POA: Diagnosis not present

## 2017-12-29 DIAGNOSIS — K219 Gastro-esophageal reflux disease without esophagitis: Secondary | ICD-10-CM | POA: Diagnosis not present

## 2017-12-29 DIAGNOSIS — J449 Chronic obstructive pulmonary disease, unspecified: Secondary | ICD-10-CM

## 2017-12-29 DIAGNOSIS — F17210 Nicotine dependence, cigarettes, uncomplicated: Secondary | ICD-10-CM | POA: Diagnosis not present

## 2017-12-29 MED ORDER — DOXYCYCLINE HYCLATE 100 MG PO TABS: 100.0000 mg | ORAL_TABLET | Freq: Two times a day (BID) | ORAL | 0 refills | Status: DC

## 2017-12-29 NOTE — Progress Notes (Signed)
Chart and office note reviewed in detail  > agree with a/p as outlined    

## 2017-12-29 NOTE — Assessment & Plan Note (Signed)
Recurrent exacerbation in pt w/ ongoing smoking  Smoking cessation discussed  Advised on trigger prevention  Check cxr   Plan  Patient Instructions  Doxycycline 100mg  Twice daily  For 1 week , take with food.  Finish prednisone as directed .  Mucinex DM Twice daily  .As needed  Cough/congestion .  Work on not smoking .  GERD diet  Refer to GI .  Follow med calendar closely and bring to each visit.  Follow up with Dr. Melvyn Novas  In 2 months and As needed   Please contact office for sooner follow up if symptoms do not improve or worsen or seek emergency care

## 2017-12-29 NOTE — Patient Instructions (Signed)
Doxycycline 100mg  Twice daily  For 1 week , take with food.  Finish prednisone as directed .  Mucinex DM Twice daily  .As needed  Cough/congestion .  Work on not smoking .  GERD diet  Refer to GI .  Follow med calendar closely and bring to each visit.  Follow up with Dr. Melvyn Novas  In 2 months and As needed   Please contact office for sooner follow up if symptoms do not improve or worsen or seek emergency care
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
## 2017-12-29 NOTE — Addendum Note (Signed)
Addended by: Parke Poisson E on: 12/29/2017 09:59 AM   Modules accepted: Orders

## 2017-12-29 NOTE — Assessment & Plan Note (Signed)
Suspected from COPD/chronic sinusitis /GERD  Cont on current regimen  Refer to GI for ongoing GERD despite PPI/H2 blocker

## 2017-12-29 NOTE — Addendum Note (Signed)
Addended by: Della Goo C on: 12/29/2017 03:50 PM   Modules accepted: Orders

## 2017-12-29 NOTE — Assessment & Plan Note (Signed)
Smoking cessation  

## 2017-12-30 ENCOUNTER — Telehealth: Payer: Self-pay | Admitting: Adult Health

## 2017-12-30 NOTE — Telephone Encounter (Signed)
Pt called and given results. Nothing further needed.

## 2017-12-31 ENCOUNTER — Other Ambulatory Visit: Payer: Self-pay | Admitting: Internal Medicine

## 2017-12-31 DIAGNOSIS — Z1231 Encounter for screening mammogram for malignant neoplasm of breast: Secondary | ICD-10-CM

## 2018-01-08 ENCOUNTER — Encounter: Payer: Self-pay | Admitting: Physician Assistant

## 2018-01-08 ENCOUNTER — Ambulatory Visit (INDEPENDENT_AMBULATORY_CARE_PROVIDER_SITE_OTHER): Payer: Medicare HMO | Admitting: Physician Assistant

## 2018-01-08 VITALS — BP 120/68 | HR 80 | Ht 64.0 in | Wt 164.2 lb

## 2018-01-08 DIAGNOSIS — R490 Dysphonia: Secondary | ICD-10-CM | POA: Diagnosis not present

## 2018-01-08 DIAGNOSIS — R1314 Dysphagia, pharyngoesophageal phase: Secondary | ICD-10-CM | POA: Diagnosis not present

## 2018-01-08 DIAGNOSIS — Z1211 Encounter for screening for malignant neoplasm of colon: Secondary | ICD-10-CM
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
## 2018-01-08 NOTE — Patient Instructions (Signed)
You have been scheduled for an endoscopy and colonoscopy. Please follow the written instructions given to you at your visit today. Please pick up your prep supplies at the pharmacy within the next 1-3 days. If you use inhalers (even only as needed), please bring them with you on the day of your procedure. Your physician has requested that you go to www.startemmi.com and enter the access code given to you at your visit today. This web site gives a general overview about your procedure. However, you should still follow specific instructions given to you by our office regarding your preparation for the procedure.  You may have a light breakfast the morning of prep day (the day before the procedure).   You may choose from one of the following items: eggs and toast OR chicken noodle soup and crackers.   You should have your breakfast completed between 8:00 and 9:00 am the day before your procedure.    After you have had your light breakfast you should start a clear liquid diet only, NO SOLIDS. No additional solid food is allowed. You may continue to have clear liquid up to 3 hours prior to your procedure.    

## 2018-01-21 ENCOUNTER — Ambulatory Visit: Payer: Medicare HMO

## 2018-02-05 ENCOUNTER — Ambulatory Visit
Admission: RE | Admit: 2018-02-05 | Discharge: 2018-02-05 | Disposition: A | Discharge status: Home / Self Care | Payer: Medicare HMO | Source: Ambulatory Visit | Attending: Internal Medicine | Admitting: Internal Medicine

## 2018-02-05 DIAGNOSIS — Z1231 Encounter for screening mammogram for malignant neoplasm of breast: Secondary | ICD-10-CM

## 2018-02-12 ENCOUNTER — Encounter: Payer: Self-pay | Admitting: Internal Medicine

## 2018-02-12 ENCOUNTER — Encounter: Payer: Medicare HMO | Admitting: Internal Medicine

## 2018-02-12 ENCOUNTER — Ambulatory Visit (INDEPENDENT_AMBULATORY_CARE_PROVIDER_SITE_OTHER): Payer: Medicare HMO | Admitting: Internal Medicine

## 2018-02-12 DIAGNOSIS — F17210 Nicotine dependence, cigarettes, uncomplicated: Secondary | ICD-10-CM

## 2018-02-12 DIAGNOSIS — G4733 Obstructive sleep apnea (adult) (pediatric): Secondary | ICD-10-CM

## 2018-02-12 DIAGNOSIS — J449 Chronic obstructive pulmonary disease, unspecified: Secondary | ICD-10-CM | POA: Diagnosis not present

## 2018-02-12 NOTE — Progress Notes (Signed)
11/04/17-  52 yo F smoker to re-establish for sleep evaluation. Sleep Consult:Referred Dr. Lorene Dy;  Had sleep study in 2010 that showed OSA but then recent study through our clinic showed no OSA. Pt was formerly on CPAP.  Has gained weight.  After original study she had trouble getting CPAP mask to fit.  Now wakes herself snoring and daughter tells her she snores very loudly.  Gasping for breath, witnessed apneas.  Drowsy anytime she sits quietly. Taking clonazepam 1 mg at bedtime every other day, 2 cups of coffee in the morning. NPSG 2010-  NPSG 05/29/16- AHI 0/ hr, desat to 89%, body weight 158 lbs Seen in 2017 by Dr Melvyn Novas for COPD GOLD III. F/U tomorrow. [place] 15 Lives in trailer, disabled.  Being treated for degenerative disc disease/low back pain.  Has full dentures.  ENT-tonsils, adenoids, turbinates, sinus surgery.  Dr. Wilburn Cornelia now treating for sinusitis.  Has had fractured jaw.    02/12/18-52 year old female current smoker followed for OSA complicated by COPD Gold III (Dr. Melvyn Novas) ----3 month for OSA. Patient states she is not using a CPAP.  HST 11/20/17-AHI 7.4/hour, desaturation to 75%, body weight 168 pounds Discussed management of very mild OSA as directed at symptoms.  She is comfortable aiming for normal weight and sleeping off a lot of back. Significant GERD and pending esophageal dilatation.  We discussed elevation of head of bed to help both reflux and OSA. Getting over a recent cold, managing with her routine lung medicines and pending follow-up here in early March for COPD.  Still smoking about 9 cigarettes/day against advice.  ROS-see HPI   + = pos Constitutional:    weight loss, night sweats, fevers, chills, fatigue, lassitude. HEENT:    headaches, difficulty swallowing, tooth/dental problems, +sore throat,       sneezing, itching, ear ache, nasal congestion, post nasal drip, snoring CV:    chest pain, orthopnea, PND, swelling in lower extremities, anasarca,                                   dizziness, palpitations Resp:   +shortness of breath with exertion or at rest.                +productive cough,   non-productive cough, coughing up of blood.              change in color of mucus.  wheezing.   Skin:    rash or lesions. GI:  + heartburn, indigestion, +abdominal pain, nausea, vomiting, diarrhea,                 change in bowel habits, loss of appetite GU: dysuria, change in color of urine, no urgency or frequency.   flank pain. MS:   +joint pain, stiffness, decreased range of motion, back pain. Neuro-     nothing unusual Psych:  change in mood or affect.  depression or+anxiety.   memory loss.  OBJ- Physical Exam General- Alert, Oriented, Affect-appropriate, Distress- none acute, + overweight Skin- rash-none, lesions- none, excoriation- none Lymphadenopathy- none Head- atraumatic            Eyes- Gross vision intact, PERRLA, conjunctivae and secretions clear            Ears- Hearing, canals-normal            Nose- Clear, no-Septal dev, mucus, polyps, erosion, perforation  Throat- Mallampati III-IV , mucosa clear , drainage- none, tonsils- atrophic, + dentures Neck- flexible , trachea midline, no stridor , thyroid nl, carotid no bruit Chest - symmetrical excursion , unlabored           Heart/CV- RRR , no murmur , no gallop  , no rub, nl s1 s2                           - JVD- none , edema- none, stasis changes- none, varices- none           Lung- clear to P&A, cough-+wheezy , dullness-none, rub- none           Chest wall-  Abd-  Br/ Gen/ Rectal- Not done, not indicated Extrem- cyanosis- none, clubbing, none, atrophy- none, strength- nl Neuro- grossly intact to observation

## 2018-02-12 NOTE — Assessment & Plan Note (Signed)
She is recovering from a recent cold without major lower respiratory tract exacerbation. Plan-keep scheduled appointment at this office in March.

## 2018-02-12 NOTE — Assessment & Plan Note (Signed)
Disturbance of CPAP would be more disruptive than sleep disturbance from her very mild OSA. Plan-conservative measures as outlined including normal weight, sleep off back, elevating head of bed.

## 2018-02-12 NOTE — Assessment & Plan Note (Signed)
Previous smoking cessation instructions were reinforced.

## 2018-02-12 NOTE — Patient Instructions (Signed)
Suggest- for the very mild sleep apnea-  Sleep off the flat of your back, keep normal weight, stop smoking  For the reflux- suggest you put a brick in a baggiie under each of the head legs of your bed. That helps gravity to prevent reflux of stomach juice up your esophagus while you are lying down at night.   Please keep your appointment with Dr Melvyn Novas March 7 at 9:45 AM

## 2018-02-17 ENCOUNTER — Telehealth: Payer: Self-pay | Admitting: Internal Medicine

## 2018-02-17 NOTE — Telephone Encounter (Signed)
Phone message noted. Patient has the flu and needs to reschedule her procedure.   Riki Sheer, LPN  (Admitting )

## 2018-02-17 NOTE — Telephone Encounter (Signed)
OK no charge ?

## 2018-02-18 ENCOUNTER — Encounter: Payer: Medicare HMO | Admitting: Internal Medicine

## 2018-02-26 ENCOUNTER — Ambulatory Visit: Payer: Medicare HMO | Admitting: Internal Medicine

## 2018-03-20 ENCOUNTER — Ambulatory Visit (INDEPENDENT_AMBULATORY_CARE_PROVIDER_SITE_OTHER): Payer: Medicare HMO | Admitting: Internal Medicine

## 2018-03-20 ENCOUNTER — Encounter: Payer: Self-pay | Admitting: Internal Medicine

## 2018-03-20 VITALS — BP 110/70 | HR 89 | Ht 64.0 in | Wt 168.0 lb

## 2018-03-20 DIAGNOSIS — J449 Chronic obstructive pulmonary disease, unspecified: Secondary | ICD-10-CM

## 2018-03-20 DIAGNOSIS — F17210 Nicotine dependence, cigarettes, uncomplicated: Secondary | ICD-10-CM | POA: Diagnosis not present

## 2018-03-20 DIAGNOSIS — R05 Cough: Secondary | ICD-10-CM

## 2018-03-20 DIAGNOSIS — R058 Other specified cough: Secondary | ICD-10-CM

## 2018-03-20 MED ORDER — BUDESONIDE-FORMOTEROL FUMARATE 80-4.5 MCG/ACT IN AERO: INHALATION_SPRAY | RESPIRATORY_TRACT | 12 refills | Status: DC

## 2018-03-20 MED ORDER — BUDESONIDE-FORMOTEROL FUMARATE 80-4.5 MCG/ACT IN AERO: 2.0000 | INHALATION_SPRAY | Freq: Two times a day (BID) | RESPIRATORY_TRACT | 0 refills | Status: DC

## 2018-03-20 NOTE — Patient Instructions (Addendum)
Try symbicort 80 Take 2 puffs first thing in am and then another 2 puffs about 12 hours later.    Work on inhaler technique:  relax and gently blow all the way out then take a nice smooth deep breath back in, triggering the inhaler at same time you start breathing in.  Hold for up to 5 seconds if you can. Blow out thru nose. Rinse and gargle with water when done   Please schedule a follow up office visit in 6 weeks, call sooner if needed with your med calendar and  all medications /inhalers/ solutions in hand so we can verify exactly what you are taking. This includes all medications from all doctors and over the counters  -  PFT's on return

## 2018-03-20 NOTE — Progress Notes (Signed)
Subjective:   Patient ID: Tracy Gray, female    DOB: 1966-12-06     MRN: [id number]    Brief patient profile:  1 yowf active smoker "sick"  since September 2016 with ear pain, nasal congestion, cough and sob rx per Dr Janeice Robinson with multiple abx and prednisone but no better then to ER > still only  some better  referred to pulmonary clinic 02/02/2016 for copd eval by EDP  Proved to have GOLD III criteria 03/2016     History of Present Illness  02/02/2016 1st [facility] office visit/ Kristi Hyer   Chief Complaint  Patient presents with  . Pulmonary Consult    Referred by [facility] after ED visit on 12/17/16. Pt states that she was dxed with COPD "years ago"- c/o increased SOB and cough for the past few months.  Cough is prod with clear to green sputum. She is SOB with or without any exertion. She is using albuterol 4 x per day on average.   symptoms came on abruptly p "virus" in Sept 2016  and consistent of variable sob are worse after supper assoc with choking sensation / sore throat and overt HB and persistent Doe x car to house with groceries every time. Cough is worse in am's, minimally productive, only occ green now. rec   sinus ct > pos sinusitis > levaquin x 10   Pantoprazole (protonix) 40 mg   Take  30-60 min before first meal of the day and Zantac  @  bedtime until return to office -  GERD  Add: did not go to lab/ ok to complete when returns       04/15/2016  f/u ov/Christoher Drudge re: GOLD III/ still smoking/ changed to  advair 115 due  To insurance  Chief Complaint  Patient presents with  . Follow-up    PFT done 04/11/16. She c/o "feels like I don't have any air" x 3-4 days. She states that she also has "butterfly in my chest".  She is using albuterol inhaler 3 x daily on average and neb with atrovent  3 x per wk on average.   now on chantix / duoneb rarely and planning to regroup Dr  Wilburn Cornelia 4/25 Feels worse off symbicort and on advair  rec Plan A = Automatic =  Symbicort 160 Take 2 puffs first thing in am and then another 2 puffs about 12 hours later.  Plan B = Backup Only use your albuterol as a rescue medication  Plan C = Crisis - only use your albuterol/ipatropium  nebulizer if you first try Plan B and it fails to help > ok to use the nebulizer up to every 4 hours but if start needing it regularly call for immediate appointment The key is to stop smoking completely before smoking completely stops you - it's the most important aspect of your care      08/12/17 NP ov for med reconciliation rec Continue on current regimen  Chest xray> ok   Follow med calendar closely and bring to each visit.     11/05/2017  f/u ov/Ryler Laskowski re:  GOLD  III copd/ still smoking/ no med calendar   Chief Complaint  Patient presents with  . Follow-up    has had sinus infection for the past few months, still very SOB, inhalers not working for symptoms   starts out sleep  on side most night wakes up gagging w/in an hour and hacks up green/ dark brown then back to sleep 3-4x  noct consistently  x sev months/ end up recliner in chair position by the end of night x 2 months Under shoemakers care for ongoing sinusitis  No better with advair / did bring formulary showing all the usual  inhalers are Tier 3 though no lama's listed Doe = MMRC3 = can't walk 100 yards even at a slow pace at a flat grade s stopping due to sob   rec Prednisone 10 mg take  4 each am x 2 days,   2 each am x 2 days,  1 each am x 2 days and stop  For cough > mucinex dm up to 1200 mg every 12 hours and use the flutter valve as much as possible Add pantoprazole 40 mg Take 30-60 min before first meal of the day and continue ranitidine 150 mg at bedtim e The key is to stop smoking completely before smoking completely stops you!  Plan A = Automatic = Change advair to symbicort 160 Take 2 puffs first thing in am and then another 2 puffs about 12 hours later.  Work on inhaler technique:    Plan B =  Backup Only use your albuterol as a rescue medication  Plan C = Crisis - only use your albuterol nebulizer    03/20/2018  f/u ov/Malyn Aytes re:  GOLD III still smoking/ still no med calendar/ no meds  Chief Complaint  Patient presents with  . Follow-up    Doing better still having a cough, Working on quiting smoking     Dyspnea:  Unloading groceries /  MMRC2 = can't walk a nl pace on a flat grade s sob but does fine slow and flat  Cough: with exercise / no am excess mucus  Sleep: wakes up choking sensation but s excess/ purulent sputum or mucus plugs   SABA use:  avg twice daily   No obvious day to day or daytime variability or assoc  hemoptysis or cp or chest tightness, subjective wheeze or overt sinus or hb symptoms. No unusual exposure hx or h/o childhood pna/ asthma or knowledge of premature birth.    Also denies any obvious fluctuation of symptoms with weather or environmental changes or other aggravating or alleviating factors except as outlined above   Current Allergies, Complete Past Medical History, Past Surgical History, Family History, and Social History were reviewed in [employer] record.  ROS  The following are not active complaints unless bolded Hoarseness, sore throat, dysphagia, dental problems, itching, sneezing,  nasal congestion or discharge of excess mucus or purulent secretions, ear ache,   fever, chills, sweats, unintended wt loss or wt gain, classically pleuritic or exertional cp,  orthopnea pnd or leg swelling, presyncope, palpitations, abdominal pain, anorexia, nausea, vomiting, diarrhea  or change in bowel habits or change in bladder habits, change in stools or change in urine, dysuria, hematuria,  rash, arthralgias, visual complaints, headache, numbness, weakness or ataxia or problems with walking or coordination,  change in mood/affect or memory.        Current Meds  Medication Sig  . albuterol (PROAIR HFA) 108 (90 Base) MCG/ACT inhaler Inhale  2 puffs into the lungs every 4 (four) hours as needed for wheezing or shortness of breath. PLAN B  . albuterol (PROVENTIL) (2.5 MG/3ML) 0.083% nebulizer solution Take 2.5 mg by nebulization every 4 (four) hours as needed for wheezing or shortness of breath. PLAN C  . baclofen (LIORESAL) 10 MG tablet Take 10-15 mg by mouth every 6 (six) hours as needed.  Take 1 tab 3 times daily and 1.5 tab at bedtime   . clonazePAM (KLONOPIN) 1 MG tablet Take 1 mg by mouth every 8 (eight) hours as needed for anxiety.  . cyclobenzaprine (FLEXERIL) 10 MG tablet Take 10 mg by mouth every 12 (twelve) hours as needed for muscle spasms.  . cycloSPORINE (RESTASIS) 0.05 % ophthalmic emulsion Place 1 drop into both eyes 2 (two) times daily.  Marland Kitchen dextromethorphan-guaiFENesin (MUCINEX DM) 30-600 MG 12hr tablet Take 1 tablet by mouth 2 (two) times daily as needed for cough.  . EPINEPHrine 0.3 mg/0.3 mL IJ SOAJ injection Inject 0.3 mg into the muscle once.  Marland Kitchen estradiol (ESTRACE) 0.5 MG tablet Take 0.5 mg by mouth daily.  . fluticasone (FLONASE) 50 MCG/ACT nasal spray Place 2 sprays into both nostrils daily.  . furosemide (LASIX) 40 MG tablet Take 40 mg by mouth daily as needed.   Marland Kitchen HYDROcodone-acetaminophen (NORCO) 7.5-325 MG tablet Take 1 tablet by mouth every 6 (six) hours as needed.   . medroxyPROGESTERone (PROVERA) 10 MG tablet Take 10 mg by mouth daily. Takes 1 tab daily for 7 days, revolving regimen every 3 months for period.  . metroNIDAZOLE (METROGEL) 0.75 % vaginal gel Place 1 Applicatorful vaginally as needed.  . mexiletine (MEXITIL) 150 MG capsule 1 cap daily, then increase to 2 daily after 1 week.  . montelukast (SINGULAIR) 10 MG tablet Take 10 mg by mouth at bedtime.  . Olopatadine HCl (PATADAY) 0.2 % SOLN Place 1 drop into both eyes daily.  . pantoprazole (PROTONIX) 40 MG tablet Take 1 tablet (40 mg total) daily before breakfast by mouth. (Patient taking differently: Take 80 mg by mouth daily before breakfast. )  .  ranitidine (ZANTAC) 150 MG tablet Take 150 mg by mouth at bedtime.  Marland Kitchen Respiratory Therapy Supplies (FLUTTER) DEVI Use as directed  .   budesonide-formoterol (SYMBICORT) 160-4.5 MCG/ACT inhaler Inhale 2 puffs 2 (two) times daily into the lungs.         Objective:   Physical Exam  amb wf pos voice fatigue   03/20/2018        168  11/05/2017     167  07/15/2016       152   04/15/2016      159   03/01/16 157 lb (71.215 kg)  02/02/16 157 lb (71.215 kg)  10/09/12 142 lb (64.411 kg)    Vital signs reviewed - - Note on arrival 02 sats  95% on RA    HEENT: nl   and oropharynx. Nl external ear canals without cough reflex - moderate bilateral non-specific turbinate edema  / upper and lower dentures  clear to A and P bilaterally    HEENT: nl   oropharynx. Nl external ear canals without cough reflex - upper and lower dentures/ moderate bilateral non-specific turbinate edema     NECK :  without JVD/Nodes/TM/ nl carotid upstrokes bilaterally   LUNGS: no acc muscle use,  Nl contour chest which is clear to A and P bilaterally without cough on insp or exp maneuvers   CV:  RRR  no s3 or murmur or increase in P2, and no edema   ABD:  soft and nontender with nl inspiratory excursion in the supine position. No bruits or organomegaly appreciated, bowel sounds nl  MS:  Nl gait/ ext warm without deformities, calf tenderness, cyanosis or clubbing No obvious joint restrictions   SKIN: warm and dry without lesions    NEURO:  alert, approp, nl sensorium with  no motor or cerebellar deficits apparent.               Assessment & Plan:

## 2018-03-21 ENCOUNTER — Encounter: Payer: Self-pay | Admitting: Internal Medicine

## 2018-03-21 NOTE — Assessment & Plan Note (Addendum)
03/01/2016    try dulera 100  2bid  - PFT's  04/11/2016  FEV1 1.32 (47 % ) ratio 60  p 13 % improvement from saba p advair prior to study with DLCO  42/42 % corrects to 66 % for alv volume   - 11/05/2017  After extensive coaching HFA effectiveness =    75% > try change advair to symbicort 160 2bid   - 03/20/2018  After extensive coaching inhaler device  effectiveness =    90% try symb 80 2bid due to prominent cough with choking (see uacs)   DDX of  difficult airways management almost all start with A and  include Adherence, Ace Inhibitors, Acid Reflux, Active Sinus Disease, Alpha 1 Antitripsin deficiency, Anxiety masquerading as Airways dz,  ABPA,  Allergy(esp in young), Aspiration (esp in elderly), Adverse effects of meds,  Active smokers, A bunch of PE's (a small clot burden can't cause this syndrome unless there is already severe underlying pulm or vascular dz with poor reserve) plus two Bs  = Bronchiectasis and Beta blocker use..and one C= CHF   Adherence is always the initial "prime suspect" and is a multilayered concern that requires a "trust but verify" approach in every patient - starting with knowing how to use medications, especially inhalers, correctly, keeping up with refills and understanding the fundamental difference between maintenance and prns vs those medications only taken for a very short course and then stopped and not refilled.  - see hfa training - return with med cal/ with all meds in hand using a trust but verify approach to confirm accurate Medication  Reconciliation The principal here is that until we are certain that the  patients are doing what we've asked, it makes no sense to ask them to do more.   Active smoking (see separate a/p)   ? Active sinus dz > f/u shoemaker planned  ? Acid (or non-acid) GERD > always difficult to exclude as up to 75% of pts in some series report no assoc GI/ Heartburn symptoms> rec continue max (24h)  acid suppression and diet restrictions/  reviewed     ? Allergy/ asthma > note lungs clear today so may actually benefit form lower strength ICS as upper airway cough with choking may be  the biggest problem here    I had an extended discussion with the patient reviewing all relevant studies completed to date and  lasting 15 to 20 minutes of a 25 minute visit    Each maintenance medication was reviewed in detail including most importantly the difference between maintenance and prns and under what circumstances the prns are to be triggered using an action plan format that is not reflected in the computer generated alphabetically organized AVS.    Please see AVS for specific instructions unique to this visit that I personally wrote and verbalized to the the pt in detail and then reviewed with pt  by my nurse highlighting any  changes in therapy recommended at today's visit to their plan of care.

## 2018-03-21 NOTE — Assessment & Plan Note (Signed)
>   3 min Discussed the risks and costs (both direct and indirect)  of smoking relative to the benefits of quitting  Although I don't endorse regular use of e cigs/ many pts find them helpful; however, I emphasized they should be considered a "one-way bridge" off all tobacco products.

## 2018-03-21 NOTE — Assessment & Plan Note (Addendum)
Sinus CT 02/06/2016 > Maxillary sinusitis> rx levaquin 500  daily x 10 days> min improvement 03/01/2016 > referred to ENT > Shoemaker Allergy profile 03/01/2016 >  Eos 0.1 /  IgE  8 neg RAST   Trying lower strength ICS based on less is more sometimes when dealing with uacs/ pseudowheeze

## 2018-03-24 ENCOUNTER — Telehealth: Payer: Self-pay | Admitting: *Deleted

## 2018-03-24 NOTE — Telephone Encounter (Signed)
Dr. Carlean Purl,  Please review patient's visit with Dr. Melvyn Novas on 3/29.  She is scheduled for an ECL here at [facility] on Thursday.  Patient seems to have some pulmonary issues, Albuterol inhaler QID, COPD, SOB and DOE.  I will also have Osvaldo Angst take a look as well.  Please advise if patient can be done here in [place].  Thank you, B.Zebediah Beezley, CMA

## 2018-03-24 NOTE — Telephone Encounter (Signed)
Looks ok to me  She is better than she was  And as long as not on O2 should be ok

## 2018-03-25 NOTE — Telephone Encounter (Signed)
Tracy Gray and Dr. Carlean Purl,  I have reviewed this pt's chart and just spoke with her on the phone.  As Dr Gustavus Bryant note of 3/29 indicates her pulmonary status is improving and stable.  She is not on home O2 and will bring her albuterol inhalor to her procedure tomorrow.  She is cleared for anesthetic care at [facility].  Thanks,  Osvaldo Angst

## 2018-03-26 ENCOUNTER — Encounter: Payer: Self-pay | Admitting: Internal Medicine

## 2018-03-26 ENCOUNTER — Ambulatory Visit (AMBULATORY_SURGERY_CENTER): Payer: Medicare HMO | Admitting: Internal Medicine

## 2018-03-26 ENCOUNTER — Other Ambulatory Visit: Payer: Self-pay

## 2018-03-26 VITALS — BP 105/69 | HR 79 | Temp 98.9°F | Resp 15 | Ht 64.0 in | Wt 164.0 lb

## 2018-03-26 DIAGNOSIS — Z1211 Encounter for screening for malignant neoplasm of colon: Secondary | ICD-10-CM

## 2018-03-26 DIAGNOSIS — K209 Esophagitis, unspecified without bleeding: Secondary | ICD-10-CM

## 2018-03-26 DIAGNOSIS — K219 Gastro-esophageal reflux disease without esophagitis: Secondary | ICD-10-CM | POA: Diagnosis not present

## 2018-03-26 DIAGNOSIS — R1310 Dysphagia, unspecified: Secondary | ICD-10-CM

## 2018-03-26 DIAGNOSIS — D122 Benign neoplasm of ascending colon: Secondary | ICD-10-CM

## 2018-03-26 DIAGNOSIS — D126 Benign neoplasm of colon, unspecified: Secondary | ICD-10-CM

## 2018-03-26 DIAGNOSIS — D124 Benign neoplasm of descending colon: Secondary | ICD-10-CM | POA: Diagnosis not present

## 2018-03-26 DIAGNOSIS — D125 Benign neoplasm of sigmoid colon: Secondary | ICD-10-CM | POA: Diagnosis not present

## 2018-03-26 HISTORY — DX: Benign neoplasm of colon, unspecified: D12.6

## 2018-03-26 MED ORDER — SODIUM CHLORIDE 0.9 % IV SOLN: 500.0000 mL | Freq: Once | INTRAVENOUS | Status: DC

## 2018-03-26 MED ORDER — PANTOPRAZOLE SODIUM 40 MG PO TBEC: 40.0000 mg | DELAYED_RELEASE_TABLET | Freq: Two times a day (BID) | ORAL | 1 refills | Status: DC

## 2018-03-26 NOTE — Op Note (Signed)
[facility] Patient Name: Tracy Gray Procedure Date: 03/26/2018 2:57 PM MRN: [id number] Endoscopist: Gatha Mayer , MD Age: 52 Referring MD:  Date of Birth: 03-Aug-1966 Gender: Female Account #: [id number] Procedure:                Colonoscopy Indications:              Screening for colorectal malignant neoplasm Medicines:                Propofol per Anesthesia, Monitored Anesthesia Care Procedure:                Pre-Anesthesia Assessment:                           - Prior to the procedure, a History and Physical                            was performed, and patient medications and                            allergies were reviewed. The patient's tolerance of                            previous anesthesia was also reviewed. The risks                            and benefits of the procedure and the sedation                            options and risks were discussed with the patient.                            All questions were answered, and informed consent                            was obtained. Prior Anticoagulants: The patient has                            taken no previous anticoagulant or antiplatelet                            agents. ASA Grade Assessment: III - A patient with                            severe systemic disease. After reviewing the risks                            and benefits, the patient was deemed in                            satisfactory condition to undergo the procedure.                           After obtaining informed consent, the colonoscope  was passed under direct vision. Throughout the                            procedure, the patient's blood pressure, pulse, and                            oxygen saturations were monitored continuously. The                            Colonoscope was introduced through the anus and                            advanced to the the cecum, identified by    appendiceal orifice and ileocecal valve. The                            colonoscopy was performed without difficulty. The                            patient tolerated the procedure well. The quality                            of the bowel preparation was good. The ileocecal                            valve, appendiceal orifice, and rectum were                            photographed. The bowel preparation used was                            MoviPrep. Scope In: 3:11:32 PM Scope Out: 3:24:31 PM Scope Withdrawal Time: 0 hours 11 minutes 26 seconds  Total Procedure Duration: 0 hours 12 minutes 59 seconds  Findings:                 The perianal and digital rectal examinations were                            normal.                           A 10 mm polyp was found in the sigmoid colon. The                            polyp was pedunculated. The polyp was removed with                            a hot snare. Resection and retrieval were complete.                            Verification of patient identification for the                            specimen was done.  Two sessile polyps were found in the descending                            colon and ascending colon. The polyps were                            diminutive in size. These polyps were removed with                            a cold snare. Resection and retrieval were                            complete. Verification of patient identification                            for the specimen was done. Estimated blood loss was                            minimal.                           The exam was otherwise without abnormality on                            direct and retroflexion views. Complications:            No immediate complications. Estimated Blood Loss:     Estimated blood loss was minimal. Impression:               - One 10 mm polyp in the sigmoid colon, removed                            with a hot snare.  Resected and retrieved.                           - Two diminutive polyps in the descending colon and                            in the ascending colon, removed with a cold snare.                            Resected and retrieved.                           - The examination was otherwise normal on direct                            and retroflexion views. Recommendation:           - Patient has a contact number available for                            emergencies. The signs and symptoms of potential  delayed complications were discussed with the                            patient. Return to normal activities tomorrow.                            Written discharge instructions were provided to the                            patient.                           - Clear liquids x 1 hour then soft foods rest of                            day. Start prior diet tomorrow. Gatha Mayer, MD 03/26/2018 3:47:32 PM This report has been signed electronically.

## 2018-03-26 NOTE — Progress Notes (Signed)
Called to room to assist during endoscopic procedure.  Patient ID and intended procedure confirmed with present staff. Received instructions for my participation in the procedure from the performing physician.  

## 2018-03-26 NOTE — Patient Instructions (Addendum)
There was slight inflammation in the distal esophagus that may be from reflux. Biopsies taken.  I dilated the esophagus to see if that helps you swallow better.  Please take pantoprazole 40 mg twice a day - new rx sent.  I removed 3 colon polyps.  I will let you know pathology results and when to have another routine colonoscopy by mail and/or My Chart.  Go ahead and call the office and get a follow-up with me for late May or June.  I appreciate the opportunity to care for you. Gatha Mayer, MD, [facility]  Dilation diet handout given to patient with verbal instruction. Esophagitis handout given to patient. Polyp handout given to patient.  YOU HAD AN ENDOSCOPIC PROCEDURE TODAY AT [facility]:   Refer to the procedure report that was given to you for any specific questions about what was found during the examination.  If the procedure report does not answer your questions, please call your gastroenterologist to clarify.  If you requested that your care partner not be given the details of your procedure findings, then the procedure report has been included in a sealed envelope for you to review at your convenience later.  YOU SHOULD EXPECT: Some feelings of bloating in the abdomen. Passage of more gas than usual.  Walking can help get rid of the air that was put into your GI tract during the procedure and reduce the bloating. If you had a lower endoscopy (such as a colonoscopy or flexible sigmoidoscopy) you may notice spotting of blood in your stool or on the toilet paper. If you underwent a bowel prep for your procedure, you may not have a normal bowel movement for a few days.  Please Note:  You might notice some irritation and congestion in your nose or some drainage.  This is from the oxygen used during your procedure.  There is no need for concern and it should clear up in a day or so.  SYMPTOMS TO REPORT IMMEDIATELY:   Following lower endoscopy (colonoscopy or  flexible sigmoidoscopy):  Excessive amounts of blood in the stool  Significant tenderness or worsening of abdominal pains  Swelling of the abdomen that is new, acute  Fever of 100F or higher   Following upper endoscopy (EGD)  Vomiting of blood or coffee ground material  New chest pain or pain under the shoulder blades  Painful or persistently difficult swallowing  New shortness of breath  Fever of 100F or higher  Black, tarry-looking stools  For urgent or emergent issues, a gastroenterologist can be reached at any hour by calling [phone].   DIET:  We do recommend a small meal at first, but then you may proceed to your regular diet.  Drink plenty of fluids but you should avoid alcoholic beverages for 24 hours.  ACTIVITY:  You should plan to take it easy for the rest of today and you should NOT DRIVE or use heavy machinery until tomorrow (because of the sedation medicines used during the test).    FOLLOW UP: Our staff will call the number listed on your records the next business day following your procedure to check on you and address any questions or concerns that you may have regarding the information given to you following your procedure. If we do not reach you, we will leave a message.  However, if you are feeling well and you are not experiencing any problems, there is no need to return our call.  We will assume  that you have returned to your regular daily activities without incident.  If any biopsies were taken you will be contacted by phone or by letter within the next 1-3 weeks.  Please call us at [phone] if you have not heard about the biopsies in 3 weeks.    SIGNATURES/CONFIDENTIALITY: You and/or your care partner have signed paperwork which will be entered into your electronic medical record.  These signatures attest to the fact that that the information above on your After Visit Summary has been reviewed and is understood.  Full responsibility of the  confidentiality of this discharge information lies with you and/or your care-partner.

## 2018-03-26 NOTE — Op Note (Addendum)
[facility] Patient Name: Tracy Gray Procedure Date: 03/26/2018 2:58 PM MRN: [id number] Endoscopist: Gatha Mayer , MD Age: 52 Referring MD:  Date of Birth: 16-Feb-1966 Gender: Female Account #: [id number] Procedure:                Upper GI endoscopy Indications:              Dysphagia, Suspected esophageal reflux Medicines:                Propofol per Anesthesia, Monitored Anesthesia Care Procedure:                Pre-Anesthesia Assessment:                           - Prior to the procedure, a History and Physical                            was performed, and patient medications and                            allergies were reviewed. The patient's tolerance of                            previous anesthesia was also reviewed. The risks                            and benefits of the procedure and the sedation                            options and risks were discussed with the patient.                            All questions were answered, and informed consent                            was obtained. Prior Anticoagulants: The patient has                            taken no previous anticoagulant or antiplatelet                            agents. ASA Grade Assessment: III - A patient with                            severe systemic disease. After reviewing the risks                            and benefits, the patient was deemed in                            satisfactory condition to undergo the procedure.                           After obtaining informed consent, the endoscope was  passed under direct vision. Throughout the                            procedure, the patient's blood pressure, pulse, and                            oxygen saturations were monitored continuously. The                            Endoscope was introduced through the mouth, and                            advanced to the second part of duodenum. The upper              GI endoscopy was accomplished without difficulty.                            The patient tolerated the procedure well. Scope In: Scope Out: Findings:                 LA Grade A (one or more mucosal breaks less than 5                            mm, not extending between tops of 2 mucosal folds)                            esophagitis with no bleeding was found in the                            distal esophagus. Biopsies were taken with a cold                            forceps for histology. Verification of patient                            identification for the specimen was done. Estimated                            blood loss was minimal.                           The exam was otherwise without abnormality.                           The cardia and gastric fundus were normal on                            retroflexion.                           The scope was withdrawn. Dilation was performed in                            the entire esophagus with a Maloney dilator with  mild resistance at 54 Fr. Complications:            No immediate complications. Estimated Blood Loss:     Estimated blood loss was minimal. Impression:               - LA Grade A reflux esophagitis. Biopsied.                           - The examination was otherwise normal.                           - Dilation performed in the entire esophagus. Recommendation:           - Patient has a contact number available for                            emergencies. The signs and symptoms of potential                            delayed complications were discussed with the                            patient. Return to normal activities tomorrow.                            Written discharge instructions were provided to the                            patient.                           - Clear liquids x 1 hour then soft foods rest of                            day. Start prior diet tomorrow.                            - Continue present medications.                           - Use Protonix (pantoprazole) 40 mg PO BID. Call to                            get an appointment for May/June Gatha Mayer, MD 03/26/2018 3:44:10 PM This report has been signed electronically.

## 2018-03-26 NOTE — Progress Notes (Signed)
To PACU, VSS. Report to RN.tb 

## 2018-03-27 ENCOUNTER — Telehealth: Payer: Self-pay

## 2018-03-27 NOTE — Telephone Encounter (Signed)
Attempted to reach pt. Following endoscopic procedure 03/26/2018.  LM on pt. Ans. Machine.  Will try to reach pt. Again later today.

## 2018-03-27 NOTE — Telephone Encounter (Signed)
  Follow up Call-  Call back number 03/26/2018  Post procedure Call Back phone  # [phone]  Permission to leave phone message Yes  Some recent data might be hidden     Patient questions:  Do you have a fever, pain , or abdominal swelling? No. Pain Score  0 *  Have you tolerated food without any problems? Yes.    Have you been able to return to your normal activities? Yes.    Do you have any questions about your discharge instructions: Diet   No. Medications  No. Follow up visit  No.  Do you have questions or concerns about your Care? No.   Pt. Reports having a sore throat following procedure.  Told pt. It should be better tomorrow, but call if she has any further questions or concerns.  Actions: * If pain score is 4 or above: No action needed, pain <4.

## 2018-04-02 ENCOUNTER — Encounter: Payer: Self-pay | Admitting: Internal Medicine

## 2018-04-02 NOTE — Progress Notes (Signed)
1) reflux changes 2) 3 adenomas max 10 mm recall 2022

## 2018-04-29 ENCOUNTER — Other Ambulatory Visit: Payer: Self-pay | Admitting: Internal Medicine

## 2018-04-29 ENCOUNTER — Ambulatory Visit
Admission: RE | Admit: 2018-04-29 | Discharge: 2018-04-29 | Disposition: A | Discharge status: Home / Self Care | Payer: Medicare HMO | Source: Ambulatory Visit | Attending: Internal Medicine | Admitting: Internal Medicine

## 2018-04-29 DIAGNOSIS — W19XXXA Unspecified fall, initial encounter: Secondary | ICD-10-CM

## 2018-05-01 ENCOUNTER — Other Ambulatory Visit: Payer: Medicare HMO

## 2018-05-01 ENCOUNTER — Ambulatory Visit (INDEPENDENT_AMBULATORY_CARE_PROVIDER_SITE_OTHER): Payer: Medicare HMO | Admitting: Internal Medicine

## 2018-05-01 ENCOUNTER — Encounter: Payer: Self-pay | Admitting: Internal Medicine

## 2018-05-01 DIAGNOSIS — J449 Chronic obstructive pulmonary disease, unspecified: Secondary | ICD-10-CM

## 2018-05-01 DIAGNOSIS — F17210 Nicotine dependence, cigarettes, uncomplicated: Secondary | ICD-10-CM

## 2018-05-01 LAB — PULMONARY FUNCTION TEST
DL/VA % pred: 64 %
DL/VA: 3.12 ml/min/mmHg/L
DLCO unc % pred: 42 %
DLCO unc: 10.23 ml/min/mmHg
FEF 25-75 Post: 0.48 L/sec
FEF 25-75 Pre: 0.46 L/sec
FEF2575-%Change-Post: 5 %
FEF2575-%Pred-Post: 18 %
FEF2575-%Pred-Pre: 17 %
FEV1-%Change-Post: 0 %
FEV1-%Pred-Post: 41 %
FEV1-%Pred-Pre: 40 %
FEV1-Post: 1.12 L
FEV1-Pre: 1.11 L
FEV1FVC-%Change-Post: 0 %
FEV1FVC-%Pred-Pre: 72 %
FEV6-%Change-Post: 1 %
FEV6-%Pred-Post: 56 %
FEV6-%Pred-Pre: 56 %
FEV6-Post: 1.91 L
FEV6-Pre: 1.89 L
FEV6FVC-%Change-Post: 1 %
FEV6FVC-%Pred-Post: 102 %
FEV6FVC-%Pred-Pre: 101 %
FVC-%Change-Post: 0 %
FVC-%Pred-Post: 55 %
FVC-%Pred-Pre: 55 %
FVC-Post: 1.92 L
FVC-Pre: 1.92 L
Post FEV1/FVC ratio: 58 %
Post FEV6/FVC ratio: 100 %
Pre FEV1/FVC ratio: 58 %
Pre FEV6/FVC Ratio: 98 %
RV % pred: 187 %
RV: 3.46 L
TLC % pred: 110 %
TLC: 5.56 L

## 2018-05-01 NOTE — Patient Instructions (Signed)
Work on inhaler technique:  relax and gently blow all the way out then take a nice smooth deep breath back in, triggering the inhaler at same time you start breathing in.  Hold for up to 5 seconds if you can. Blow out thru nose. Rinse and gargle with water when done     No change in medications   The key is to stop smoking completely before smoking completely stops you!   Please schedule a follow up visit in 3 months but call sooner if needed

## 2018-05-01 NOTE — Progress Notes (Signed)
Subjective:   Patient ID: Tracy Gray, female    DOB: 1966-02-25     MRN: [id number]    Brief patient profile:  63 yowf active smoker "sick"  since September 2016 with ear pain, nasal congestion, cough and sob rx per Dr Janeice Robinson with multiple abx and prednisone but no better then to ER > still only  some better  referred to pulmonary clinic 02/02/2016 for copd eval by EDP  Proved to have GOLD III criteria 03/2016     History of Present Illness  02/02/2016 1st [facility] office visit/ [facility]   Chief Complaint  Patient presents with  . Pulmonary Consult    Referred by [facility] after ED visit on 12/17/16. Pt states that she was dxed with COPD "years ago"- c/o increased SOB and cough for the past few months.  Cough is prod with clear to green sputum. She is SOB with or without any exertion. She is using albuterol 4 x per day on average.   symptoms came on abruptly p "virus" in Sept 2016  and consistent of variable sob are worse after supper assoc with choking sensation / sore throat and overt HB and persistent Doe x car to house with groceries every time. Cough is worse in am's, minimally productive, only occ green now. rec   sinus ct > pos sinusitis > levaquin x 10   Pantoprazole (protonix) 40 mg   Take  30-60 min before first meal of the day and Zantac  @  bedtime until return to office -  GERD  Add: did not go to lab/ ok to complete when returns       04/15/2016  f/u ov/[facility]ert re: GOLD III/ still smoking/ changed to  advair 115 due  To insurance  Chief Complaint  Patient presents with  . Follow-up    PFT done 04/11/16. She c/o "feels like I don't have any air" x 3-4 days. She states that she also has "butterfly in my chest".  She is using albuterol inhaler 3 x daily on average and neb with atrovent  3 x per wk on average.   now on chantix / duoneb rarely and planning to regroup Dr  Wilburn Cornelia 4/25 Feels worse off symbicort and on advair  rec Plan A = Automatic =  Symbicort 160 Take 2 puffs first thing in am and then another 2 puffs about 12 hours later.  Plan B = Backup Only use your albuterol as a rescue medication  Plan C = Crisis - only use your albuterol/ipatropium  nebulizer if you first try Plan B and it fails to help > ok to use the nebulizer up to every 4 hours but if start needing it regularly call for immediate appointment The key is to stop smoking completely before smoking completely stops you - it's the most important aspect of your care      08/12/17 NP ov for med reconciliation rec Continue on current regimen  Chest xray> ok   Follow med calendar closely and bring to each visit.     11/05/2017  f/u ov/[facility] re:  GOLD  III copd/ still smoking/ no med calendar   Chief Complaint  Patient presents with  . Follow-up    has had sinus infection for the past few months, still very SOB, inhalers not working for symptoms   starts out sleep  on side most night wakes up gagging w/in an hour and hacks up green/ dark brown then back to sleep 3-4x  noct consistently  x sev months/ end up recliner in chair position by the end of night x 2 months Under shoemakers care for ongoing sinusitis  No better with advair / did bring formulary showing all the usual  inhalers are Tier 3 though no lama's listed Doe = MMRC3 = can't walk 100 yards even at a slow pace at a flat grade s stopping due to sob   rec Prednisone 10 mg take  4 each am x 2 days,   2 each am x 2 days,  1 each am x 2 days and stop  For cough > mucinex dm up to 1200 mg every 12 hours and use the flutter valve as much as possible Add pantoprazole 40 mg Take 30-60 min before first meal of the day and continue ranitidine 150 mg at bedtim e The key is to stop smoking completely before smoking completely stops you!  Plan A = Automatic = Change advair to symbicort 160 Take 2 puffs first thing in am and then another 2 puffs about 12 hours later.  Work on inhaler technique:    Plan B =  Backup Only use your albuterol as a rescue medication  Plan C = Crisis - only use your albuterol nebulizer    03/20/2018  f/u ov/[facility] re:  GOLD III still smoking/ still no med calendar/ no meds  Chief Complaint  Patient presents with  . Follow-up    Doing better still having a cough, Working on quiting smoking     Dyspnea:  Unloading groceries /  MMRC2 = can't walk a nl pace on a flat grade s sob but does fine slow and flat  Cough: with exercise / no am excess mucus  Sleep: wakes up choking sensation but s excess/ purulent sputum or mucus plugs   SABA use:  avg twice daily  rec Try symbicort 80 Take 2 puffs first thing in am and then another 2 puffs about 12 hours later.  Work on inhaler technique:    05/01/2018  f/u ov/[facility]ert re:  COPD GOLD III  On symbicort 80/ spiriva  Chief Complaint  Patient presents with  . Follow-up    doing better still having SOB, Cough mucus brown in color.   Dyspnea:  No change = MMRC 2  Cough: mostly after supper Sleep: ok SABA use:  Twice daily but hfa technique poor   No obvious day to day or daytime variability or assoc excess/ purulent sputum or mucus plugs or hemoptysis or cp or chest tightness, subjective wheeze or overt sinus or hb symptoms. No unusual exposure hx or h/o childhood pna/ asthma or knowledge of premature birth.  Sleeping  Ok 1 pillow   without nocturnal  or early am exacerbation  of respiratory  c/o's or need for noct saba. Also denies any obvious fluctuation of symptoms with weather or environmental changes or other aggravating or alleviating factors except as outlined above   Current Allergies, Complete Past Medical History, Past Surgical History, Family History, and Social History were reviewed in [employer] record.  ROS  The following are not active complaints unless bolded Hoarseness, sore throat, dysphagia, dental problems, itching, sneezing,  nasal congestion or discharge of excess mucus or purulent  secretions, ear ache,   fever, chills, sweats, unintended wt loss or wt gain, classically pleuritic or exertional cp,  orthopnea pnd or arm/hand swelling  or leg swelling, presyncope, palpitations, abdominal pain, anorexia, nausea, vomiting, diarrhea  or change in bowel habits or  change in bladder habits, change in stools or change in urine, dysuria, hematuria,  rash, arthralgias, visual complaints, headache, numbness, weakness or ataxia or problems with walking or coordination,  change in mood or  memory.        Current Meds  Medication Sig  . albuterol (PROAIR HFA) 108 (90 Base) MCG/ACT inhaler Inhale 2 puffs into the lungs every 4 (four) hours as needed for wheezing or shortness of breath. PLAN B  . albuterol (PROVENTIL) (2.5 MG/3ML) 0.083% nebulizer solution Take 2.5 mg by nebulization every 4 (four) hours as needed for wheezing or shortness of breath. PLAN C  . baclofen (LIORESAL) 10 MG tablet Take 10 mg by mouth 3 (three) times daily as needed for muscle spasms.  . budesonide-formoterol (SYMBICORT) 80-4.5 MCG/ACT inhaler Take 2 puffs first thing in am and then another 2 puffs about 12 hours later.  . clonazePAM (KLONOPIN) 1 MG tablet Take 1 mg by mouth 2 (two) times daily.  . cyclobenzaprine (FLEXERIL) 10 MG tablet Take 10 mg by mouth every 12 (twelve) hours as needed for muscle spasms.  Marland Kitchen dextromethorphan-guaiFENesin (MUCINEX DM) 30-600 MG 12hr tablet Take 1 tablet by mouth 2 (two) times daily as needed for cough.  . EPINEPHrine 0.3 mg/0.3 mL IJ SOAJ injection Inject 0.3 mg into the muscle once.  . fluticasone (FLONASE) 50 MCG/ACT nasal spray Place 2 sprays into both nostrils daily.  . furosemide (LASIX) 40 MG tablet Take 40 mg by mouth daily as needed.   Marland Kitchen HYDROcodone-acetaminophen (NORCO) 7.5-325 MG tablet Take 1 tablet by mouth every 6 (six) hours as needed.   . Melatonin 5 MG CHEW   . mexiletine (MEXITIL) 150 MG capsule 1 cap daily, then increase to 2 daily after 1 week.  . montelukast  (SINGULAIR) 10 MG tablet Take 10 mg by mouth at bedtime.  . Multiple Vitamins-Minerals (MULTI-VITAMIN GUMMIES PO)   . Nasal Dilators (EQL NASAL STRIPS MEDIUM) STRP   . nicotine polacrilex (EQ NICOTINE) 4 MG lozenge nicotine lozenge?? 4mg   . pantoprazole (PROTONIX) 40 MG tablet Take 1 tablet (40 mg total) by mouth 2 (two) times daily before a meal. Before breakfast and supper  . ranitidine (ZANTAC) 150 MG tablet Take 150 mg by mouth at bedtime.  Marland Kitchen Respiratory Therapy Supplies (FLUTTER) DEVI Use as directed  . SALINE NASAL SPRAY NA   . simvastatin (ZOCOR) 20 MG tablet TK 1 T PO QD.  Marland Kitchen varenicline (CHANTIX PAK) 0.5 MG X 11 & 1 MG X 42 tablet Take one 0.5 mg tablet by mouth once daily for 3 days, then increase to one 0.5 mg tablet twice daily for 4 days, then increase to one 1 mg tablet twice daily.           Objective:   Physical Exam  amb wf pos voice fatigue   05/01/2018       167  03/20/2018        168  11/05/2017     167  07/15/2016       152   04/15/2016      159   03/01/16 157 lb (71.215 kg)  02/02/16 157 lb (71.215 kg)  10/09/12 142 lb (64.411 kg)     Vital signs reviewed - Note on arrival 02 sats  96% on RA       HEENT: nl dentition / oropharynx. Nl external ear canals without cough reflex - mild  bilateral non-specific turbinate edema     NECK :  without JVD/Nodes/TM/ nl carotid  upstrokes bilaterally   LUNGS: no acc muscle use,  Mod barrel  contour chest wall with bilateral  Distant bs s audible wheeze and  without cough on insp or exp maneuver and mod   Hyperresonant  to  percussion bilaterally     CV:  RRR  no s3 or murmur or increase in P2, and no edema   ABD:  soft and nontender with pos mid insp Hoover's  in the supine position. No bruits or organomegaly appreciated, bowel sounds nl  MS:   Nl gait/  ext warm without deformities, calf tenderness, cyanosis or clubbing No obvious joint restrictions   SKIN: warm and dry without lesions    NEURO:  alert, approp, nl  sensorium with  no motor or cerebellar deficits apparent.         Labs ordered 05/01/2018  Alpha one screen       Assessment & Plan:

## 2018-05-01 NOTE — Progress Notes (Signed)
PFT completed today.  

## 2018-05-03 ENCOUNTER — Encounter: Payer: Self-pay | Admitting: Internal Medicine

## 2018-05-03 NOTE — Assessment & Plan Note (Signed)
4-5 min discussion re active cigarette smoking in addition to office E&M  Ask about tobacco use:  Ongoing  Advise quitting   I took an extended  opportunity with this patient to outline the consequences of continued cigarette use  in airway disorders based on all the data we have from the multiple national lung health studies (perfomed over decades at millions of dollars in cost)  indicating that smoking cessation, not choice of inhalers or physicians, is the most important aspect of her care.   Assess willingness  yes Assist in quit attempt already started per PCP, reinforced timing for d/c cigs 7 days after start chantix Arrange follow up.  Follow up per Primary Care planned

## 2018-05-03 NOTE — Assessment & Plan Note (Signed)
03/01/2016    try dulera 100  2bid  - PFT's  04/11/2016  FEV1 1.32 (47 % ) ratio 60  p 13 % improvement from saba p advair prior to study with DLCO  42/42 % corrects to 66 % for alv volume   - 11/05/2017   change advair to symbicort 160 2bid  - 03/20/2018  After extensive coaching inhaler device  effectiveness =    90% try symb 80 2bid due to prominent cough with choking (see uacs)  PFT's  05/01/2018  FEV1 1.12 (41 % ) ratio 58  p 0 % improvement from saba p nothing prior to study with DLCO  42 % corrects to 64  % for alv volume   - 05/01/2018  After extensive coaching inhaler device  effectiveness =    75%  - alpha one AT screen 05/01/2018 >>>    Progressive decline in best days likely related to ongoing smoking against medical advice (see separate a/p)  But could do a lot better with hfa and benefit from lab/ics and if not consider adding lama smi next ov p check alpha one phenotype today   I had an extended discussion with the patient reviewing all relevant studies completed to date and  lasting 15 to 20 minutes of a 25 minute visit    See device teaching which extended face to face time for this visit   Each maintenance medication was reviewed in detail including most importantly the difference between maintenance and prns and under what circumstances the prns are to be triggered using an action plan format that is not reflected in the computer generated alphabetically organized AVS.    Please see AVS for specific instructions unique to this visit that I personally wrote and verbalized to the the pt in detail and then reviewed with pt  by my nurse highlighting any  changes in therapy recommended at today's visit to their plan of care.

## 2018-05-06 LAB — ALPHA-1 ANTITRYPSIN PHENOTYPE: A-1 Antitrypsin, Ser: 171 mg/dL (ref 83–199)

## 2018-05-20 ENCOUNTER — Telehealth: Payer: Self-pay | Admitting: Internal Medicine

## 2018-05-20 ENCOUNTER — Ambulatory Visit: Payer: Medicare HMO | Admitting: Internal Medicine

## 2018-05-20 NOTE — Telephone Encounter (Signed)
No charge. 

## 2018-07-27 ENCOUNTER — Encounter: Payer: Self-pay | Admitting: Internal Medicine

## 2018-07-27 ENCOUNTER — Ambulatory Visit (INDEPENDENT_AMBULATORY_CARE_PROVIDER_SITE_OTHER): Payer: Medicare HMO | Admitting: Internal Medicine

## 2018-07-27 VITALS — BP 100/78 | HR 86 | Ht 64.0 in | Wt 164.5 lb

## 2018-07-27 DIAGNOSIS — R197 Diarrhea, unspecified: Secondary | ICD-10-CM | POA: Diagnosis not present

## 2018-07-27 DIAGNOSIS — K2100 Gastro-esophageal reflux disease with esophagitis, without bleeding: Secondary | ICD-10-CM

## 2018-07-27 DIAGNOSIS — R1310 Dysphagia, unspecified: Secondary | ICD-10-CM | POA: Diagnosis not present

## 2018-07-27 DIAGNOSIS — R1319 Other dysphagia: Secondary | ICD-10-CM

## 2018-07-27 NOTE — Patient Instructions (Signed)
  Please hold your Pantoprazole per Dr Carlean Purl and contact us on Wednesday with an update on how your diarrhea is doing.    You can take Tums as needed.   You have been scheduled for a Barium Esophogram at [facility] (1st floor of the hospital) on 07/31/18 at 10:30AM. Please arrive 15 minutes prior to your appointment for registration. Make certain not to have anything to eat or drink 3 hours prior to your test. If you need to reschedule for any reason, please contact radiology at [phone] to do so. __________________________________________________________________ A barium swallow is an examination that concentrates on views of the esophagus. This tends to be a double contrast exam (barium and two liquids which, when combined, create a gas to distend the wall of the oesophagus) or single contrast (non-ionic iodine based). The study is usually tailored to your symptoms so a good history is essential. Attention is paid during the study to the form, structure and configuration of the esophagus, looking for functional disorders (such as aspiration, dysphagia, achalasia, motility and reflux) EXAMINATION You may be asked to change into a gown, depending on the type of swallow being performed. A radiologist and radiographer will perform the procedure. The radiologist will advise you of the type of contrast selected for your procedure and direct you during the exam. You will be asked to stand, sit or lie in several different positions and to hold a small amount of fluid in your mouth before being asked to swallow while the imaging is performed .In some instances you may be asked to swallow barium coated marshmallows to assess the motility of a solid food bolus. The exam can be recorded as a digital or video fluoroscopy procedure. POST PROCEDURE It will take 1-2 days for the barium to pass through your system. To facilitate this, it is important, unless otherwise directed, to increase your fluids  for the next 24-48hrs and to resume your normal diet.  This test typically takes about 30 minutes to perform. __________________________________________________________________________________   I appreciate the opportunity to care for you. Silvano Rusk, MD., Marval Regal

## 2018-07-27 NOTE — Progress Notes (Signed)
Tracy Gray 52 y.o. 1966/02/12 [id number]  Assessment & Plan:   Encounter Diagnoses  Name Primary?  . Esophageal dysphagia Yes  . GERD with esophagitis   . Diarrhea, unspecified type     Hold pantoprazole and contact me in a couple of days to see if that is the culprit regarding diarrhea.  It is possible. Tums prn in the interim.   Ba swallow w/ tablet to evaluate persistent dysphagia.  Further plans pending that.  Question if she has a component of motility disturbance.  Note that multiple sclerosis is listed in her past medical history.  She is not on treatment for that that I can tell.  Question how well-defined that diagnosis is I will speak to her and clarify after the above.  She also is on as needed narcotics which can cause dysphagia though typically with more chronic daily use.  GD:JMEQAST, Jori Moll, MD Clois Comber, MD Subjective:   Chief Complaint: Diarrhea, dysphagia  HPI The patient is here for follow-up after an EGD and colonoscopy in April of this year.  She has a chronic cough syndrome and COPD and GERD, she had mild reflux esophagitis and had an empiric 54 French Maloney dilation for dysphagia in April and said that her dysphagia improved.  She is on twice daily pantoprazole but she is having diarrhea since that but also had her Zocor increased so she is not sure if it is 1 of the other medication but she does associated with the changes above.  Loose watery postprandial diarrhea mostly.  No weight loss.  No bleeding.  Her colonoscopy was done for screening and she had 3 adenomas.  Still chokes or strangles on food and is describing solid dysphagia-like symptoms relieved by drinking water though better than it was prior to dilation and is not resolved.  It does sound like she may potentially aspirate small amounts of food at times though I am not sure from the history.  No recent antibiotics that I can tell.  The diarrhea generally does not disturb her sleep, she is a  poor sleeper overall and only sleeps about 3 or 4 hours at night. Allergies  Allergen Reactions  . Bee Venom Anaphylaxis  . Penicillins Anaphylaxis   Current Meds  Medication Sig  . albuterol (PROAIR HFA) 108 (90 Base) MCG/ACT inhaler Inhale 2 puffs into the lungs every 4 (four) hours as needed for wheezing or shortness of breath. PLAN B  . albuterol (PROVENTIL) (2.5 MG/3ML) 0.083% nebulizer solution Take 2.5 mg by nebulization every 4 (four) hours as needed for wheezing or shortness of breath. PLAN C  . baclofen (LIORESAL) 10 MG tablet Take 10 mg by mouth 3 (three) times daily as needed for muscle spasms.  . budesonide-formoterol (SYMBICORT) 80-4.5 MCG/ACT inhaler Take 2 puffs first thing in am and then another 2 puffs about 12 hours later.  . clonazePAM (KLONOPIN) 1 MG tablet Take 1 mg by mouth 2 (two) times daily.  . cyclobenzaprine (FLEXERIL) 10 MG tablet Take 10 mg by mouth every 12 (twelve) hours as needed for muscle spasms.  Marland Kitchen dextromethorphan-guaiFENesin (MUCINEX DM) 30-600 MG 12hr tablet Take 1 tablet by mouth 2 (two) times daily as needed for cough.  . EPINEPHrine 0.3 mg/0.3 mL IJ SOAJ injection Inject 0.3 mg into the muscle as needed.   . fluticasone (FLONASE) 50 MCG/ACT nasal spray Place 2 sprays into both nostrils daily.  Marland Kitchen HYDROcodone-acetaminophen (NORCO) 7.5-325 MG tablet Take 1 tablet by mouth every 6 (six) hours  as needed.   . Melatonin 5 MG CHEW   . mexiletine (MEXITIL) 150 MG capsule 1 cap daily, then increase to 2 daily after 1 week.  . montelukast (SINGULAIR) 10 MG tablet Take 10 mg by mouth at bedtime.  . Multiple Vitamins-Minerals (MULTI-VITAMIN GUMMIES PO)   . Nasal Dilators (EQL NASAL STRIPS MEDIUM) STRP   . nicotine polacrilex (EQ NICOTINE) 4 MG lozenge nicotine lozenge?? 4mg   . pantoprazole (PROTONIX) 40 MG tablet Take 1 tablet (40 mg total) by mouth 2 (two) times daily before a meal. Before breakfast and supper  . ranitidine (ZANTAC) 150 MG tablet Take 150 mg by  mouth at bedtime.  Marland Kitchen Respiratory Therapy Supplies (FLUTTER) DEVI Use as directed  . SALINE NASAL SPRAY NA   . simvastatin (ZOCOR) 20 MG tablet TK 1 T PO QD.  Marland Kitchen varenicline (CHANTIX PAK) 0.5 MG X 11 & 1 MG X 42 tablet Take one 0.5 mg tablet by mouth once daily for 3 days, then increase to one 0.5 mg tablet twice daily for 4 days, then increase to one 1 mg tablet twice daily.   Past Medical History:  Diagnosis Date  . Adenomatous colon polyp 03/26/2018   3 adenomas, max 70mm  . Allergy   . Anxiety   . Asthma   . Chronic headache   . COPD (chronic obstructive pulmonary disease) ([place])   . Degenerative disc disease   . Depression   . Fibromyalgia   . GERD (gastroesophageal reflux disease)   . Hyperlipidemia   . Multiple sclerosis ([place])   . Nerve damage   . OSA (obstructive sleep apnea)    not on CPAP   . Sleep apnea    Past Surgical History:  Procedure Laterality Date  . APPENDECTOMY    . COLONOSCOPY    . ECTOPIC PREGNANCY SURGERY    . ESOPHAGOGASTRODUODENOSCOPY    . EXPLORATORY LAPAROTOMY    . SINUS IRRIGATION    . TEMPOROMANDIBULAR JOINT SURGERY    . TUBAL LIGATION     Social History   Social History Narrative   Legally separated not working on [employer] disability   3 children some grandchildren   Smoker no alcohol   family history includes Asthma in her daughter, grandchild, and son; Breast cancer in her paternal aunt; Cirrhosis in her maternal uncle; Emphysema in her father and mother; Hiatal hernia in her mother; Irritable bowel syndrome in her mother; Other in her mother; Prostate cancer in her father.   Review of Systems As per HPI  Objective:   Physical Exam BP 100/78   Pulse 86   Ht 5\' 4"  (1.626 m)   Wt 164 lb 8 oz (74.6 kg)   BMI 28.24 kg/m  No acute distress  15 minutes time spent with patient > half in counseling coordination of care

## 2018-07-31 ENCOUNTER — Ambulatory Visit (HOSPITAL_COMMUNITY): Payer: Medicare HMO

## 2018-08-03 ENCOUNTER — Encounter: Payer: Self-pay | Admitting: Internal Medicine

## 2018-08-03 ENCOUNTER — Ambulatory Visit (INDEPENDENT_AMBULATORY_CARE_PROVIDER_SITE_OTHER): Payer: Medicare HMO | Admitting: Internal Medicine

## 2018-08-03 VITALS — BP 124/80 | HR 63 | Ht 64.0 in | Wt 163.6 lb

## 2018-08-03 DIAGNOSIS — J449 Chronic obstructive pulmonary disease, unspecified: Secondary | ICD-10-CM | POA: Diagnosis not present

## 2018-08-03 DIAGNOSIS — R05 Cough: Secondary | ICD-10-CM | POA: Diagnosis not present

## 2018-08-03 DIAGNOSIS — R058 Other specified cough: Secondary | ICD-10-CM

## 2018-08-03 NOTE — Assessment & Plan Note (Signed)
03/01/2016    try dulera 100  2bid  - PFT's  04/11/2016  FEV1 1.32 (47 % ) ratio 60  p 13 % improvement from saba p advair prior to study with DLCO  42/42 % corrects to 66 % for alv volume   - 11/05/2017   change advair to symbicort 160 2bid  - 03/20/2018  After extensive coaching inhaler device  effectiveness =    90% try symb 80 2bid due to prominent cough with choking (see uacs) PFT's  05/01/2018  FEV1 1.12 (41 % ) ratio 58  p 0 % improvement from saba p nothing prior to study with DLCO  42 % corrects to 64  % for alv volume    - alpha one AT screen 05/01/2018 ;  MM level 171   - 08/03/2018  After extensive coaching inhaler device  effectiveness =  75% (Ti too short)     Much better AB component despite suboptimal hfa now that she's not smoking > strongly reinforced    I had an extended discussion with the patient reviewing all relevant studies completed to date and  lasting 15 to 20 minutes of a 25 minute visit    See device teaching which extended face to face time for this visit.  Each maintenance medication was reviewed in detail including emphasizing most importantly the difference between maintenance and prns and under what circumstances the prns are to be triggered using an action plan format that is not reflected in the computer generated alphabetically organized AVS which I have not found useful in most complex patients, especially with respiratory illnesses  Please see AVS for specific instructions unique to this visit that I personally wrote and verbalized to the the pt in detail and then reviewed with pt  by my nurse highlighting any  changes in therapy recommended at today's visit to their plan of care.        Marland Kitchen

## 2018-08-03 NOTE — Patient Instructions (Addendum)
Work on inhaler technique:  relax and gently blow all the way out then take a nice smooth deep breath back in, triggering the inhaler at same time you start breathing in.  Hold for up to 5 seconds if you can. Blow out thru nose. Rinse and gargle with water when done     Please schedule a follow up visit in 6  months but call sooner if needed  

## 2018-08-03 NOTE — Progress Notes (Signed)
Subjective:   Patient ID: Tracy Gray, female    DOB: 1966/10/22     MRN: [id number]  Brief patient profile:  14 yowf active smoker "sick"  since September 2016 with ear pain, nasal congestion, cough and sob rx per Dr Janeice Robinson with multiple abx and prednisone but no better then to ER > still only  some better  referred to pulmonary clinic 02/02/2016 for copd eval by EDP  Proved to have GOLD III criteria 03/2016     History of Present Illness  02/02/2016 1st [facility] office visit/ Danaria Larsen   Chief Complaint  Patient presents with  . Pulmonary Consult    Referred by [facility] after ED visit on 12/17/16. Pt states that she was dxed with COPD "years ago"- c/o increased SOB and cough for the past few months.  Cough is prod with clear to green sputum. She is SOB with or without any exertion. She is using albuterol 4 x per day on average.   symptoms came on abruptly p "virus" in Sept 2016  and consistent of variable sob are worse after supper assoc with choking sensation / sore throat and overt HB and persistent Doe x car to house with groceries every time. Cough is worse in am's, minimally productive, only occ green now. rec   sinus ct > pos sinusitis > levaquin x 10   Pantoprazole (protonix) 40 mg   Take  30-60 min before first meal of the day and Zantac  @  bedtime until return to office -  GERD  Add: did not go to lab/ ok to complete when returns       04/15/2016  f/u ov/Aryanah Enslow re: GOLD III/ still smoking/ changed to  advair 115 due  To insurance  Chief Complaint  Patient presents with  . Follow-up    PFT done 04/11/16. She c/o "feels like I don't have any air" x 3-4 days. She states that she also has "butterfly in my chest".  She is using albuterol inhaler 3 x daily on average and neb with atrovent  3 x per wk on average.   now on chantix / duoneb rarely and planning to regroup Dr  Wilburn Cornelia 4/25 Feels worse off symbicort and on advair  rec Plan A = Automatic = Symbicort  160 Take 2 puffs first thing in am and then another 2 puffs about 12 hours later.  Plan B = Backup Only use your albuterol as a rescue medication  Plan C = Crisis - only use your albuterol/ipatropium  nebulizer if you first try Plan B and it fails to help > ok to use the nebulizer up to every 4 hours but if start needing it regularly call for immediate appointment The key is to stop smoking completely before smoking completely stops you - it's the most important aspect of your care      08/12/17 NP ov for med reconciliation rec Continue on current regimen  Chest xray> ok   Follow med calendar closely and bring to each visit.     11/05/2017  f/u ov/Benedetto Ryder re:  GOLD  III copd/ still smoking/ no med calendar   Chief Complaint  Patient presents with  . Follow-up    has had sinus infection for the past few months, still very SOB, inhalers not working for symptoms   starts out sleep  on side most night wakes up gagging w/in an hour and hacks up green/ dark brown then back to sleep 3-4x noct consistently  x sev months/ end up recliner in chair position by the end of night x 2 months Under shoemakers care for ongoing sinusitis  No better with advair / did bring formulary showing all the usual  inhalers are Tier 3 though no lama's listed Doe = MMRC3 = can't walk 100 yards even at a slow pace at a flat grade s stopping due to sob   rec Prednisone 10 mg take  4 each am x 2 days,   2 each am x 2 days,  1 each am x 2 days and stop  For cough > mucinex dm up to 1200 mg every 12 hours and use the flutter valve as much as possible Add pantoprazole 40 mg Take 30-60 min before first meal of the day and continue ranitidine 150 mg at bedtim e The key is to stop smoking completely before smoking completely stops you!  Plan A = Automatic = Change advair to symbicort 160 Take 2 puffs first thing in am and then another 2 puffs about 12 hours later.  Work on inhaler technique:    Plan B = Backup Only use your  albuterol as a rescue medication  Plan C = Crisis - only use your albuterol nebulizer    03/20/2018  f/u ov/Jennamarie Goings re:  GOLD III still smoking/ still no med calendar/ no meds  Chief Complaint  Patient presents with  . Follow-up    Doing better still having a cough, Working on quiting smoking     Dyspnea:  Unloading groceries /  MMRC2 = can't walk a nl pace on a flat grade s sob but does fine slow and flat  Cough: with exercise / no am excess mucus  Sleep: wakes up choking sensation but s excess/ purulent sputum or mucus plugs   SABA use:  avg twice daily  rec Try symbicort 80 Take 2 puffs first thing in am and then another 2 puffs about 12 hours later.  Work on inhaler technique:    05/01/2018  f/u ov/Denham Mose re:  COPD GOLD III  On symbicort 80/ spiriva  Chief Complaint  Patient presents with  . Follow-up    doing better still having SOB, Cough mucus brown in color.   Dyspnea:  No change = MMRC 2  Cough: mostly after supper Sleep: ok SABA use:  Twice daily but hfa technique poor  Sleeping  Ok 1 pillow       08/03/2018  f/u ov/Matthewjames Petrasek re:  GOLD III/ no smoking since 05/2018  Chief Complaint  Patient presents with  . Follow-up    Pt states she has had some episodes where she has felt like she has been choking while swallowing. Pt is going to have a barium swallow to see if anything is shown with that. Pt is two and a half months smoking free.  Dyspnea:  MMRC2 = can't walk a nl pace on a flat grade s sob but does fine slow and flat eg 30 min at mall  Cough: just assoc with eating"like choking" w/u in progress  Sleeping: flat ok but restless SABA use: when over does it  02: none    No obvious day to day or daytime variability or assoc excess/ purulent sputum or mucus plugs or hemoptysis or cp or chest tightness, subjective wheeze or overt sinus or hb symptoms.   Sleep as above without nocturnal  or early am exacerbation  of respiratory  c/o's or need for noct saba. Also denies any  obvious fluctuation of  symptoms with weather or environmental changes or other aggravating or alleviating factors except as outlined above   No unusual exposure hx or h/o childhood pna/ asthma or knowledge of premature birth.  Current Allergies, Complete Past Medical History, Past Surgical History, Family History, and Social History were reviewed in [employer] record.  ROS  The following are not active complaints unless bolded Hoarseness, sore throat, dysphagia, dental problems, itching, sneezing,  nasal congestion or discharge of excess mucus or purulent secretions, ear ache,   fever, chills, sweats, unintended wt loss or wt gain, classically pleuritic or exertional cp,  orthopnea pnd or arm/hand swelling  or leg swelling, presyncope, palpitations, abdominal pain, anorexia, nausea, vomiting, diarrhea  or change in bowel habits or change in bladder habits, change in stools or change in urine, dysuria, hematuria,  rash, arthralgias, visual complaints, headache, numbness, weakness or ataxia or problems with walking or coordination,  change in mood or  memory.        Current Meds  Medication Sig  . albuterol (PROAIR HFA) 108 (90 Base) MCG/ACT inhaler Inhale 2 puffs into the lungs every 4 (four) hours as needed for wheezing or shortness of breath. PLAN B  . albuterol (PROVENTIL) (2.5 MG/3ML) 0.083% nebulizer solution Take 2.5 mg by nebulization every 4 (four) hours as needed for wheezing or shortness of breath. PLAN C  . baclofen (LIORESAL) 10 MG tablet Take 10 mg by mouth 3 (three) times daily as needed for muscle spasms.  . budesonide-formoterol (SYMBICORT) 80-4.5 MCG/ACT inhaler Take 2 puffs first thing in am and then another 2 puffs about 12 hours later.  . clonazePAM (KLONOPIN) 1 MG tablet Take 1 mg by mouth 2 (two) times daily.  . cyclobenzaprine (FLEXERIL) 10 MG tablet Take 10 mg by mouth every 12 (twelve) hours as needed for muscle spasms.  Marland Kitchen dextromethorphan-guaiFENesin  (MUCINEX DM) 30-600 MG 12hr tablet Take 1 tablet by mouth 2 (two) times daily as needed for cough.  . EPINEPHrine 0.3 mg/0.3 mL IJ SOAJ injection Inject 0.3 mg into the muscle as needed.   . fluticasone (FLONASE) 50 MCG/ACT nasal spray Place 2 sprays into both nostrils daily.  Marland Kitchen HYDROcodone-acetaminophen (NORCO) 7.5-325 MG tablet Take 1 tablet by mouth every 6 (six) hours as needed.   . Melatonin 5 MG CHEW   . mexiletine (MEXITIL) 150 MG capsule 1 cap daily, then increase to 2 daily after 1 week.  . montelukast (SINGULAIR) 10 MG tablet Take 10 mg by mouth at bedtime.  . Multiple Vitamins-Minerals (MULTI-VITAMIN GUMMIES PO)   . Nasal Dilators (EQL NASAL STRIPS MEDIUM) STRP   . nicotine polacrilex (EQ NICOTINE) 4 MG lozenge nicotine lozenge?? 4mg   . ranitidine (ZANTAC) 150 MG tablet Take 150 mg by mouth at bedtime.  Marland Kitchen Respiratory Therapy Supplies (FLUTTER) DEVI Use as directed  . SALINE NASAL SPRAY NA   . simvastatin (ZOCOR) 20 MG tablet TK 1 T PO QD.  Marland Kitchen varenicline (CHANTIX PAK) 0.5 MG X 11 & 1 MG X 42 tablet Take one 0.5 mg tablet by mouth once daily for 3 days, then increase to one 0.5 mg tablet twice daily for 4 days, then increase to one 1 mg tablet twice daily.               Objective:   Physical Exam  amb wf pos voice fatigue    08/03/2018        163  05/01/2018       167  03/20/2018  168  11/05/2017     167  07/15/2016       152   04/15/2016      159   03/01/16 157 lb (71.215 kg)  02/02/16 157 lb (71.215 kg)  10/09/12 142 lb (64.411 kg)    Vital signs reviewed - Note on arrival 02 sats  100% on RA         HEENT: nl dentition / oropharynx. Nl external ear canals without cough reflex - mild bilateral non-specific turbinate edema     NECK :  without JVD/Nodes/TM/ nl carotid upstrokes bilaterally   LUNGS: no acc muscle use,  Mild barrel  contour chest wall with bilateral  Distant bs s audible wheeze and  without cough on insp or exp maneuver and mild    Hyperresonant  to  percussion bilaterally     CV:  RRR  no s3 or murmur or increase in P2, and no edema   ABD:  soft and nontender with pos late insp Hoover's  in the supine position. No bruits or organomegaly appreciated, bowel sounds nl  MS:   Nl gait/  ext warm without deformities, calf tenderness, cyanosis or clubbing No obvious joint restrictions   SKIN: warm and dry without lesions    NEURO:  alert, approp, nl sensorium with  no motor or cerebellar deficits apparent.                    Assessment & Plan:

## 2018-08-03 NOTE — Assessment & Plan Note (Signed)
Sinus CT 02/06/2016 > Maxillary sinusitis> rx levaquin 500  daily x 10 days> min improvement 03/01/2016 > referred to ENT > [facility] Allergy profile 03/01/2016 >  Eos 0.1 /  IgE  8 neg RAST  Continues with choking sensation ? Irritable larynx > if gi rx not effective consider refer to [place] (Dr Carol Ada) in the voice center

## 2018-08-11 ENCOUNTER — Ambulatory Visit (HOSPITAL_COMMUNITY)
Admission: RE | Admit: 2018-08-11 | Discharge: 2018-08-11 | Disposition: A | Discharge status: Home / Self Care | Payer: Medicare HMO | Source: Ambulatory Visit | Attending: Internal Medicine | Admitting: Internal Medicine

## 2018-08-11 DIAGNOSIS — R1319 Other dysphagia: Secondary | ICD-10-CM

## 2018-08-11 DIAGNOSIS — K219 Gastro-esophageal reflux disease without esophagitis: Secondary | ICD-10-CM | POA: Insufficient documentation

## 2018-08-11 DIAGNOSIS — R1310 Dysphagia, unspecified: Secondary | ICD-10-CM | POA: Diagnosis not present

## 2018-08-11 DIAGNOSIS — K449 Diaphragmatic hernia without obstruction or gangrene: Secondary | ICD-10-CM | POA: Insufficient documentation

## 2018-08-12 NOTE — Progress Notes (Signed)
Let her know This test shows some reflux but no problem with stricture/motility  1) How is dysphagia? Still present? 2) Did holding pantoprazole help diarrhea?

## 2018-08-13 NOTE — Progress Notes (Signed)
Well if her dysphagia is still bothering her a lot then she should let us know if she wants to pursue more work-up

## 2018-08-19 NOTE — Progress Notes (Signed)
Esophageal manometry to evaluate dysphagia  Please also clarify - there is a dx of multiple sclerosis in her chart - is that accurate and if so when/how dx and what problems does she have from that?

## 2018-08-21 DIAGNOSIS — G8929 Other chronic pain: Secondary | ICD-10-CM | POA: Insufficient documentation

## 2018-08-21 DIAGNOSIS — J439 Emphysema, unspecified: Secondary | ICD-10-CM | POA: Insufficient documentation

## 2018-08-21 DIAGNOSIS — J449 Chronic obstructive pulmonary disease, unspecified: Secondary | ICD-10-CM | POA: Insufficient documentation

## 2018-08-21 DIAGNOSIS — I10 Essential (primary) hypertension: Secondary | ICD-10-CM | POA: Insufficient documentation

## 2018-08-21 DIAGNOSIS — E7800 Pure hypercholesterolemia, unspecified: Secondary | ICD-10-CM | POA: Insufficient documentation

## 2018-09-07 ENCOUNTER — Ambulatory Visit (HOSPITAL_COMMUNITY)
Admission: RE | Admit: 2018-09-07 | Discharge: 2018-09-07 | Disposition: A | Discharge status: Home / Self Care | Payer: Medicare HMO | Source: Ambulatory Visit | Attending: Gastroenterology | Admitting: Gastroenterology

## 2018-09-07 ENCOUNTER — Encounter (HOSPITAL_COMMUNITY)
Admission: RE | Disposition: A | Discharge status: Home / Self Care | Payer: Self-pay | Source: Ambulatory Visit | Attending: Gastroenterology

## 2018-09-07 DIAGNOSIS — R1310 Dysphagia, unspecified: Secondary | ICD-10-CM

## 2018-09-07 HISTORY — PX: ESOPHAGEAL MANOMETRY: SHX5429

## 2018-09-07 SURGERY — MANOMETRY, ESOPHAGUS

## 2018-09-07 MED ORDER — LIDOCAINE VISCOUS HCL 2 % MT SOLN
OROMUCOSAL | Status: AC
  Filled 2018-09-07: qty 15

## 2018-09-07 SURGICAL SUPPLY — 2 items
FACESHIELD LNG OPTICON STERILE (SAFETY) IMPLANT
GLOVE BIO SURGEON STRL SZ8 (GLOVE) ×6 IMPLANT

## 2018-09-07 NOTE — Progress Notes (Signed)
Esophageal manometry performed per protocol.  Patient tolerated procedure without complications.  Dr. Nandigam to interpret results. 

## 2018-09-08 ENCOUNTER — Encounter (HOSPITAL_COMMUNITY): Payer: Self-pay | Admitting: Gastroenterology

## 2018-09-16 DIAGNOSIS — R1310 Dysphagia, unspecified: Secondary | ICD-10-CM

## 2018-09-21 ENCOUNTER — Other Ambulatory Visit: Payer: Self-pay | Admitting: Internal Medicine

## 2018-09-21 ENCOUNTER — Ambulatory Visit
Admission: RE | Admit: 2018-09-21 | Discharge: 2018-09-21 | Disposition: A | Discharge status: Home / Self Care | Payer: Medicare HMO | Source: Ambulatory Visit | Attending: Internal Medicine | Admitting: Internal Medicine

## 2018-09-21 DIAGNOSIS — R52 Pain, unspecified: Secondary | ICD-10-CM

## 2018-09-23 NOTE — Progress Notes (Signed)
Call her and explain that she has some abnormal function of the lower esophageal sphincter  Very mild  Probably explains her problems  I see that she has hydrocodone and dextromethorphan (Mucinex DM) on her med list  Narcotics have been implicated in esophageal dysfunction so if she is using these with some regularity stopping them could possibly help.  Beyond that I think modifying diet so that she can swallow better (dysphagia 3 is a star) makes sense.  If she is still coughing and strangling with liquids would do a modified barium swallow to check the oral and pharyngeal functions more than we have - if she wants.

## 2018-12-08 ENCOUNTER — Other Ambulatory Visit (HOSPITAL_COMMUNITY): Payer: Self-pay | Admitting: Internal Medicine

## 2018-12-08 ENCOUNTER — Encounter: Payer: Self-pay | Admitting: Internal Medicine

## 2018-12-08 ENCOUNTER — Ambulatory Visit (INDEPENDENT_AMBULATORY_CARE_PROVIDER_SITE_OTHER): Payer: Medicare HMO | Admitting: Internal Medicine

## 2018-12-08 VITALS — BP 142/74 | HR 85 | Wt 163.0 lb

## 2018-12-08 DIAGNOSIS — R059 Cough, unspecified: Secondary | ICD-10-CM

## 2018-12-08 DIAGNOSIS — F1190 Opioid use, unspecified, uncomplicated: Secondary | ICD-10-CM

## 2018-12-08 DIAGNOSIS — K222 Esophageal obstruction: Secondary | ICD-10-CM | POA: Insufficient documentation

## 2018-12-08 DIAGNOSIS — R05 Cough: Secondary | ICD-10-CM | POA: Diagnosis not present

## 2018-12-08 DIAGNOSIS — R1310 Dysphagia, unspecified: Secondary | ICD-10-CM | POA: Diagnosis not present

## 2018-12-08 DIAGNOSIS — K219 Gastro-esophageal reflux disease without esophagitis: Secondary | ICD-10-CM

## 2018-12-08 DIAGNOSIS — K2289 Other specified disease of esophagus: Secondary | ICD-10-CM | POA: Insufficient documentation

## 2018-12-08 MED ORDER — FAMOTIDINE 40 MG PO TABS: 40.0000 mg | ORAL_TABLET | Freq: Every day | ORAL | 3 refills | Status: DC

## 2018-12-08 MED ORDER — DEXLANSOPRAZOLE 60 MG PO CPDR: 60.0000 mg | DELAYED_RELEASE_CAPSULE | Freq: Every day | ORAL | 3 refills | Status: DC

## 2018-12-08 NOTE — Patient Instructions (Addendum)
  Congratulations on stopping smoking.    We have sent the following medications to your pharmacy for you to pick up at your convenience: Dexilant and Pepcid   You have been scheduled for a modified barium swallow on 12/29/2018 at [facility]. Please arrive 15 minutes prior to your test for registration. You will go to [facility] (1st Floor) for your appointment. Should you need to cancel or reschedule your appointment, please contact [phone] [facility]). _____________________________________________________________________ A Modified Barium Swallow Study, or MBS, is a special x-ray that is taken to check swallowing skills. It is carried out by a Stage manager and a Psychologist, clinical (SLP). During this test, yourmouth, throat, and esophagus, a muscular tube which connects your mouth to your stomach, is checked. The test will help you, your doctor, and the SLP plan what types of foods and liquids are easier for you to swallow. The SLP will also identify positions and ways to help you swallow more easily and safely. What will happen during an MBS? You will be taken to an x-ray room and seated comfortably. You will be asked to swallow small amounts of food and liquid mixed with barium. Barium is a liquid or paste that allows images of your mouth, throat and esophagus to be seen on x-ray. The x-ray captures moving images of the food you are swallowing as it travels from your mouth through your throat and into your esophagus. This test helps identify whether food or liquid is entering your lungs (aspiration). The test also shows which part of your mouth or throat lacks strength or coordination to move the food or liquid in the right direction. This test typically takes 30 minutes to 1 hour to complete. _______________________________________________________________________  I appreciate the opportunity to care for you. Silvano Rusk, MD, [facility]

## 2018-12-08 NOTE — Progress Notes (Signed)
Tracy Gray 52 y.o. November 03, 1966 [id number]  Assessment & Plan:   Encounter Diagnoses  Name Primary?  . Gastroesophageal reflux disease, esophagitis presence not specified Yes  . Dysphagia, unspecified type   . Cough   . Esophagogastric junction outflow obstruction   . Chronic narcotic use    I believe her problems are multifactorial although reflux seems to be a significant component.  Since she has come off the pantoprazole because it caused diarrhea she is inadequately treated.  We will change her reflux treatment Start Dexilant 60 mg daily Change ranitidine given the potential carcinogen issues for famotidine and take 40 mg at bedtime  Continue working on lifestyle modifications and is congratulated on her work on smoking and caffeine so far.  He has dysphagia and esophagogastric junction outflow obstruction, narcotic usage which has been chronic for 14 years may be the culprit as that is a known cause of this problem.  However it also sounds like she might be aspirating which could certainly contribute to this cough.  Modified barium swallow was ordered  Arrange follow-up after that result is in.  Anticipate giving her a good 2 months on this treatment regimen before reassessing.  I appreciate the opportunity to care for this patient. CC: Lorene Dy, MD Dr. Jerrell Belfast  Subjective:   Chief Complaint: Cough and reflux  HPI The patient is here with persistent complaints of cough and reflux difficulties.  Foamy acid coming back up into the throat and a lot of coughing with postnasal drip and sinus drainage but also reflux.  She was on pantoprazole which was helping her reflux some, but gave her diarrhea so we discontinued that.  She is remain on ranitidine 150 mg at bedtime.  She had an esophageal manometry after EGD showed reflux changes and a small hiatal hernia and a barium swallow did not show anything different, and it showed esophagogastric outflow  obstruction.  She does take hydrocodone chronically.  After the manometry she got a sinus infection and she is on her third round of antibiotics completing doxycycline.  Dr. Wilburn Cornelia saw her for this and recommended she return to me for more treatment of her GERD.  She does report that she still chokes a lot when she is drinking water but cannot really discern whether it is related to aspiration of liquids or esophageal dysphagia.  She will wake up at night with cough and reflux at times.  Head of bed is up and she is quitting smoking having not had a cigarette in 2 months and reducing and eliminating tea coffee and sodas.  She says her lungs are as good as they have been in a long time.  She does not believe her problems are from her asthma.  I have reviewed the note from Dr. Wilburn Cornelia of 11/10/2018 Allergies  Allergen Reactions  . Bee Venom Anaphylaxis  . Penicillins Anaphylaxis   Current Meds  Medication Sig  . albuterol (PROAIR HFA) 108 (90 Base) MCG/ACT inhaler Inhale 2 puffs into the lungs every 4 (four) hours as needed for wheezing or shortness of breath. PLAN B  . albuterol (PROVENTIL) (2.5 MG/3ML) 0.083% nebulizer solution Take 2.5 mg by nebulization every 4 (four) hours as needed for wheezing or shortness of breath. PLAN C  . baclofen (LIORESAL) 10 MG tablet Take 10 mg by mouth 3 (three) times daily as needed for muscle spasms.  . budesonide-formoterol (SYMBICORT) 80-4.5 MCG/ACT inhaler Take 2 puffs first thing in am and then another 2 puffs  about 12 hours later.  . clonazePAM (KLONOPIN) 1 MG tablet Take 1 mg by mouth 2 (two) times daily.  . cyclobenzaprine (FLEXERIL) 10 MG tablet Take 10 mg by mouth every 12 (twelve) hours as needed for muscle spasms.  Marland Kitchen doxycycline (ADOXA) 150 MG tablet Take 150 mg by mouth 2 (two) times daily.  Marland Kitchen EPINEPHrine 0.3 mg/0.3 mL IJ SOAJ injection Inject 0.3 mg into the muscle as needed.   . fluticasone (FLONASE) 50 MCG/ACT nasal spray Place 2 sprays into  both nostrils daily.  Marland Kitchen HYDROcodone-acetaminophen (NORCO) 7.5-325 MG tablet Take 1 tablet by mouth every 6 (six) hours as needed.   . mexiletine (MEXITIL) 150 MG capsule 1 cap daily, then increase to 2 daily after 1 week.  . montelukast (SINGULAIR) 10 MG tablet Take 10 mg by mouth at bedtime.  . Multiple Vitamins-Minerals (MULTI-VITAMIN GUMMIES PO)   . nicotine polacrilex (EQ NICOTINE) 4 MG lozenge nicotine lozenge?? 4mg   . Respiratory Therapy Supplies (FLUTTER) DEVI Use as directed  . SALINE NASAL SPRAY NA   . simvastatin (ZOCOR) 20 MG tablet TK 1 T PO QD.  . [DISCONTINUED] ranitidine (ZANTAC) 150 MG tablet Take 150 mg by mouth at bedtime.   Past Medical History:  Diagnosis Date  . Adenomatous colon polyp 03/26/2018   3 adenomas, max 21mm  . Allergy   . Anxiety   . Asthma   . Chronic headache   . COPD (chronic obstructive pulmonary disease) ([place])   . Degenerative disc disease   . Depression   . Fibromyalgia   . GERD (gastroesophageal reflux disease)   . Hyperlipidemia   . Multiple sclerosis ([place])   . Nerve damage   . OSA (obstructive sleep apnea)    not on CPAP   . Sleep apnea    Past Surgical History:  Procedure Laterality Date  . APPENDECTOMY    . COLONOSCOPY    . ECTOPIC PREGNANCY SURGERY    . ESOPHAGEAL MANOMETRY N/A 09/07/2018   Procedure: ESOPHAGEAL MANOMETRY (EM);  Surgeon: Mauri Pole, MD;  Location: [facility];  Service: Endoscopy;  Laterality: N/A;  . ESOPHAGOGASTRODUODENOSCOPY    . EXPLORATORY LAPAROTOMY    . SINUS IRRIGATION    . TEMPOROMANDIBULAR JOINT SURGERY    . TUBAL LIGATION     Social History   Social History Narrative   Legally separated not working on Medicare disability   3 children some grandchildren   Smoker no alcohol   No cigarettes x2 months as of 12/08/2018      family history includes Asthma in her daughter, grandchild, and son; Breast cancer in her paternal aunt; Cirrhosis in her maternal uncle; Emphysema in her father and  mother; Hiatal hernia in her mother; Irritable bowel syndrome in her mother; Other in her mother; Prostate cancer in her father.   Review of Systems As above  Objective:   Physical Exam BP (!) 142/74   Pulse 85   Wt 163 lb (73.9 kg)   BMI 27.98 kg/m  Well-developed well-nourished middle-aged white woman in no acute distress she is coughing intermittently Eyes are anicteric Neck is supple The lungs show a rare wheeze at end expiration but otherwise clear and with good air movement The heart sounds are normal

## 2018-12-21 ENCOUNTER — Other Ambulatory Visit: Payer: Self-pay | Admitting: Internal Medicine

## 2018-12-29 ENCOUNTER — Encounter (HOSPITAL_COMMUNITY): Payer: Medicare HMO

## 2018-12-29 ENCOUNTER — Ambulatory Visit (HOSPITAL_COMMUNITY): Payer: Medicare HMO

## 2019-01-11 ENCOUNTER — Ambulatory Visit (HOSPITAL_COMMUNITY)
Admission: RE | Admit: 2019-01-11 | Discharge: 2019-01-11 | Disposition: A | Discharge status: Home / Self Care | Payer: Medicare HMO | Source: Ambulatory Visit | Attending: Internal Medicine | Admitting: Internal Medicine

## 2019-01-11 ENCOUNTER — Ambulatory Visit (HOSPITAL_COMMUNITY): Admission: RE | Admit: 2019-01-11 | Payer: Medicare HMO | Source: Ambulatory Visit

## 2019-01-11 DIAGNOSIS — R1310 Dysphagia, unspecified: Secondary | ICD-10-CM

## 2019-01-28 ENCOUNTER — Ambulatory Visit (HOSPITAL_COMMUNITY): Payer: Medicare HMO

## 2019-01-28 ENCOUNTER — Other Ambulatory Visit: Payer: Self-pay

## 2019-01-28 ENCOUNTER — Inpatient Hospital Stay (HOSPITAL_COMMUNITY): Admission: RE | Admit: 2019-01-28 | Payer: Medicare HMO | Source: Ambulatory Visit

## 2019-01-28 DIAGNOSIS — R1310 Dysphagia, unspecified: Secondary | ICD-10-CM

## 2019-02-03 ENCOUNTER — Encounter: Payer: Self-pay | Admitting: Internal Medicine

## 2019-02-03 ENCOUNTER — Ambulatory Visit (INDEPENDENT_AMBULATORY_CARE_PROVIDER_SITE_OTHER)
Admission: RE | Admit: 2019-02-03 | Discharge: 2019-02-03 | Disposition: A | Discharge status: Home / Self Care | Payer: Medicare HMO | Source: Ambulatory Visit | Attending: Internal Medicine | Admitting: Internal Medicine

## 2019-02-03 ENCOUNTER — Ambulatory Visit (INDEPENDENT_AMBULATORY_CARE_PROVIDER_SITE_OTHER): Payer: Medicare HMO | Admitting: Internal Medicine

## 2019-02-03 VITALS — BP 130/70 | HR 84 | Ht 64.0 in | Wt 163.0 lb

## 2019-02-03 DIAGNOSIS — J449 Chronic obstructive pulmonary disease, unspecified: Secondary | ICD-10-CM

## 2019-02-03 MED ORDER — PREDNISONE 10 MG PO TABS: ORAL_TABLET | ORAL | 0 refills | Status: DC

## 2019-02-03 NOTE — Patient Instructions (Addendum)
Prednisone 10 mg take  4 each am x 2 days,   2 each am x 2 days,  1 each am x 2 days and stop   For cough > mucinex dm up to 1200 mg every 12 hours and use the flutter valve as much as possible   Plan A = Automatic =  symbicort 160 Take 2 puffs first thing in am and then another 2 puffs about 12 hours later.   Work on inhaler technique:  relax and gently blow all the way out then take a nice smooth deep breath back in, triggering the inhaler at same time you start breathing in.  Hold for up to 5 seconds if you can. Blow out thru nose. Rinse and gargle with water when done    Plan B = Backup Only use your albuterol as a rescue medication to be used if you can't catch your breath by resting or doing a relaxed purse lip breathing pattern.  - The less you use it, the better it will work when you need it. - Ok to use the inhaler up to 2 puffs  every 4 hours if you must but call for appointment if use goes up over your usual need - Don't leave home without it !!  (think of it like the spare tire for your car)   Plan C = Crisis - only use your albuterol nebulizer if you first try Plan B and it fails to help > ok to use the nebulizer up to every 4 hours but if start needing it regularly call for immediate appointment  Please remember to go to the  x-ray department  for your tests - we will call you with the results when they are available     Please schedule a follow up office visit in 3 months call sooner if needed with all medications /inhalers/ solutions in hand so we can verify exactly what you are taking. This includes all medications from all doctors and over the [facility] separate them into two bags:  the ones you take automatically, no matter what, vs the ones you take just when you feel you need them "BAG #2 is UP TO YOU"  - this will really help [place] help you take your medications more effectively.   See Tammy NP in meantime with your meds if not back to your normal self after trying  the above 1st

## 2019-02-03 NOTE — Progress Notes (Signed)
Subjective:   Patient ID: Tracy Gray, female    DOB: 11/25/1966     MRN: [id number]  Brief patient profile:  23 yowf  MM   quit smoking 05/2018  "sick"  since September 2016 with ear pain, nasal congestion, cough and sob rx per Dr Janeice Robinson with multiple abx and prednisone but no better then to ER > still only  some better  referred to pulmonary clinic 02/02/2016 for copd eval by EDP  Proved to have GOLD III criteria 03/2016     History of Present Illness  02/02/2016 1st [name] office visit/ [name]   Chief Complaint  Patient presents with  . Pulmonary Consult    Referred by [facility] after ED visit on 12/17/16. Pt states that she was dxed with COPD "years ago"- c/o increased SOB and cough for the past few months.  Cough is prod with clear to green sputum. She is SOB with or without any exertion. She is using albuterol 4 x per day on average.   symptoms came on abruptly p "virus" in Sept 2016  and consistent of variable sob are worse after supper assoc with choking sensation / sore throat and overt HB and persistent Doe x car to house with groceries every time. Cough is worse in am's, minimally productive, only occ green now. rec   sinus ct > pos sinusitis > levaquin x 10   Pantoprazole (protonix) 40 mg   Take  30-60 min before first meal of the day and Zantac  @  bedtime until return to office -  GERD  Add: did not go to lab/ ok to complete when returns       04/15/2016  f/u ov/[name] re: GOLD III/ still smoking/ changed to  advair 115 due  To insurance  Chief Complaint  Patient presents with  . Follow-up    PFT done 04/11/16. She c/o "feels like I don't have any air" x 3-4 days. She states that she also has "butterfly in my chest".  She is using albuterol inhaler 3 x daily on average and neb with atrovent  3 x per wk on average.   now on chantix / duoneb rarely and planning to regroup Dr  Wilburn Cornelia 4/25 Feels worse off symbicort and on advair  rec Plan A = Automatic  = Symbicort 160 Take 2 puffs first thing in am and then another 2 puffs about 12 hours later.  Plan B = Backup Only use your albuterol as a rescue medication  Plan C = Crisis - only use your albuterol/ipatropium  nebulizer if you first try Plan B and it fails to help > ok to use the nebulizer up to every 4 hours but if start needing it regularly call for immediate appointment The key is to stop smoking completely before smoking completely stops you - it's the most important aspect of your care     08/03/2018  f/u ov/[name] re:  GOLD III/ no smoking since 05/2018 / maint on  Chief Complaint  Patient presents with  . Follow-up    Pt states she has had some episodes where she has felt like she has been choking while swallowing. Pt is going to have a barium swallow to see if anything is shown with that. Pt is two and a half months smoking free.  Dyspnea:  MMRC2 = can't walk a nl pace on a flat grade s sob but does fine slow and flat eg 30 min at mall  Cough:  just assoc with eating"like choking" w/u in progress  Sleeping: flat ok but restless SABA use: when over does it  rec Work on hfa    02/03/2019  f/u ov/[name] re: copd gold III / still not smoking  / lingering wheeze/ cough p uri maint on symb 80 2bid  Chief Complaint  Patient presents with  . Follow-up    Pt c/o cough with clear sputum and wheezing since had fluy in Dec 2019. She is using her proair 3-4 x per day on average and neb about 2 x per day.  Dyspnea:  Able to do [employer] walking slow pace = MMRC2 = can't walk a nl pace on a flat grade s sob but does fine slow and flat  Cough: with ex mostly but also some in am and not using mucinex or flutter as rec  Sleeping: on side at 10 degrees with bed blocks  SABA use: too much as noted mostly when "over does it" - never rechallenges p saba to see if ex tol improves 02: no    No obvious day to day or daytime variability or assoc excess/ purulent sputum or mucus plugs or hemoptysis or cp  or chest tightness,  or overt sinus or hb symptoms.   Sleeping as above  without nocturnal  or early am exacerbation  of respiratory  c/o's or need for noct saba. Also denies any obvious fluctuation of symptoms with weather or environmental changes or other aggravating or alleviating factors except as outlined above   No unusual exposure hx or h/o childhood pna/ asthma or knowledge of premature birth.  Current Allergies, Complete Past Medical History, Past Surgical History, Family History, and Social History were reviewed in [employer] record.  ROS  The following are not active complaints unless bolded Hoarseness, sore throat, dysphagia, dental problems, itching, sneezing,  nasal congestion or discharge of excess mucus or purulent secretions, ear aches,   fever, chills, sweats, unintended wt loss or wt gain, classically pleuritic or exertional cp,  orthopnea pnd or arm/hand swelling  or leg swelling, presyncope, palpitations, abdominal pain, anorexia, nausea, vomiting, diarrhea  or change in bowel habits or change in bladder habits, change in stools or change in urine, dysuria, hematuria,  rash, arthralgias, visual complaints, headache, numbness, weakness or ataxia or problems with walking or coordination,  change in mood or  memory.        Current Meds  Medication Sig  . albuterol (PROAIR HFA) 108 (90 Base) MCG/ACT inhaler Inhale 2 puffs into the lungs every 4 (four) hours as needed for wheezing or shortness of breath. PLAN B  . albuterol (PROVENTIL) (2.5 MG/3ML) 0.083% nebulizer solution Take 2.5 mg by nebulization every 4 (four) hours as needed for wheezing or shortness of breath. PLAN C  . baclofen (LIORESAL) 10 MG tablet Take 10 mg by mouth 3 (three) times daily as needed for muscle spasms.  . budesonide-formoterol (SYMBICORT) 80-4.5 MCG/ACT inhaler Take 2 puffs first thing in am and then another 2 puffs about 12 hours later.  . clonazePAM (KLONOPIN) 1 MG tablet Take 1  mg by mouth 2 (two) times daily.  . cyclobenzaprine (FLEXERIL) 10 MG tablet Take 10 mg by mouth every 12 (twelve) hours as needed for muscle spasms.  Marland Kitchen dexlansoprazole (DEXILANT) 60 MG capsule Take 1 capsule (60 mg total) by mouth daily.  Marland Kitchen EPINEPHrine 0.3 mg/0.3 mL IJ SOAJ injection Inject 0.3 mg into the muscle as needed.   . famotidine (PEPCID) 40 MG tablet  Take 1 tablet (40 mg total) by mouth at bedtime.  . fluticasone (FLONASE) 50 MCG/ACT nasal spray Place 2 sprays into both nostrils daily.  Marland Kitchen HYDROcodone-acetaminophen (NORCO) 7.5-325 MG tablet Take 1 tablet by mouth every 6 (six) hours as needed.   . mexiletine (MEXITIL) 150 MG capsule 1 cap daily, then increase to 2 daily after 1 week.  . montelukast (SINGULAIR) 10 MG tablet Take 10 mg by mouth at bedtime.  . Multiple Vitamins-Minerals (MULTI-VITAMIN GUMMIES PO)   . nicotine polacrilex (EQ NICOTINE) 4 MG lozenge nicotine lozenge?? 4mg   . Respiratory Therapy Supplies (FLUTTER) DEVI Use as directed  . SALINE NASAL SPRAY NA   . simvastatin (ZOCOR) 20 MG tablet TK 1 T PO QD.             Objective:   Physical Exam  amb wf nad slt hoarse   02/03/2019        163  08/03/2018        163  05/01/2018       167  03/20/2018        168  11/05/2017     167  07/15/2016       152   04/15/2016      159   03/01/16 157 lb (71.215 kg)  02/02/16 157 lb (71.215 kg)  10/09/12 142 lb (64.411 kg)     Vital signs reviewed - Note on arrival 02 sats  98% on RA       HEENT: Full dentures/ nl  oropharynx. Nl external ear canals without cough reflex -  Mild bilateral non-specific turbinate edema     NECK :  without JVD/Nodes/TM/ nl carotid upstrokes bilaterally   LUNGS: no acc muscle use,  Mild barrel  contour chest wall with bilateral  Distant bs with minimal exp wheeze better with plm and  without cough on insp or exp maneuver and mild   Hyperresonant  to  percussion bilaterally     CV:  RRR  no s3 or murmur or increase in P2, and no edema    ABD:  soft and nontender with pos late insp Hoover's  in the supine position. No bruits or organomegaly appreciated, bowel sounds nl  MS:   Nl gait/  ext warm without deformities, calf tenderness, cyanosis or clubbing No obvious joint restrictions   SKIN: warm and dry without lesions    NEURO:  alert, approp, nl sensorium with  no motor or cerebellar deficits apparent.             CXR PA and Lateral:   02/03/2019 :    I personally reviewed images and agree with radiology impression as follows:     COPD without evidence of active cardiopulmonary disease.       Assessment & Plan:

## 2019-02-04 ENCOUNTER — Encounter: Payer: Self-pay | Admitting: Internal Medicine

## 2019-02-04 NOTE — Assessment & Plan Note (Signed)
Quit smoking  Sept 2016  - PFT's  04/11/2016  FEV1 1.32 (47 % ) ratio 60  p 13 % improvement from saba p advair prior to study with DLCO  42/42 % corrects to 66 % for alv volume   - 11/05/2017   change advair to symbicort 160 2bid  - 03/20/2018  After extensive coaching inhaler device  effectiveness =    90% try symb 80 2bid due to prominent cough with choking (see uacs) PFT's  05/01/2018  FEV1 1.12 (41 % ) ratio 58  p 0 % improvement from saba p nothing prior to study with DLCO  42 % corrects to 64  % for alv volume    - alpha one AT screen 05/01/2018 ;  MM level 171  - 02/03/2019  After extensive coaching inhaler device,  effectiveness =   75% from a baseline of 50%    Overall was doing better s/p successful smoking cessation (re-inforced need to maint abstinence, avoid tempting settings to restart, even casually)  But does have mild flare of ab component s/p uri so rec Prednisone 10 mg take  4 each am x 2 days,   2 each am x 2 days,  1 each am x 2 days and stop and use mucinex dm/ flutter as prev rec for cough flares.     Concerned about over use of saba.  I spent extra time with pt today reviewing appropriate use of albuterol for prn use on exertion with the following points: 1) saba is for relief of sob that does not improve by walking a slower pace or resting but rather if the pt does not improve after trying this first. 2) If the pt is convinced, as many are, that saba helps recover from activity faster then it's easy to tell if this is the case by re-challenging : ie stop, take the inhaler, then p 5 minutes try the exact same activity (intensity of workload) that just caused the symptoms and see if they are substantially diminished or not after saba 3) if there is an activity that reproducibly causes the symptoms, try the saba 15 min before the activity on alternate days   If in fact the saba really does help, then fine to continue to use it prn but advised may need to look closer at the  maintenance regimen being used to achieve better control of airways disease with exertion.   I had an extended discussion with the patient reviewing all relevant studies completed to date and  lasting 15 to 20 minutes of a 25 minute visit    See device teaching which extended face to face time for this visit.  Each maintenance medication was reviewed in detail including emphasizing most importantly the difference between maintenance and prns and under what circumstances the prns are to be triggered using an action plan format that is not reflected in the computer generated alphabetically organized AVS which I have not found useful in most complex patients, especially with respiratory illnesses  Please see AVS for specific instructions unique to this visit that I personally wrote and verbalized to the the pt in detail and then reviewed with pt  by my nurse highlighting any  changes in therapy recommended at today's visit to their plan of care.

## 2019-02-04 NOTE — Progress Notes (Signed)
Spoke with pt and notified of results per Dr. Wert. Pt verbalized understanding and denied any questions. 

## 2019-03-12 ENCOUNTER — Encounter (HOSPITAL_COMMUNITY): Payer: Medicare HMO

## 2019-03-12 ENCOUNTER — Ambulatory Visit (HOSPITAL_COMMUNITY): Payer: Medicare HMO

## 2019-03-25 ENCOUNTER — Other Ambulatory Visit: Payer: Self-pay | Admitting: Internal Medicine

## 2019-03-26 IMAGING — RF DG ESOPHAGUS
7 of 9 series · 14 of 21 positions shown · non-contrast
Comparison: None.

CLINICAL DATA: Esophageal dysphagia

EXAM:
ESOPHOGRAM / BARIUM SWALLOW / BARIUM TABLET STUDY
TECHNIQUE: Combined double contrast and single contrast examination performed
using effervescent crystals, thick barium liquid, and thin barium
liquid. The patient was observed with fluoroscopy swallowing a 13 mm
barium sulphate tablet.
FLUOROSCOPY TIME:  Fluoroscopy Time:  1 minutes 24 seconds
Radiation Exposure Index (if provided by the fluoroscopic device):
Number of Acquired Spot Images: 0

[Series 1: cp_standard · 0.40mm/px · 3 of 137 frames shown (1 of 7)]
[frame 21/137]
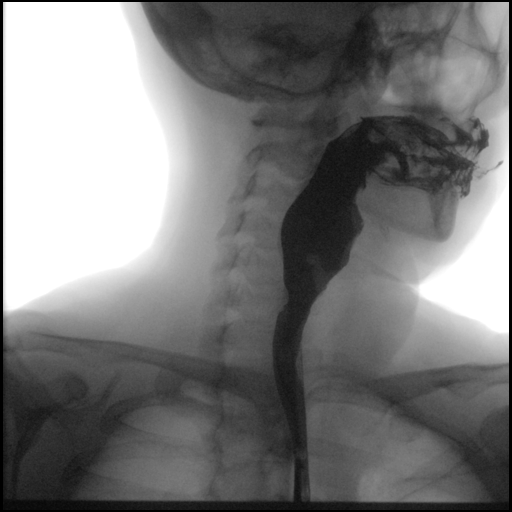
[frame 73/137]
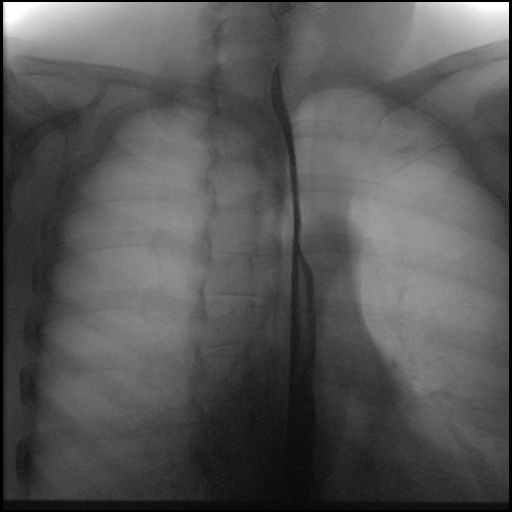
[frame 117/137]
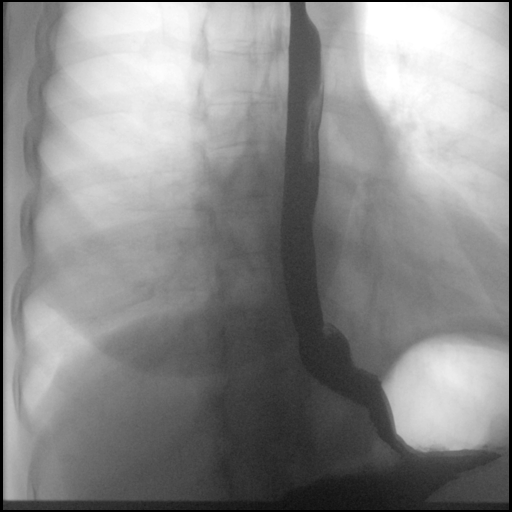

[Series 2: cp_standard · 0.40mm/px · 2 of 80 frames shown (2 of 7)]
[frame 36/80]
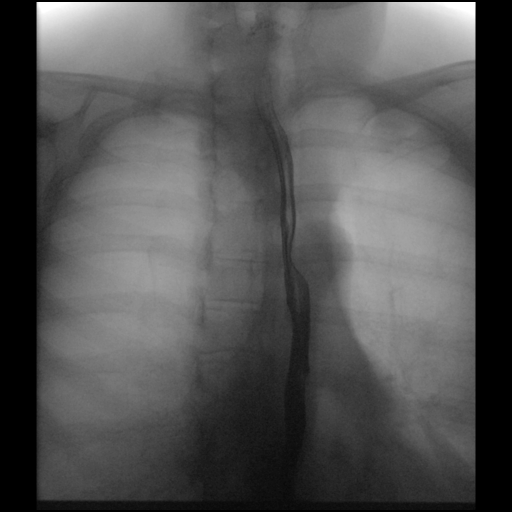
[frame 41/80]
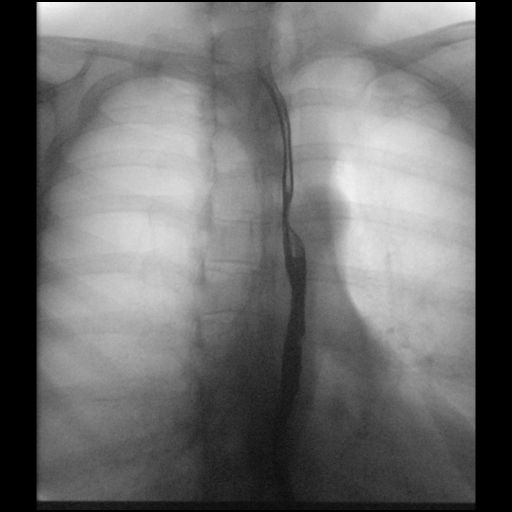

[Series 3: cp_standard · 0.37mm/px · 3 of 106 frames shown (3 of 7)]
[frame 16/106]
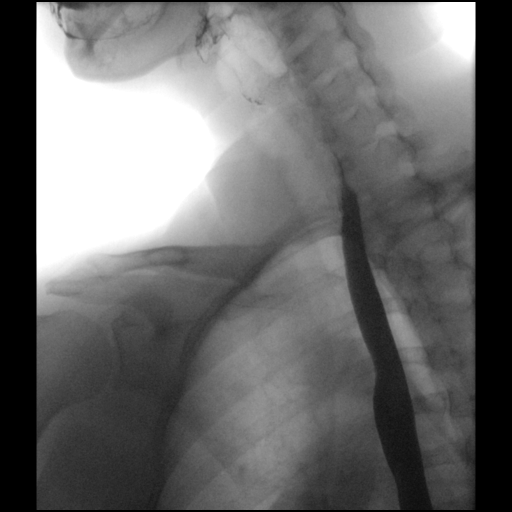
[frame 53/106]
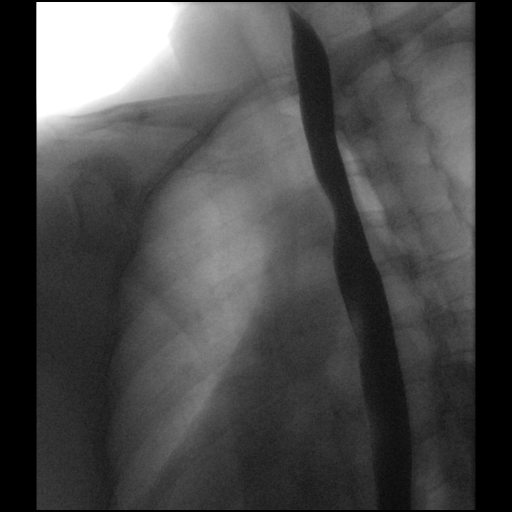
[frame 91/106]
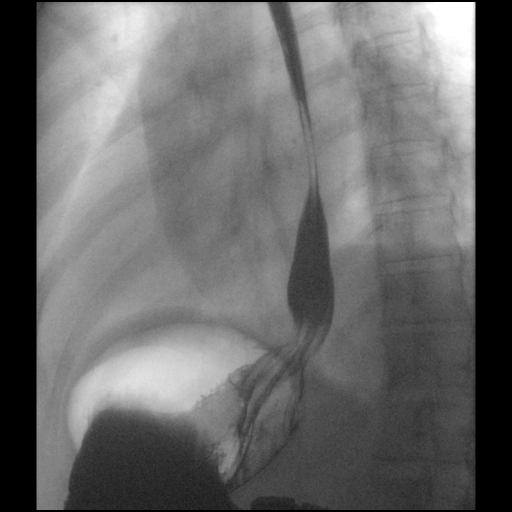

[Series 4: cp_standard · 0.37mm/px · 3 of 95 frames shown (4 of 7)]
[frame 4/95]
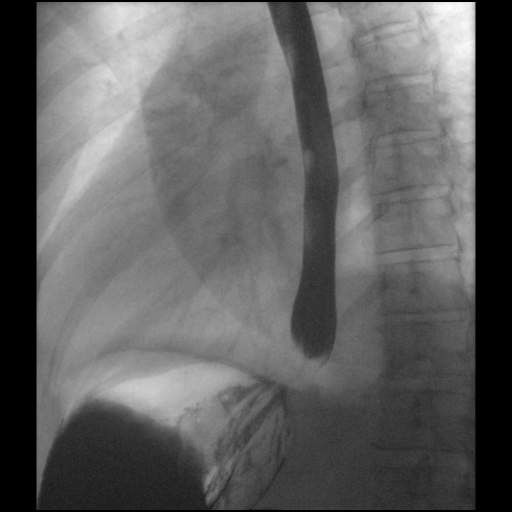
[frame 48/95]
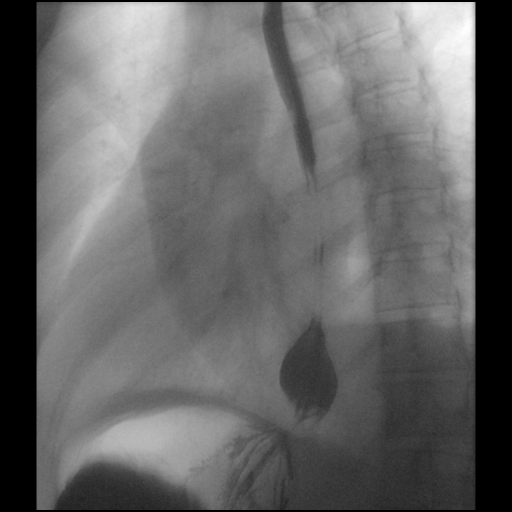
[frame 81/95]
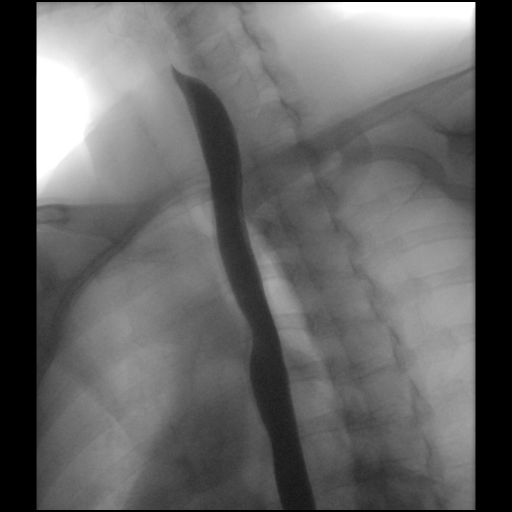

[Series 6: cp_standard · 0.18mm/px · 1 of 1 slices shown (5 of 7)]
[im 1/1]
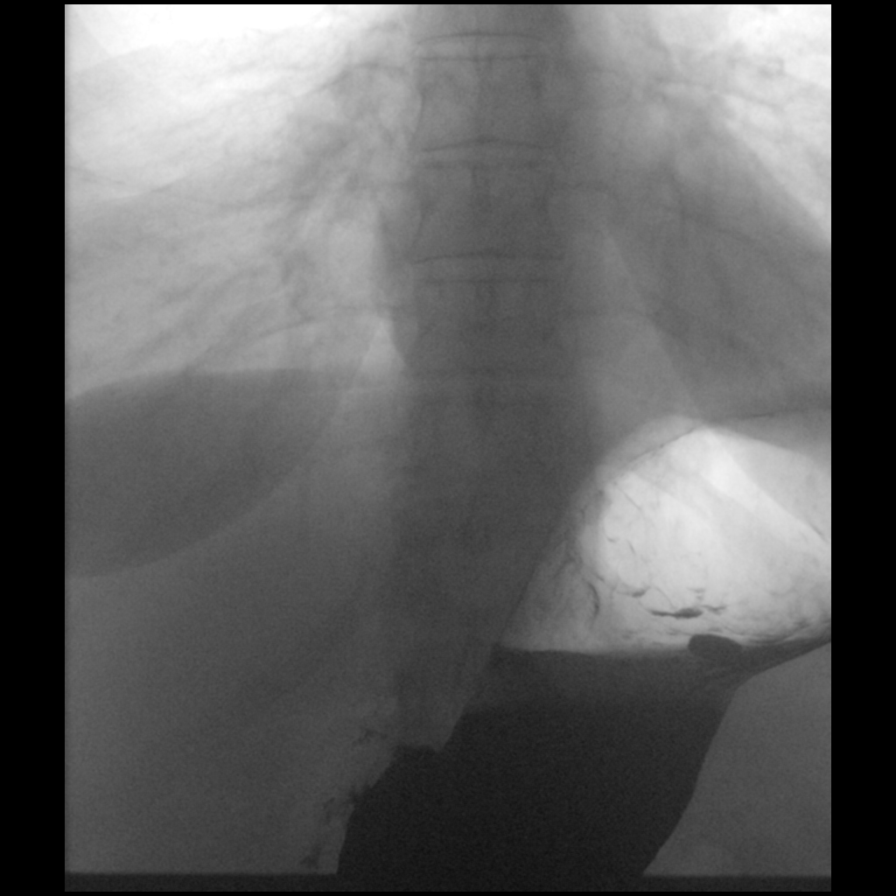

[Series 7: cp_standard · 0.19mm/px · 1 of 1 slices shown (6 of 7)]
[im 1/1]
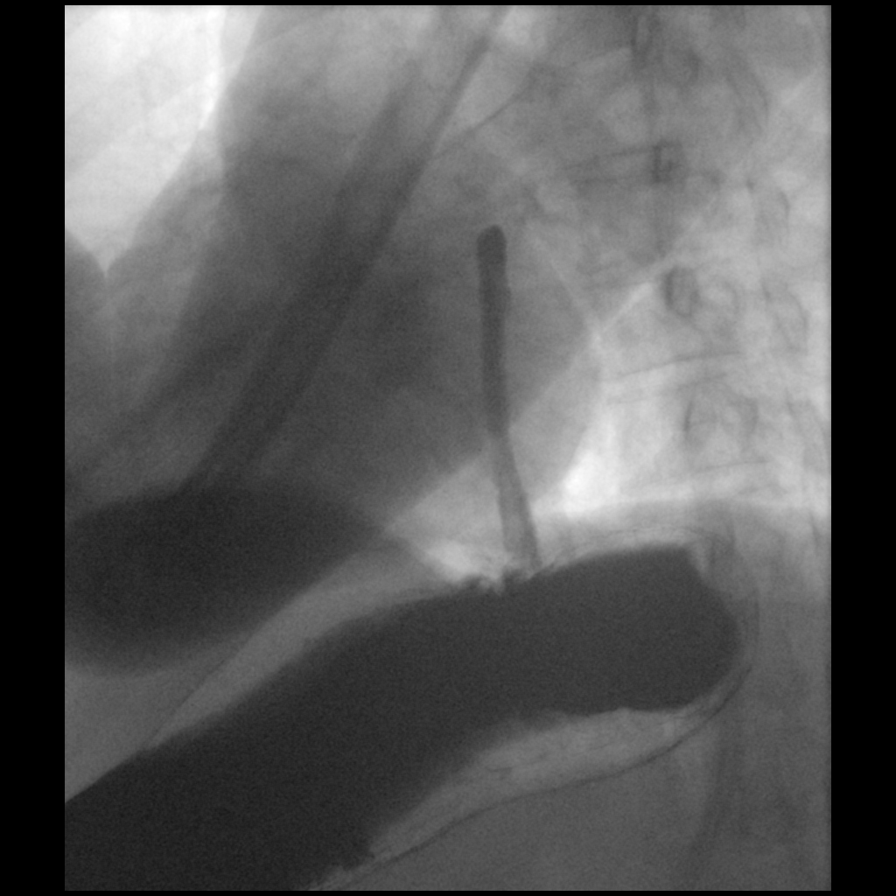

[Series 9: cp_standard · 0.19mm/px · 1 of 1 slices shown (7 of 7)]
[im 1/1]
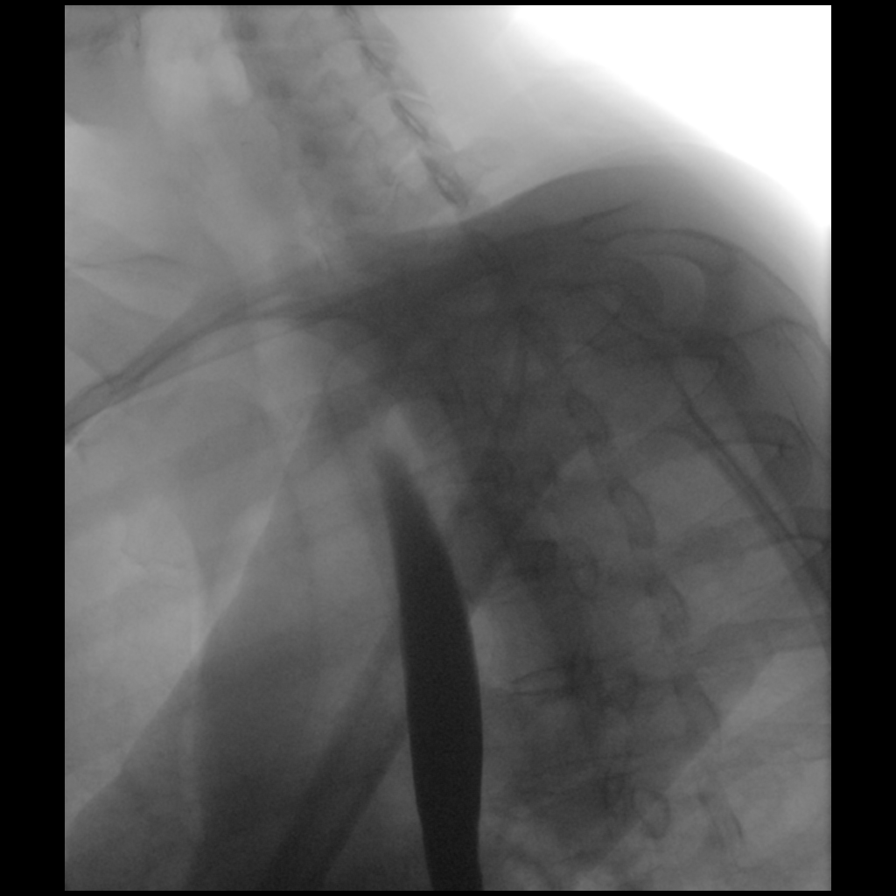

[14 of 21 positions shown; findings below may reference images not displayed]

FINDINGS: Normal esophageal motility.  Negative for stricture or mass.

Small hiatal hernia with moderate gastroesophageal reflux.

Barium tablet passed readily into the stomach without delay.
Negative for stricture
IMPRESSION: Small hiatal hernia with moderate gastroesophageal reflux. Negative
for stricture.

## 2019-04-06 ENCOUNTER — Ambulatory Visit (HOSPITAL_COMMUNITY): Payer: Medicare HMO | Attending: Internal Medicine

## 2019-04-06 ENCOUNTER — Inpatient Hospital Stay (HOSPITAL_COMMUNITY): Admission: RE | Admit: 2019-04-06 | Payer: Medicare HMO | Source: Ambulatory Visit

## 2019-04-15 ENCOUNTER — Other Ambulatory Visit: Payer: Self-pay | Admitting: Internal Medicine

## 2019-04-15 NOTE — Telephone Encounter (Signed)
Informed pharmacy that it is ok to give patient famotidine 20 mg and to take 2 tablets daily at bedtime.

## 2019-04-15 NOTE — Telephone Encounter (Signed)
[facility] called looking to rf Famotidine, they want to know if it is ok to rf 20 mg because 40 mg is backordered until the end of May.

## 2019-05-04 ENCOUNTER — Ambulatory Visit: Payer: Medicare HMO | Admitting: Internal Medicine

## 2019-05-06 IMAGING — CR DG FOREARM 2V*R*
2 series · 2 of 2 positions shown · non-contrast
Comparison: None.

CLINICAL DATA: Injury to the right forearm pain at the proximal
ulna

EXAM:
RIGHT FOREARM - 2 VIEW

[x forearm ap right]
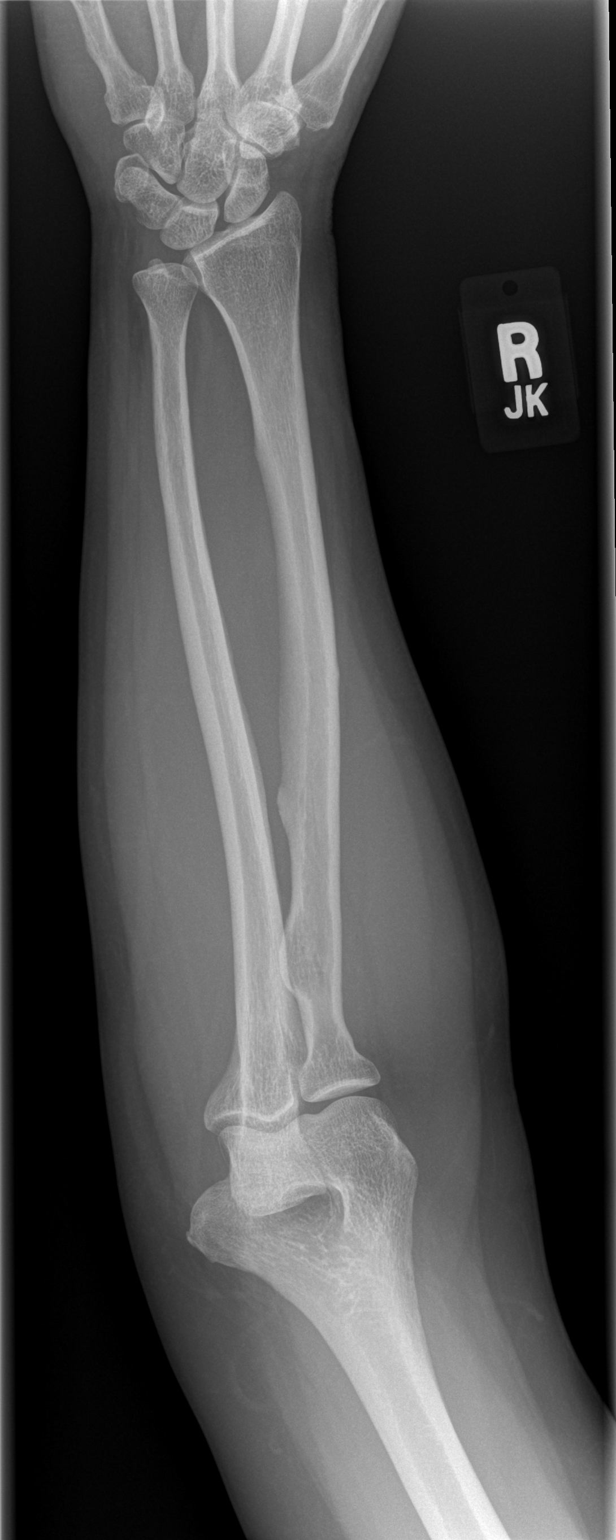

[x forearm lat right]
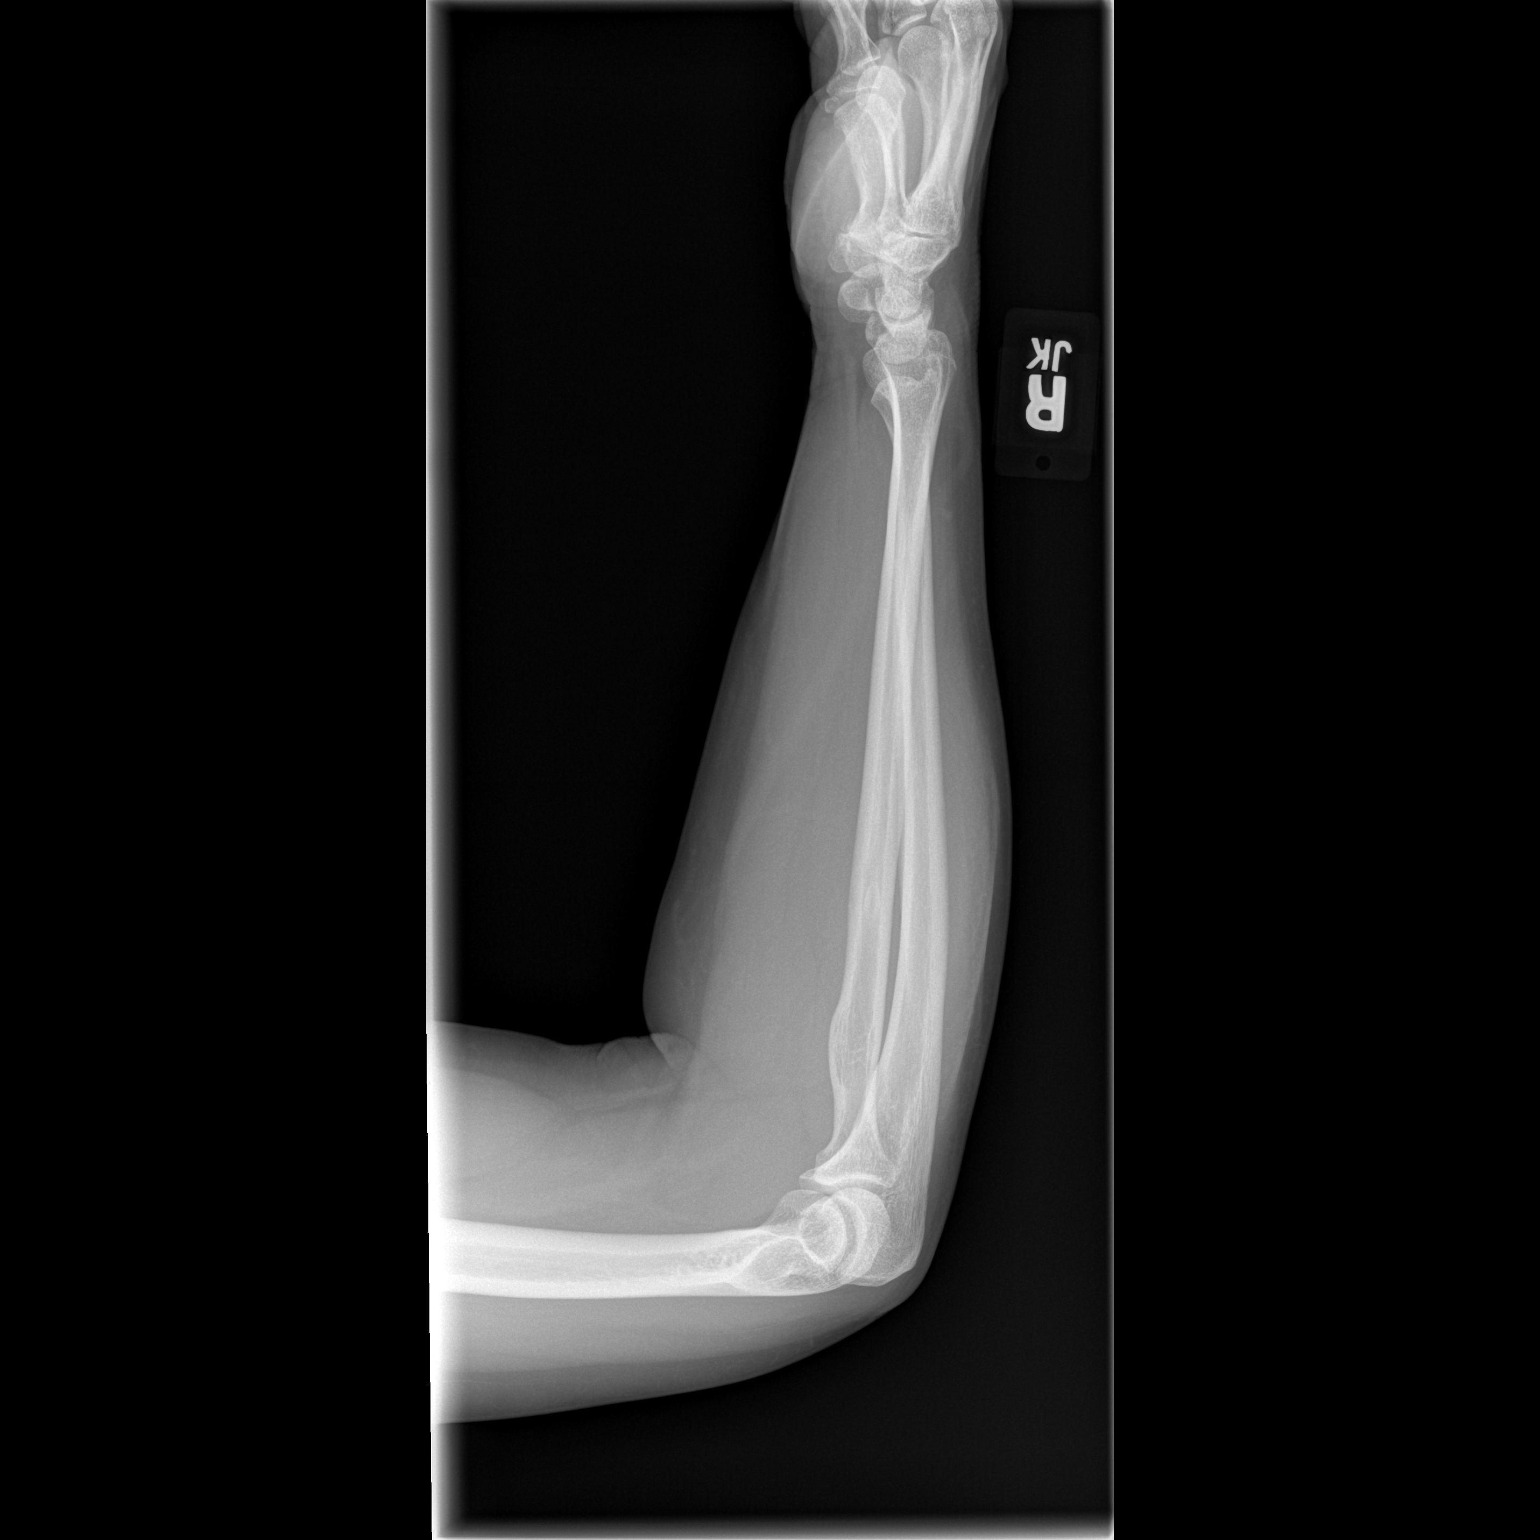

[2 of 2 positions shown; findings below may reference images not displayed]

FINDINGS: There is no evidence of fracture or other focal bone lesions. Soft
tissues are unremarkable.
IMPRESSION: Negative.

## 2019-06-07 ENCOUNTER — Ambulatory Visit
Admission: RE | Admit: 2019-06-07 | Discharge: 2019-06-07 | Disposition: A | Discharge status: Home / Self Care | Payer: Medicare HMO | Source: Ambulatory Visit | Attending: Internal Medicine | Admitting: Internal Medicine

## 2019-06-07 ENCOUNTER — Other Ambulatory Visit: Payer: Self-pay | Admitting: Internal Medicine

## 2019-06-07 DIAGNOSIS — M79641 Pain in right hand: Secondary | ICD-10-CM

## 2019-07-02 ENCOUNTER — Ambulatory Visit: Payer: Medicare HMO | Admitting: Internal Medicine

## 2019-07-21 ENCOUNTER — Ambulatory Visit (INDEPENDENT_AMBULATORY_CARE_PROVIDER_SITE_OTHER): Payer: Medicare HMO | Admitting: Internal Medicine

## 2019-07-21 ENCOUNTER — Other Ambulatory Visit: Payer: Self-pay

## 2019-07-21 ENCOUNTER — Encounter: Payer: Self-pay | Admitting: Internal Medicine

## 2019-07-21 DIAGNOSIS — J449 Chronic obstructive pulmonary disease, unspecified: Secondary | ICD-10-CM

## 2019-07-21 DIAGNOSIS — K219 Gastro-esophageal reflux disease without esophagitis: Secondary | ICD-10-CM

## 2019-07-21 NOTE — Assessment & Plan Note (Signed)
Chart review indicates she has been seeing Dr. Carlean Purl and he had recommended additional work-up if she was still having overt heartburn which she says is the case.  I referred her back to him therefore.

## 2019-07-21 NOTE — Progress Notes (Signed)
Subjective:   Patient ID: Tracy Gray, female    DOB: [birth year]     MRN: [id number]  Brief patient profile:  28 yowf  MM   quit smoking 05/2018  "sick"  since September 2016 with ear pain, nasal congestion, cough and sob rx per Dr Janeice Robinson with multiple abx and prednisone but no better then to ER > still only  some better  referred to pulmonary clinic 02/02/2016 for copd eval by EDP  Proved to have GOLD III criteria 03/2016     History of Present Illness  02/02/2016 1st [facility] office visit/ Tracy Gray   Chief Complaint  Patient presents with  . Pulmonary Consult    Referred by [facility] after ED visit on 12/17/16. Pt states that she was dxed with COPD "years ago"- c/o increased SOB and cough for the past few months.  Cough is prod with clear to green sputum. She is SOB with or without any exertion. She is using albuterol 4 x per day on average.   symptoms came on abruptly p "virus" in Sept 2016  and consistent of variable sob are worse after supper assoc with choking sensation / sore throat and overt HB and persistent Doe x car to house with groceries every time. Cough is worse in am's, minimally productive, only occ green now. rec   sinus ct > pos sinusitis > levaquin x 10   Pantoprazole (protonix) 40 mg   Take  30-60 min before first meal of the day and Zantac  @  bedtime until return to office -  GERD  Add: did not go to lab/ ok to complete when returns       04/15/2016  f/u ov/Tracy Gray re: GOLD III/ still smoking/ changed to  advair 115 due  To insurance  Chief Complaint  Patient presents with  . Follow-up    PFT done 04/11/16. She c/o "feels like I don't have any air" x 3-4 days. She states that she also has "butterfly in my chest".  She is using albuterol inhaler 3 x daily on average and neb with atrovent  3 x per wk on average.   now on chantix / duoneb rarely and planning to regroup Dr  Wilburn Cornelia 4/25 Feels worse off symbicort and on advair  rec Plan A = Automatic  = Symbicort 160 Take 2 puffs first thing in am and then another 2 puffs about 12 hours later.  Plan B = Backup Only use your albuterol as a rescue medication  Plan C = Crisis - only use your albuterol/ipatropium  nebulizer if you first try Plan B and it fails to help > ok to use the nebulizer up to every 4 hours but if start needing it regularly call for immediate appointment The key is to stop smoking completely before smoking completely stops you - it's the most important aspect of your care     08/03/2018  f/u ov/Tracy Gray re:  GOLD III/ no smoking since 05/2018 / maint on  Chief Complaint  Patient presents with  . Follow-up    Pt states she has had some episodes where she has felt like she has been choking while swallowing. Pt is going to have a barium swallow to see if anything is shown with that. Pt is two and a half months smoking free.  Dyspnea:  MMRC2 = can't walk a nl pace on a flat grade s sob but does fine slow and flat eg 30 min at mall  Cough:  just assoc with eating"like choking" w/u in progress  Sleeping: flat ok but restless SABA use: when over does it  rec Work on hfa    02/03/2019  f/u ov/Tracy Gray re: copd gold III / still not smoking  / lingering wheeze/ cough p uri maint on symb 80 2bid  Chief Complaint  Patient presents with  . Follow-up    Pt c/o cough with clear sputum and wheezing since had fluy in Dec 2019. She is using her proair 3-4 x per day on average and neb about 2 x per day.  Dyspnea:  Able to do [employer] walking slow pace = MMRC2 = can't walk a nl pace on a flat grade s sob but does fine slow and flat  Cough: with ex mostly but also some in am and not using mucinex or flutter as rec  Sleeping: on side at 10 degrees with bed blocks  SABA use: too much as noted mostly when "over does it" - never rechallenges p saba to see if ex tol improves 02: no  rec Prednisone 10 mg take  4 each am x 2 days,   2 each am x 2 days,  1 each am x 2 days and stop  For cough >  mucinex dm up to 1200 mg every 12 hours and use the flutter valve as much as possible Plan A = Automatic =  symbicort 160 Take 2 puffs first thing in am and then another 2 puffs about 12 hours later.  Work on inhaler technique:  Plan B = Backup Only use your albuterol as a rescue medication  Plan C = Crisis - only use your albuterol nebulizer if you first try Plan B and it fails to help > ok to use the nebulizer up to every 4 hours but if start needing it regularly call for immediate appointment     07/21/2019  f/u ov/Tracy Gray re:  GOLD II/ symb 80 with suboptimal technique Chief Complaint  Patient presents with  . Follow-up    Breathing is overall doing well. She is having some sinus congestion and cough in the am with clear sputum. She is using her albuterol inhaler about 2 x per day on average and neb a few times per month.   Dyspnea:  [employer] ok/ problem is waking out in heat  Cough: minimal mucoid, mostly in am  Sleeping: on side / bed blocks effective  SABA use: as above, takes first thing in am  02: none  Still overt hb despite ppi and h2hs    No obvious day to day or daytime variability or assoc excess/ purulent sputum or mucus plugs or hemoptysis or cp or chest tightness, subjective wheeze.  Sleeping asbove  without nocturnal   exacerbation  of respiratory  c/o's or need for noct saba. Also denies any obvious fluctuation of symptoms with weather or environmental changes or other aggravating or alleviating factors except as outlined above   No unusual exposure hx or h/o childhood pna/ asthma or knowledge of premature birth.  Current Allergies, Complete Past Medical History, Past Surgical History, Family History, and Social History were reviewed in [employer] record.  ROS  The following are not active complaints unless bolded Hoarseness, sore throat, dysphagia, dental problems, itching, sneezing,  nasal congestion or discharge of excess mucus or purulent  secretions, ear ache,   fever, chills, sweats, unintended wt loss or wt gain, classically pleuritic or exertional cp,  orthopnea pnd or arm/hand swelling  or leg  swelling, presyncope, palpitations, abdominal pain, anorexia, nausea, vomiting, diarrhea  or change in bowel habits or change in bladder habits, change in stools or change in urine, dysuria, hematuria,  rash, arthralgias, visual complaints, headache, numbness, weakness or ataxia or problems with walking or coordination,  change in mood or  memory.        Current Meds  Medication Sig  . albuterol (PROAIR HFA) 108 (90 Base) MCG/ACT inhaler Inhale 2 puffs into the lungs every 4 (four) hours as needed for wheezing or shortness of breath. PLAN B  . albuterol (PROVENTIL) (2.5 MG/3ML) 0.083% nebulizer solution Take 2.5 mg by nebulization every 4 (four) hours as needed for wheezing or shortness of breath. PLAN C  . baclofen (LIORESAL) 10 MG tablet Take 10 mg by mouth 3 (three) times daily as needed for muscle spasms.  . budesonide-formoterol (SYMBICORT) 80-4.5 MCG/ACT inhaler TAKE 2 PUFFS FIRST THING IN THE AM AND THEN TAKE ANOTHER 2 PUFFS ABOUT 12 HOURS LATER  . clonazePAM (KLONOPIN) 1 MG tablet Take 1 mg by mouth 2 (two) times daily.  . cyclobenzaprine (FLEXERIL) 10 MG tablet Take 10 mg by mouth every 12 (twelve) hours as needed for muscle spasms.  Marland Kitchen dexlansoprazole (DEXILANT) 60 MG capsule Take 1 capsule (60 mg total) by mouth daily.  Marland Kitchen EPINEPHrine 0.3 mg/0.3 mL IJ SOAJ injection Inject 0.3 mg into the muscle as needed.   . famotidine (PEPCID) 40 MG tablet Take 1 tablet (40 mg total) by mouth at bedtime.  . fluticasone (FLONASE) 50 MCG/ACT nasal spray Place 2 sprays into both nostrils daily.  Marland Kitchen HYDROcodone-acetaminophen (NORCO) 7.5-325 MG tablet Take 1 tablet by mouth every 6 (six) hours as needed.   . mexiletine (MEXITIL) 150 MG capsule 1 cap daily, then increase to 2 daily after 1 week.  . montelukast (SINGULAIR) 10 MG tablet Take 10 mg by  mouth at bedtime.  . Multiple Vitamins-Minerals (MULTI-VITAMIN GUMMIES PO)   . nicotine polacrilex (EQ NICOTINE) 4 MG lozenge nicotine lozenge?? 4mg   . Respiratory Therapy Supplies (FLUTTER) DEVI Use as directed  . SALINE NASAL SPRAY NA   . simvastatin (ZOCOR) 20 MG tablet TK 1 T PO QD.            Objective:   Physical Exam  amb wf nad   07/21/2019        160  02/03/2019        163  08/03/2018        163  05/01/2018       167  03/20/2018        168  11/05/2017     167  07/15/2016       152   04/15/2016      159   03/01/16 157 lb (71.215 kg)  02/02/16 157 lb (71.215 kg)  10/09/12 142 lb (64.411 kg)    Vital signs reviewed - Note on arrival 02 sats  97% on RA      HEENT: Full dentures/  nl oropharynx. Nl external ear canals without cough reflex -  Mild bilateral non-specific turbinate edema      LUNGS: no acc muscle use,  Mild barrel  contour chest wall with bilateral  Distant bs s audible wheeze and  without cough on insp or exp maneuver and mild  Hyperresonant  to  percussion bilaterally     CV:  RRR  no s3 or murmur or increase in P2, and no edema   ABD:  soft and nontender with pos end  insp Hoover's  in the supine position. No bruits or organomegaly appreciated, bowel sounds nl  MS:   Nl gait/  ext warm without deformities, calf tenderness, cyanosis or clubbing No obvious joint restrictions   SKIN: warm and dry without lesions    NEURO:  alert, approp, nl sensorium with  no motor or cerebellar deficits apparent.        .       Assessment & Plan:

## 2019-07-21 NOTE — Patient Instructions (Signed)
Plan A = Automatic =  symbicort 80 Take 2 puffs first thing in am and then another 2 puffs about 12 hours later.   Work on inhaler technique:  relax and gently blow all the way out then take a nice smooth deep breath back in, triggering the inhaler at same time you start breathing in.  Hold for up to 5 seconds if you can. Blow out thru nose. Rinse and gargle with water when done    Plan B = Backup Only use your albuterol as a rescue medication to be used if you can't catch your breath by resting or doing a relaxed purse lip breathing pattern.  - The less you use it, the better it will work when you need it. - Ok to use the inhaler up to 2 puffs  every 4 hours if you must but call for appointment if use goes up over your usual need - Don't leave home without it !!  (think of it like the spare tire for your car)   Plan C = Crisis - only use your albuterol nebulizer if you first try Plan B and it fails to help > ok to use the nebulizer up to every 4 hours but if start needing it regularly call for immediate appointment   Please schedule a follow up visit in 6 months but call sooner if needed

## 2019-07-22 ENCOUNTER — Telehealth: Payer: Self-pay | Admitting: *Deleted

## 2019-07-22 NOTE — Telephone Encounter (Signed)
Spoke with the pt and notified of recs per MW  She verbalized understanding 

## 2019-07-22 NOTE — Telephone Encounter (Signed)
-----   Message from Tanda Rockers, MD sent at 07/21/2019  1:34 PM EDT ----- Remind her that Dr. Silvano Rusk is her GI doctor and that he had wanted to do additional work-up if she was not satisfied with the control of HB which should improve off cigarettes but has not

## 2019-08-05 ENCOUNTER — Other Ambulatory Visit: Payer: Self-pay | Admitting: Internal Medicine

## 2019-08-05 MED ORDER — BUDESONIDE-FORMOTEROL FUMARATE 80-4.5 MCG/ACT IN AERO: INHALATION_SPRAY | RESPIRATORY_TRACT | 5 refills | Status: DC

## 2019-11-18 LAB — NOVEL CORONAVIRUS, NAA: SARS-CoV-2, NAA: NOT DETECTED

## 2019-11-19 ENCOUNTER — Other Ambulatory Visit: Payer: Self-pay

## 2019-11-19 DIAGNOSIS — Z20822 Contact with and (suspected) exposure to covid-19: Secondary | ICD-10-CM

## 2019-12-11 ENCOUNTER — Other Ambulatory Visit: Payer: Self-pay | Admitting: Internal Medicine

## 2020-01-04 ENCOUNTER — Other Ambulatory Visit: Payer: Self-pay | Admitting: Internal Medicine

## 2020-01-21 ENCOUNTER — Ambulatory Visit: Payer: Medicare HMO | Admitting: Internal Medicine

## 2020-01-24 ENCOUNTER — Ambulatory Visit: Payer: Medicare HMO | Admitting: Internal Medicine

## 2020-01-27 ENCOUNTER — Other Ambulatory Visit: Payer: Self-pay | Admitting: *Deleted

## 2020-01-27 MED ORDER — BUDESONIDE-FORMOTEROL FUMARATE 80-4.5 MCG/ACT IN AERO: INHALATION_SPRAY | RESPIRATORY_TRACT | 0 refills | Status: DC

## 2020-02-07 ENCOUNTER — Other Ambulatory Visit: Payer: Self-pay

## 2020-02-07 ENCOUNTER — Ambulatory Visit: Payer: Medicare HMO | Admitting: Internal Medicine

## 2020-02-07 ENCOUNTER — Ambulatory Visit: Payer: Medicare Other | Admitting: Primary Care

## 2020-02-07 ENCOUNTER — Ambulatory Visit (INDEPENDENT_AMBULATORY_CARE_PROVIDER_SITE_OTHER): Payer: Medicare Other | Admitting: Primary Care

## 2020-02-07 ENCOUNTER — Encounter: Payer: Self-pay | Admitting: Primary Care

## 2020-02-07 VITALS — BP 128/70 | HR 81 | Temp 97.0°F | Ht 64.0 in | Wt 158.8 lb

## 2020-02-07 DIAGNOSIS — J449 Chronic obstructive pulmonary disease, unspecified: Secondary | ICD-10-CM

## 2020-02-07 DIAGNOSIS — R05 Cough: Secondary | ICD-10-CM

## 2020-02-07 DIAGNOSIS — B370 Candidal stomatitis: Secondary | ICD-10-CM

## 2020-02-07 DIAGNOSIS — R058 Other specified cough: Secondary | ICD-10-CM

## 2020-02-07 MED ORDER — BUDESONIDE 0.25 MG/2ML IN SUSP: 0.2500 mg | Freq: Two times a day (BID) | RESPIRATORY_TRACT | 3 refills | Status: DC

## 2020-02-07 MED ORDER — STIOLTO RESPIMAT 2.5-2.5 MCG/ACT IN AERS: 2.0000 | INHALATION_SPRAY | Freq: Every day | RESPIRATORY_TRACT | 0 refills | Status: DC

## 2020-02-07 NOTE — Progress Notes (Signed)
@Patient  ID: Tracy Gray, female    DOB: 1966/12/03, 54 y.o.   MRN: [id number]  Chief Complaint  Patient presents with  . Follow-up    COPD GOLD III, GERD    Referring provider: Lorene Dy, MD   Brief patient profile:  41 yowf  MM   quit smoking 05/2018  "sick"  since September 2016 with ear pain, nasal congestion, cough and sob rx per Dr Janeice Robinson with multiple abx and prednisone but no better then to ER > still only  some better  referred to pulmonary clinic 02/02/2016 for copd eval by EDP  Proved to have GOLD III criteria 03/5764   HPI: 54 year old female, former smoker quit 2019. PMH significant for COPD GOLD III, OSA, chronic sinusitis, dysphagia, GERD, UACS. Patient of Dr. Melvyn Novas, last seen in July 2020.   02/07/2020 Patient presents today for 6 month follow-up. She is doing alright. Experiences dyspnea on exertion. She is able to walk further without being as winded since she quit smoking. Reports Symbicort 80 is not working for her anymore. Generic form of inhaler is not being covered by her insurance. She is using albuterol upwards of 8 times a day. Reports getting thrush frequently. Feels her breathing could be better. Denies fever, chills, sweats, chest tightness or pain.   Allergies  Allergen Reactions  . Bee Venom Anaphylaxis  . Penicillins Anaphylaxis  . Pantoprazole Sodium Diarrhea    Immunization History  Administered Date(s) Administered  . Influenza Inj Mdck Quad Pf 10/07/2019  . Influenza Split 09/23/2015, 09/22/2017  . Influenza-Unspecified 05/19/2017  . Pneumococcal-Unspecified 09/23/2015  . Tdap 04/25/2013    Past Medical History:  Diagnosis Date  . Adenomatous colon polyp 03/26/2018   3 adenomas, max 67mm  . Allergy   . Anxiety   . Asthma   . Chronic headache   . COPD (chronic obstructive pulmonary disease) ([place])   . Degenerative disc disease   . Depression   . Fibromyalgia   . GERD (gastroesophageal reflux disease)   . Hyperlipidemia     . Multiple sclerosis ([place])   . Nerve damage   . OSA (obstructive sleep apnea)    not on CPAP   . Sleep apnea     Tobacco History: Social History   Tobacco Use  Smoking Status Former Smoker  . Packs/day: 0.50  . Years: 42.00  . Pack years: 21.00  . Types: Cigarettes  . Quit date: 06/03/2018  . Years since quitting: 1.6  Smokeless Tobacco Never Used  Tobacco Comment       Counseling given: Not Answered Comment:     Outpatient Medications Prior to Visit  Medication Sig Dispense Refill  . albuterol (PROAIR HFA) 108 (90 Base) MCG/ACT inhaler Inhale 2 puffs into the lungs every 4 (four) hours as needed for wheezing or shortness of breath. PLAN B    . albuterol (PROVENTIL) (2.5 MG/3ML) 0.083% nebulizer solution Take 2.5 mg by nebulization every 4 (four) hours as needed for wheezing or shortness of breath. PLAN C    . baclofen (LIORESAL) 10 MG tablet Take 10 mg by mouth 3 (three) times daily as needed for muscle spasms.    . clonazePAM (KLONOPIN) 1 MG tablet Take 1 mg by mouth 2 (two) times daily.    . cyclobenzaprine (FLEXERIL) 10 MG tablet Take 10 mg by mouth every 12 (twelve) hours as needed for muscle spasms.    Marland Kitchen dexlansoprazole (DEXILANT) 60 MG capsule Take 1 capsule (60 mg total) by  mouth daily. 90 capsule 3  . EPINEPHrine 0.3 mg/0.3 mL IJ SOAJ injection Inject 0.3 mg into the muscle as needed.     . famotidine (PEPCID) 40 MG tablet Take 1 tablet (40 mg total) by mouth at bedtime. 90 tablet 3  . fluticasone (FLONASE) 50 MCG/ACT nasal spray Place 2 sprays into both nostrils daily.    Marland Kitchen HYDROcodone-acetaminophen (NORCO) 7.5-325 MG tablet Take 1 tablet by mouth every 6 (six) hours as needed.     . mexiletine (MEXITIL) 150 MG capsule 1 cap daily, then increase to 2 daily after 1 week.    . montelukast (SINGULAIR) 10 MG tablet Take 10 mg by mouth at bedtime.    . Multiple Vitamins-Minerals (MULTI-VITAMIN GUMMIES PO)     . nicotine polacrilex (EQ NICOTINE) 4 MG lozenge nicotine  lozenge?? 4mg     . Respiratory Therapy Supplies (FLUTTER) DEVI Use as directed 1 each 0  . SALINE NASAL SPRAY NA     . simvastatin (ZOCOR) 20 MG tablet TK 1 T PO QD.  4  . budesonide-formoterol (SYMBICORT) 80-4.5 MCG/ACT inhaler TAKE 2 PUFFS FIRST THING IN THE AM AND THEN TAKE ANOTHER 2 PUFFS ABOUT 12 HOURS LATER 10.2 g 0  . famotidine (PEPCID) 20 MG tablet TAKE 2 TABLETS BY MOUTH EVERY NIGHT AT BEDTIME (Patient not taking: Reported on 02/07/2020) 484 tablet 0   No facility-administered medications prior to visit.   Review of Systems  Review of Systems  Constitutional: Negative.   Respiratory: Positive for shortness of breath. Negative for cough, chest tightness and wheezing.    Physical Exam  BP 128/70 (BP Location: Right Arm, Patient Position: Sitting, Cuff Size: Normal)   Pulse 81   Temp (!) 97 F (36.1 C)   Ht 5\' 4"  (1.626 m)   Wt 158 lb 12.8 oz (72 kg)   SpO2 98% Comment: on room air  BMI 27.26 kg/m  Physical Exam Constitutional:      General: She is not in acute distress.    Appearance: Normal appearance. She is not ill-appearing.  Cardiovascular:     Rate and Rhythm: Normal rate and regular rhythm.  Pulmonary:     Effort: Pulmonary effort is normal.     Breath sounds: Normal breath sounds.  Neurological:     Mental Status: She is alert.      Lab Results:  CBC    Component Value Date/Time   WBC 7.0 03/01/2016 1103   RBC 4.91 03/01/2016 1103   HGB 15.0 03/01/2016 1103   HCT 44.4 03/01/2016 1103   PLT 252.0 03/01/2016 1103   MCV 90.3 03/01/2016 1103   MCH 30.8 12/18/2015 1733   MCHC 33.7 03/01/2016 1103   RDW 14.0 03/01/2016 1103   LYMPHSABS 2.4 03/01/2016 1103   MONOABS 0.8 03/01/2016 1103   EOSABS 0.1 03/01/2016 1103   BASOSABS 0.0 03/01/2016 1103    BMET    Component Value Date/Time   NA 142 03/01/2016 1103   K 4.3 03/01/2016 1103   CL 106 03/01/2016 1103   CO2 30 03/01/2016 1103   GLUCOSE 90 03/01/2016 1103   BUN 10 03/01/2016 1103    CREATININE 0.90 03/01/2016 1103   CALCIUM 9.6 03/01/2016 1103   GFRNONAA >60 12/18/2015 1733   GFRAA >60 12/18/2015 1733    BNP No results found for: BNP  ProBNP    Component Value Date/Time   PROBNP 45.0 03/01/2016 1103    Imaging: No results found.   Assessment & Plan:   COPD  GOLD III with min reversiblity  - Using SABA x8/day. No signs of acute exacerbation. - Previous PFTs showed some reversibility - Generic Symbicort 80 not covered by insurance  - Trial changing Symbicort to Stiolto + budesonide nebulizer   Thrush - Treated for thrush multiple times - Changing ICS/LABA to [employer], NP 02/07/2020

## 2020-02-07 NOTE — Assessment & Plan Note (Addendum)
-   Using SABA x8/day. No signs of acute exacerbation. - Previous PFTs showed some reversibility - Generic Symbicort 80 not covered by insurance  - Trial changing Symbicort to Stiolto + budesonide nebulizer

## 2020-02-07 NOTE — Assessment & Plan Note (Addendum)
-   Treated for thrush multiple times - Changing ICS/LABA to [employer]

## 2020-02-07 NOTE — Patient Instructions (Addendum)
COPD: - Stop Symbicort - Start Stiolto two puffs once daily (samples given) - Start budesonide nebulizer twice daily (RX sent)  Follow-up: Televisit with NP in 2 weeks for new medication review

## 2020-02-21 ENCOUNTER — Other Ambulatory Visit: Payer: Self-pay

## 2020-02-21 ENCOUNTER — Encounter: Payer: Self-pay | Admitting: Primary Care

## 2020-02-21 ENCOUNTER — Ambulatory Visit (INDEPENDENT_AMBULATORY_CARE_PROVIDER_SITE_OTHER): Payer: Medicare Other | Admitting: Primary Care

## 2020-02-21 DIAGNOSIS — R058 Other specified cough: Secondary | ICD-10-CM

## 2020-02-21 DIAGNOSIS — R05 Cough: Secondary | ICD-10-CM | POA: Diagnosis not present

## 2020-02-21 DIAGNOSIS — J449 Chronic obstructive pulmonary disease, unspecified: Secondary | ICD-10-CM

## 2020-02-21 MED ORDER — STIOLTO RESPIMAT 2.5-2.5 MCG/ACT IN AERS: 2.0000 | INHALATION_SPRAY | Freq: Every day | RESPIRATORY_TRACT | 3 refills | Status: DC

## 2020-02-21 NOTE — Patient Instructions (Signed)
  COPD - Continue Stiolto two puffs once daily  - Continue budesonide nebulizer twice daily - Continue Albuterol inahler 2 puffs every 4-6 hours as needed for breakthrough sob/wheezing  - Monitor for worsening shortness of breath/wheezing or purulent mucus   Follow-up: 3 months with Dr. Melvyn Novas

## 2020-02-21 NOTE — Progress Notes (Signed)
Virtual Visit via Telephone Note  I connected with Tracy Gray on 02/21/20 at  9:30 AM EST by telephone and verified that I am speaking with the correct person using two identifiers.  Location: Patient: Home Provider: Office   I discussed the limitations, risks, security and privacy concerns of performing an evaluation and management service by telephone and the availability of in person appointments. I also discussed with the patient that there may be a patient responsible charge related to this service. The patient expressed understanding and agreed to proceed.   Brief patient profile:  66 yowf  MM   quit smoking 05/2018  "sick"  since September 2016 with ear pain, nasal congestion, cough and sob rx per Dr Janeice Robinson with multiple abx and prednisone but no better then to ER > still only  some better  referred to pulmonary clinic 02/02/2016 for copd eval by EDP  Proved to have GOLD III criteria 03/2016   History of Present Illness: 54 year old female, former smoker quit 2019. PMH significant for COPD GOLD III, OSA, chronic sinusitis, dysphagia, GERD, UACS. Patient of Dr. Melvyn Novas. Symbicort changed in March 2020 to LABA/LAMA d/t thrush. Maintained on Stiolto + budesonide neb BID.   02/21/2020 Patient contacted today for 2 week follow-up visit. During last visit patient felt her breathing could be better and that Symbicort was not working for her anymore. Reported that generic Symbicort was not covered by her insurance and that she has been treated for thrush multiple times. Changed to Stiolto + budesonide nebulizer.   Patient is feel much better, states that her mother has told her that she "sounds" better. She reports taking Stiolto as directed which has improved her breathing. She has not needed to use her rescue inhaler. Previously she was using her SAB upwards of 8 times a day. States that her cough has also significantly improved, she is not "choking" and not coughing at night where she is  keeping her family awake. She has had no further thursh symptoms and states that her mouth feels much better.   Observations/Objective:  - Appears well, no shortness of breath/cough/wheezing  Assessment and Plan:  COPD GOLD III - Cough and dyspnea significant improved since changing to LABA/LAMA - Rare SABA use  - Continue Stiolto two puffs once daily; Budesonide nebulizer twice daily - Continue Albuterol hfa 2 puffs every 4-6 hours as needed for breakthrough sob/wheezing  - Monitor for worsening shortness of breath/wheezing or purulent mucus   Follow Up Instructions:   - FU in 3 months with Dr. Melvyn Novas  I discussed the assessment and treatment plan with the patient. The patient was provided an opportunity to ask questions and all were answered. The patient agreed with the plan and demonstrated an understanding of the instructions.   The patient was advised to call back or seek an in-person evaluation if the symptoms worsen or if the condition fails to improve as anticipated.  I provided 15 minutes of non-face-to-face time during this encounter.   Martyn Ehrich, NP

## 2020-04-12 ENCOUNTER — Other Ambulatory Visit: Payer: Self-pay | Admitting: Primary Care

## 2020-05-11 ENCOUNTER — Other Ambulatory Visit: Payer: Self-pay | Admitting: Internal Medicine

## 2020-06-27 ENCOUNTER — Other Ambulatory Visit: Payer: Self-pay | Admitting: Primary Care

## 2020-08-10 ENCOUNTER — Ambulatory Visit: Payer: Medicare Other | Admitting: Internal Medicine

## 2020-08-11 ENCOUNTER — Ambulatory Visit: Payer: Medicare Other | Admitting: Internal Medicine

## 2020-09-15 ENCOUNTER — Other Ambulatory Visit: Payer: Self-pay | Admitting: Internal Medicine

## 2020-09-15 DIAGNOSIS — Z1231 Encounter for screening mammogram for malignant neoplasm of breast: Secondary | ICD-10-CM

## 2020-10-02 ENCOUNTER — Ambulatory Visit: Payer: Medicare Other | Admitting: Internal Medicine

## 2020-10-03 ENCOUNTER — Other Ambulatory Visit: Payer: Self-pay

## 2020-10-03 ENCOUNTER — Ambulatory Visit
Admission: RE | Admit: 2020-10-03 | Discharge: 2020-10-03 | Disposition: A | Discharge status: Home / Self Care | Payer: Medicare Other | Source: Ambulatory Visit | Attending: Internal Medicine | Admitting: Internal Medicine

## 2020-10-03 DIAGNOSIS — Z1231 Encounter for screening mammogram for malignant neoplasm of breast: Secondary | ICD-10-CM

## 2020-10-03 NOTE — Progress Notes (Signed)
@Patient  ID: Tracy Gray, female    DOB: Dec 25, 1965, 54 y.o.   MRN: [id number]  Chief Complaint  Patient presents with  . Follow-up    Pt states she has been doing okay since last visit. States on 09/21/20 she went to [facility] due to increased SOB and pt states she still has problems with SOB that is worse with exertion but does have SOB all the time. Pt also has complaints of cough.    Referring provider: Lorene Dy, MD  Brief patient profile:  41 yowf  MM   quit smoking 05/2018  "sick"  since September 2016 with ear pain, nasal congestion, cough and sob rx per Dr Janeice Robinson with multiple abx and prednisone but no better then to ER > still only  some better  referred to pulmonary clinic 02/02/2016 for copd eval by EDP  Proved to have GOLD III criteria 03/2016   History of Present Illness: 54 year old female, former smoker quit 2019. PMH significant for COPD GOLD III, OSA, chronic sinusitis, dysphagia, GERD, UACS. Patient of Dr. Melvyn Novas. Symbicort changed in March 2020 to LABA/LAMA d/t thrush. Maintained on Stiolto + budesonide neb BID.   Previous [facility] pulmonary encounter: 02/21/2020 Patient contacted today for 2 week follow-up visit. During last visit patient felt her breathing could be better and that Symbicort was not working for her anymore. Reported that generic Symbicort was not covered by her insurance and that she has been treated for thrush multiple times. Changed to Stiolto + budesonide nebulizer.   Patient is feel much better, states that her mother has told her that she "sounds" better. She reports taking Stiolto as directed which has improved her breathing. She has not needed to use her rescue inhaler. Previously she was using her SAB upwards of 8 times a day. States that her cough has also significantly improved, she is not "choking" and not coughing at night where she is keeping her family awake. She has had no further thursh symptoms and states that her mouth feels much better.    10/06/2020- Interim hx Patient presents today for 6 month follow-up. Patient reports increased shortness of breath and cough x 3 weeks. She went to [facility] on September 30th for URI symptoms,  treated with doxycyline and prednisone 20mg  x 5 days. Completed these with little improvement. Covid was negative. She continues to have sinus symptoms, dry hacking cough and chest tightness. No mucus production. Reports head congestion. She is compliant with Stiolto and budesonide twice daily as prescribed. Her home environment effects her allergy symptoms a great deal. States that she has a lot of dust in her home, she has cat/dog and her grandson smokes in his room. She is taking flonase nasal spray and Singulair. She bought a nasal irrigation machine yesterday. She has had to use her ventolin rescue inhaler 5 times this past week. She is working on getting more exercise, she recent got a treadmill.     Allergies  Allergen Reactions  . Bee Venom Anaphylaxis  . Penicillins Anaphylaxis  . Pantoprazole Sodium Diarrhea    Immunization History  Administered Date(s) Administered  . Fluad Quad(high Dose 65+) 10/06/2020  . Influenza Inj Mdck Quad Pf 10/07/2019  . Influenza Split 09/23/2015, 09/22/2017  . Influenza-Unspecified 05/19/2017  . Moderna SARS-COVID-2 Vaccination 04/21/2020, 05/19/2020  . Pneumococcal-Unspecified 09/23/2015  . Tdap 04/25/2013    Past Medical History:  Diagnosis Date  . Adenomatous colon polyp 03/26/2018   3 adenomas, max 17mm  . Allergy   .  Anxiety   . Asthma   . Chronic headache   . COPD (chronic obstructive pulmonary disease) ([place])   . Degenerative disc disease   . Depression   . Fibromyalgia   . GERD (gastroesophageal reflux disease)   . Hyperlipidemia   . Multiple sclerosis ([place])   . Nerve damage   . OSA (obstructive sleep apnea)    not on CPAP   . Sleep apnea     Tobacco History: Social History   Tobacco Use  Smoking Status Former Smoker  . Packs/day:  0.50  . Years: 42.00  . Pack years: 21.00  . Types: Cigarettes  . Quit date: 06/03/2018  . Years since quitting: 2.3  Smokeless Tobacco Never Used  Tobacco Comment       Counseling given: Not Answered Comment:     Outpatient Medications Prior to Visit  Medication Sig Dispense Refill  . albuterol (PROAIR HFA) 108 (90 Base) MCG/ACT inhaler Inhale 2 puffs into the lungs every 4 (four) hours as needed for wheezing or shortness of breath. PLAN B    . albuterol (PROVENTIL) (2.5 MG/3ML) 0.083% nebulizer solution Take 2.5 mg by nebulization every 4 (four) hours as needed for wheezing or shortness of breath. PLAN C    . baclofen (LIORESAL) 10 MG tablet Take 10 mg by mouth 3 (three) times daily as needed for muscle spasms.    . budesonide (PULMICORT) 0.25 MG/2ML nebulizer solution Take 2 mLs (0.25 mg total) by nebulization 2 (two) times daily. 60 mL 3  . clonazePAM (KLONOPIN) 1 MG tablet Take 1 mg by mouth 2 (two) times daily.    Marland Kitchen dexlansoprazole (DEXILANT) 60 MG capsule Take 1 capsule (60 mg total) by mouth daily. 90 capsule 3  . EPINEPHrine 0.3 mg/0.3 mL IJ SOAJ injection Inject 0.3 mg into the muscle as needed.     . famotidine (PEPCID) 40 MG tablet Take 1 tablet (40 mg total) by mouth at bedtime. 90 tablet 3  . fluticasone (FLONASE) 50 MCG/ACT nasal spray Place 2 sprays into both nostrils daily.    Marland Kitchen HYDROcodone-acetaminophen (NORCO) 7.5-325 MG tablet Take 1 tablet by mouth every 6 (six) hours as needed.     . mexiletine (MEXITIL) 150 MG capsule 1 cap daily, then increase to 2 daily after 1 week.    . montelukast (SINGULAIR) 10 MG tablet Take 10 mg by mouth at bedtime.    . Multiple Vitamins-Minerals (MULTI-VITAMIN GUMMIES PO)     . Respiratory Therapy Supplies (FLUTTER) DEVI Use as directed 1 each 0  . SALINE NASAL SPRAY NA     . simvastatin (ZOCOR) 20 MG tablet TK 1 T PO QD.  4  . STIOLTO RESPIMAT 2.5-2.5 MCG/ACT AERS INHALE 2 PUFFS INTO THE LUNGS DAILY 4 g 3  . cyclobenzaprine  (FLEXERIL) 10 MG tablet Take 10 mg by mouth every 12 (twelve) hours as needed for muscle spasms.    . famotidine (PEPCID) 20 MG tablet TAKE 2 TABLETS BY MOUTH EVERY NIGHT AT BEDTIME 484 tablet 0  . nicotine polacrilex (EQ NICOTINE) 4 MG lozenge nicotine lozenge?? 4mg      No facility-administered medications prior to visit.    Review of Systems  Review of Systems  Constitutional: Negative.   HENT: Positive for congestion and sinus pressure.   Respiratory: Negative for chest tightness and wheezing.        Dry cough, DOE  Cardiovascular: Negative.     Physical Exam  BP 110/64 (BP Location: Right Arm, Cuff Size: Normal)  Pulse 87   Temp 98.8 F (37.1 C) (Other (Comment)) Comment (Src): wrist  Ht 5\' 4"  (1.626 m)   Wt 156 lb 12.8 oz (71.1 kg)   SpO2 98%   BMI 26.91 kg/m  Physical Exam Constitutional:      Appearance: Normal appearance.  HENT:     Head: Normocephalic and atraumatic.  Cardiovascular:     Rate and Rhythm: Normal rate and regular rhythm.  Skin:    General: Skin is warm and dry.  Neurological:     General: No focal deficit present.     Mental Status: She is alert and oriented to person, place, and time. Mental status is at baseline.  Psychiatric:        Mood and Affect: Mood normal.        Behavior: Behavior normal.        Thought Content: Thought content normal.        Judgment: Judgment normal.      Lab Results:  CBC    Component Value Date/Time   WBC 7.0 03/01/2016 1103   RBC 4.91 03/01/2016 1103   HGB 15.0 03/01/2016 1103   HCT 44.4 03/01/2016 1103   PLT 252.0 03/01/2016 1103   MCV 90.3 03/01/2016 1103   MCH 30.8 12/18/2015 1733   MCHC 33.7 03/01/2016 1103   RDW 14.0 03/01/2016 1103   LYMPHSABS 2.4 03/01/2016 1103   MONOABS 0.8 03/01/2016 1103   EOSABS 0.1 03/01/2016 1103   BASOSABS 0.0 03/01/2016 1103    BMET    Component Value Date/Time   NA 142 03/01/2016 1103   K 4.3 03/01/2016 1103   CL 106 03/01/2016 1103   CO2 30 03/01/2016  1103   GLUCOSE 90 03/01/2016 1103   BUN 10 03/01/2016 1103   CREATININE 0.90 03/01/2016 1103   CALCIUM 9.6 03/01/2016 1103   GFRNONAA >60 12/18/2015 1733   GFRAA >60 12/18/2015 1733    BNP No results found for: BNP  ProBNP    Component Value Date/Time   PROBNP 45.0 03/01/2016 1103    Imaging: No results found.   Assessment & Plan:   Sinusitis, chronic - Seen [facility] 09/21/20 for URI, given Doxycycline and pred x 5 days with little improvement. No signs of bacterial infection, will extended prednisone taper for sinusitis symptoms and COPD exac.  - Continue Montelukast 10mg  at bedtime, Flonase nasal spray. Start over the counter antihistamine such as Claritin or Zyrte,  and use nasal irrigation twice daily. Recommend mucinex 600mg  twice daily x 5-7 days. Rx prednisone 40mg  x 2 days; 30mg  x 2 days; 20mg  x 2 days; 10mg  x 2 days   COPD  GOLD III with min reversiblity  - Occasional exacerbation triggered by sinusitis symptoms. Otherwise breathing is well controlled on current treatment. She is somewhat limited by dyspnea but plans to start exercising. Continue Stiolto 2 puffs daily and Pulmicort nebulizer twice daily. Up to date with covid vaccines. Received influenza vaccine today. FU in 6 months with Dr. Melvyn Novas or sooner if needed.      Martyn Ehrich, NP 10/06/2020

## 2020-10-06 ENCOUNTER — Ambulatory Visit (INDEPENDENT_AMBULATORY_CARE_PROVIDER_SITE_OTHER): Payer: Medicare Other | Admitting: Primary Care

## 2020-10-06 ENCOUNTER — Encounter: Payer: Self-pay | Admitting: Primary Care

## 2020-10-06 ENCOUNTER — Other Ambulatory Visit: Payer: Self-pay

## 2020-10-06 VITALS — BP 110/64 | HR 87 | Temp 98.8°F | Ht 64.0 in | Wt 156.8 lb

## 2020-10-06 DIAGNOSIS — J329 Chronic sinusitis, unspecified: Secondary | ICD-10-CM | POA: Diagnosis not present

## 2020-10-06 DIAGNOSIS — Z23 Encounter for immunization: Secondary | ICD-10-CM | POA: Diagnosis not present

## 2020-10-06 DIAGNOSIS — J449 Chronic obstructive pulmonary disease, unspecified: Secondary | ICD-10-CM | POA: Diagnosis not present

## 2020-10-06 MED ORDER — PREDNISONE 10 MG PO TABS: ORAL_TABLET | ORAL | 0 refills | Status: DC

## 2020-10-06 NOTE — Assessment & Plan Note (Addendum)
-   Seen UC 09/21/20 for URI, given Doxycycline and pred x 5 days with little improvement. No signs of bacterial infection, will extended prednisone taper for sinusitis symptoms and COPD exac.  - Continue Montelukast 10mg  at bedtime, Flonase nasal spray. Start over the counter antihistamine such as Claritin or Zyrte,  and use nasal irrigation twice daily. Recommend mucinex 600mg  twice daily x 5-7 days. Rx prednisone 40mg  x 2 days; 30mg  x 2 days; 20mg  x 2 days; 10mg  x 2 days

## 2020-10-06 NOTE — Patient Instructions (Addendum)
Allergic rhinitis:  - Start over the counter antihistamine (either Zyrtec/clariting or Xyzal) - Continue Flonase nasal spray  - Use saline nasal spray twice daily - Take mucinex 600mg  twice daily x 5-7 days   COPD: - Continue Stiolto 2 puffs daily - Continue Pulmicort nebulizer twice daily  Rx: - Prednisone taper as directed  - high dose Influenza vaccine today   Follow-up: - 6 months with Dr. Melvyn Novas or sooner if needed       Allergies, Adult An allergy is when your body's defense system (immune system) overreacts to an otherwise harmless substance (allergen) that you breathe in or eat or something that touches your skin. When you come into contact with something that you are allergic to, your immune system produces certain proteins (antibodies). These proteins cause cells to release chemicals (histamines) that trigger the symptoms of an allergic reaction. Allergies often affect the nasal passages (allergic rhinitis), eyes (allergic conjunctivitis), skin (atopic dermatitis), and stomach. Allergies can be mild or severe. Allergies cannot spread from person to person (are not contagious). They can develop at any age and may be outgrown. What increases the risk? You may be at greater risk of allergies if other people in your family have allergies. What are the signs or symptoms? Symptoms depend on what type of allergy you have. They may include:  Runny, stuffy nose.  Sneezing.  Itchy mouth, ears, or throat.  Postnasal drip.  Sore throat.  Itchy, red, watery, or puffy eyes.  Skin rash or hives.  Stomach pain.  Vomiting.  Diarrhea.  Bloating.  Wheezing or coughing. People with a severe allergy to food, medicine, or an insect bite may have a life-threatening allergic reaction (anaphylaxis). Symptoms of anaphylaxis include:  Hives.  Itching.  Flushed face.  Swollen lips, tongue, or mouth.  Tight or swollen throat.  Chest pain or tightness in the  chest.  Trouble breathing or shortness of breath.  Rapid heartbeat.  Dizziness or fainting.  Vomiting.  Diarrhea.  Pain in the abdomen. How is this diagnosed? This condition is diagnosed based on:  Your symptoms.  Your family and medical history.  A physical exam. You may need to see a health care provider who specializes in treating allergies (allergist). You may also have tests, including:  Skin tests to see which allergens are causing your symptoms, such as: ? Skin prick test. In this test, your skin is pricked with a tiny needle and exposed to small amounts of possible allergens to see if your skin reacts. ? Intradermal skin test. In this test, a small amount of allergen is injected under your skin to see if your skin reacts. ? Patch test. In this test, a small amount of allergen is placed on your skin and then your skin is covered with a bandage. Your health care provider will check your skin after a couple of days to see if a rash has developed.  Blood tests.  Challenges tests. In this test, you inhale a small amount of allergen by mouth to see if you have an allergic reaction. You may also be asked to:  Keep a food diary. A food diary is a record of all the foods and drinks you have in a day and any symptoms you experience.  Practice an elimination diet. An elimination diet involves eliminating specific foods from your diet and then adding them back in one by one to find out if a certain food causes an allergic reaction. How is this treated? Treatment for allergies  depends on your symptoms. Treatment may include:  Cold compresses to soothe itching and swelling.  Eye drops.  Nasal sprays.  Using a saline spray or container (neti pot) to flush out the nose (nasal irrigation). These methods can help clear away mucus and keep the nasal passages moist.  Using a humidifier.  Oral antihistamines or other medicines to block allergic reaction and inflammation.  Skin  creams to treat rashes or itching.  Diet changes to eliminate food allergy triggers.  Repeated exposure to tiny amounts of allergens to build up a tolerance and prevent future allergic reactions (immunotherapy). These include: ? Allergy shots. ? Oral treatment. This involves taking small doses of an allergen under the tongue (sublingual immunotherapy).  Emergency epinephrine injection (auto-injector) in case of an allergic emergency. This is a self-injectable, pre-measured medicine that must be given within the first few minutes of a serious allergic reaction. Follow these instructions at home:         Avoid known allergens whenever possible.  If you suffer from airborne allergens, wash out your nose daily. You can do this with a saline spray or a neti pot to flush out your nose (nasal irrigation).  Take over-the-counter and prescription medicines only as told by your health care provider.  Keep all follow-up visits as told by your health care provider. This is important.  If you are at risk of a severe allergic reaction (anaphylaxis), keep your auto-injector with you at all times.  If you have ever had anaphylaxis, wear a medical alert bracelet or necklace that states you have a severe allergy. Contact a health care provider if:  Your symptoms do not improve with treatment. Get help right away if:  You have symptoms of anaphylaxis, such as: ? Swollen mouth, tongue, or throat. ? Pain or tightness in your chest. ? Trouble breathing or shortness of breath. ? Dizziness or fainting. ? Severe abdominal pain, vomiting, or diarrhea. This information is not intended to replace advice given to you by your health care provider. Make sure you discuss any questions you have with your health care provider. Document Revised: 03/04/2018 Document Reviewed: 06/26/2016 Elsevier Patient Education  [place].

## 2020-10-06 NOTE — Assessment & Plan Note (Addendum)
-   Occasional exacerbation triggered by sinusitis symptoms. Otherwise breathing is well controlled on current treatment. She is somewhat limited by dyspnea but plans to start exercising. Continue Stiolto 2 puffs daily and Pulmicort nebulizer twice daily. Up to date with covid vaccines. Received influenza vaccine today. FU in 6 months with Dr. Melvyn Novas or sooner if needed.

## 2020-10-09 ENCOUNTER — Telehealth: Payer: Self-pay | Admitting: Primary Care

## 2020-10-09 MED ORDER — PREDNISONE 10 MG PO TABS: ORAL_TABLET | ORAL | 0 refills | Status: DC

## 2020-10-09 NOTE — Telephone Encounter (Signed)
Spoke with the pt  Our electronic prescribing not working 10/06/20 when her pred was sent  I have resent the rx  Nothing further needed

## 2020-11-07 ENCOUNTER — Telehealth: Payer: Self-pay | Admitting: Internal Medicine

## 2020-11-07 ENCOUNTER — Other Ambulatory Visit: Payer: Self-pay | Admitting: Internal Medicine

## 2020-11-07 ENCOUNTER — Other Ambulatory Visit: Payer: Self-pay | Admitting: Primary Care

## 2020-11-07 MED ORDER — STIOLTO RESPIMAT 2.5-2.5 MCG/ACT IN AERS: 2.0000 | INHALATION_SPRAY | Freq: Every day | RESPIRATORY_TRACT | 5 refills | Status: DC

## 2020-11-07 MED ORDER — BUDESONIDE 0.25 MG/2ML IN SUSP: 0.2500 mg | Freq: Two times a day (BID) | RESPIRATORY_TRACT | 3 refills | Status: DC

## 2020-11-07 NOTE — Telephone Encounter (Signed)
Refills have been sent in to the pharmacy and the pt is aware.

## 2021-01-02 DIAGNOSIS — M26623 Arthralgia of bilateral temporomandibular joint: Secondary | ICD-10-CM | POA: Insufficient documentation

## 2021-03-07 ENCOUNTER — Ambulatory Visit: Payer: Medicare Other | Admitting: Gastroenterology

## 2021-03-19 DIAGNOSIS — M2669 Other specified disorders of temporomandibular joint: Secondary | ICD-10-CM | POA: Insufficient documentation

## 2021-04-02 ENCOUNTER — Ambulatory Visit: Payer: Medicare Other | Admitting: Internal Medicine

## 2021-04-03 DIAGNOSIS — H6992 Unspecified Eustachian tube disorder, left ear: Secondary | ICD-10-CM | POA: Insufficient documentation

## 2021-04-06 ENCOUNTER — Ambulatory Visit: Payer: Medicare Other | Admitting: Internal Medicine

## 2021-04-17 ENCOUNTER — Ambulatory Visit (INDEPENDENT_AMBULATORY_CARE_PROVIDER_SITE_OTHER): Payer: Medicare Other | Admitting: Internal Medicine

## 2021-04-17 ENCOUNTER — Other Ambulatory Visit: Payer: Self-pay

## 2021-04-17 ENCOUNTER — Encounter: Payer: Self-pay | Admitting: Internal Medicine

## 2021-04-17 DIAGNOSIS — R058 Other specified cough: Secondary | ICD-10-CM | POA: Diagnosis not present

## 2021-04-17 DIAGNOSIS — J449 Chronic obstructive pulmonary disease, unspecified: Secondary | ICD-10-CM

## 2021-04-17 NOTE — Patient Instructions (Addendum)
No change in medications   Please schedule a televist with Eric Form NP to do shared decision making regarding screening CT scan program   Please schedule a follow up visit in 12  months but call sooner if needed

## 2021-04-17 NOTE — Progress Notes (Signed)
Subjective:   Patient ID: Tracy Gray, female    DOB: [birth year]     MRN: [id number]  Brief patient profile:  70 yowf  MM  quit smoking 05/2018  "sick"  since September 2016 with ear pain, nasal congestion, cough and sob rx per Dr Janeice Robinson with multiple abx and prednisone but no better then to ER > still only  some better  referred to pulmonary clinic 02/02/2016 for copd eval by EDP  Proved to have GOLD III criteria 03/2016     History of Present Illness  02/02/2016 1st [facility] office visit/ Meridith Romick   Chief Complaint  Patient presents with  . Pulmonary Consult    Referred by [facility] after ED visit on 12/17/16. Pt states that she was dxed with COPD "years ago"- c/o increased SOB and cough for the past few months.  Cough is prod with clear to green sputum. She is SOB with or without any exertion. She is using albuterol 4 x per day on average.   symptoms came on abruptly p "virus" in Sept 2016  and consistent of variable sob are worse after supper assoc with choking sensation / sore throat and overt HB and persistent Doe x car to house with groceries every time. Cough is worse in am's, minimally productive, only occ green now. rec   sinus ct > pos sinusitis > levaquin x 10   Pantoprazole (protonix) 40 mg   Take  30-60 min before first meal of the day and Zantac  @  bedtime until return to office -  GERD  Add: did not go to lab/ ok to complete when returns       04/15/2016  f/u ov/Enrique Manganaro re: GOLD III/ still smoking/ changed to  advair 115 due  To insurance  Chief Complaint  Patient presents with  . Follow-up    PFT done 04/11/16. She c/o "feels like I don't have any air" x 3-4 days. She states that she also has "butterfly in my chest".  She is using albuterol inhaler 3 x daily on average and neb with atrovent  3 x per wk on average.   now on chantix / duoneb rarely and planning to regroup Dr  Wilburn Cornelia 4/25 Feels worse off symbicort and on advair  rec Plan A = Automatic =  Symbicort 160 Take 2 puffs first thing in am and then another 2 puffs about 12 hours later.  Plan B = Backup Only use your albuterol as a rescue medication  Plan C = Crisis - only use your albuterol/ipatropium  nebulizer if you first try Plan B and it fails to help > ok to use the nebulizer up to every 4 hours but if start needing it regularly call for immediate appointment The key is to stop smoking completely before smoking completely stops you - it's the most important aspect of your care     08/03/2018  f/u ov/Randy Castrejon re:  GOLD III/ no smoking since 05/2018 / maint on  Chief Complaint  Patient presents with  . Follow-up    Pt states she has had some episodes where she has felt like she has been choking while swallowing. Pt is going to have a barium swallow to see if anything is shown with that. Pt is two and a half months smoking free.  Dyspnea:  MMRC2 = can't walk a nl pace on a flat grade s sob but does fine slow and flat eg 30 min at mall  Cough: just  assoc with eating"like choking" w/u in progress  Sleeping: flat ok but restless SABA use: when over does it  rec Work on hfa    02/03/2019  f/u ov/Avishai Reihl re: copd gold III / still not smoking  / lingering wheeze/ cough p uri maint on symb 80 2bid  Chief Complaint  Patient presents with  . Follow-up    Pt c/o cough with clear sputum and wheezing since had fluy in Dec 2019. She is using her proair 3-4 x per day on average and neb about 2 x per day.  Dyspnea:  Able to do [employer] walking slow pace = MMRC2 = can't walk a nl pace on a flat grade s sob but does fine slow and flat  Cough: with ex mostly but also some in am and not using mucinex or flutter as rec  Sleeping: on side at 10 degrees with bed blocks  SABA use: too much as noted mostly when "over does it" - never rechallenges p saba to see if ex tol improves 02: no  rec Prednisone 10 mg take  4 each am x 2 days,   2 each am x 2 days,  1 each am x 2 days and stop  For cough > mucinex  dm up to 1200 mg every 12 hours and use the flutter valve as much as possible Plan A = Automatic =  symbicort 160 Take 2 puffs first thing in am and then another 2 puffs about 12 hours later.  Work on inhaler technique:  Plan B = Backup Only use your albuterol as a rescue medication  Plan C = Crisis - only use your albuterol nebulizer if you first try Plan B and it fails to help > ok to use the nebulizer up to every 4 hours but if start needing it regularly call for immediate appointment     07/21/2019  f/u ov/Tyson Masin re:  GOLD II/ symb 80 with suboptimal technique Chief Complaint  Patient presents with  . Follow-up    Breathing is overall doing well. She is having some sinus congestion and cough in the am with clear sputum. She is using her albuterol inhaler about 2 x per day on average and neb a few times per month.   Dyspnea:  [employer] ok/ problem is waking out in heat  Cough: minimal mucoid, mostly in am  Sleeping: on side / bed blocks effective  SABA use: as above, takes first thing in am  02: none  Still overt hb despite ppi and h2hs rec  Plan A = Automatic =  symbicort 80 Take 2 puffs first thing in am and then another 2 puffs about 12 hours later.  Work on inhaler technique:   Plan B = Backup Only use your albuterol as a rescue medication Plan C = Crisis - only use your albuterol nebulizer if you first try Plan B and it fails to help > ok to use the nebulizer up to every 4 hours but if start needing it regularly call for immediate appointment    04/17/2021  f/u ov/Olga Bourbeau re:  GOLD II/ symb maint stiolto 2x each am and bud bid neb  Chief Complaint  Patient presents with  . Follow-up    Sob-same on exertion, pnd, cough occass.,sinus infection 2 mths.being treated  Dyspnea:  MMRC1 = can walk nl pace, flat grade, can't hurry or go uphills or steps s sob   Cough: some with pnds rx clindamycin Sleeping: bed blocks/ one pillow  SABA use:  increase albuterol to 6 x week y with sinus  flare 02: none  Covid status:   vax x 3 moderna   No obvious day to day or daytime variability or assoc excess/ purulent sputum or mucus plugs or hemoptysis or cp or chest tightness, subjective wheeze or overt   hb symptoms.   Sleeping ok without nocturnal  or early am exacerbation  of respiratory  c/o's or need for noct saba. Also denies any obvious fluctuation of symptoms with weather or environmental changes or other aggravating or alleviating factors except as outlined above   No unusual exposure hx or h/o childhood pna/ asthma or knowledge of premature birth.  Current Allergies, Complete Past Medical History, Past Surgical History, Family History, and Social History were reviewed in [employer] record.  ROS  The following are not active complaints unless bolded Hoarseness, sore throat, dysphagia, dental problems, itching, sneezing,  nasal congestion or discharge of excess mucus or purulent secretions, ear ache,   fever, chills, sweats, unintended wt loss or wt gain, classically pleuritic or exertional cp,  orthopnea pnd or arm/hand swelling  or leg swelling, presyncope, palpitations, abdominal pain, anorexia, nausea, vomiting, diarrhea  or change in bowel habits or change in bladder habits, change in stools or change in urine, dysuria, hematuria,  rash, arthralgias, visual complaints, headache, numbness, weakness or ataxia or problems with walking or coordination,  change in mood or  memory.        Current Meds  Medication Sig  . albuterol (PROVENTIL) (2.5 MG/3ML) 0.083% nebulizer solution Take 2.5 mg by nebulization every 4 (four) hours as needed for wheezing or shortness of breath. PLAN C  . albuterol (VENTOLIN HFA) 108 (90 Base) MCG/ACT inhaler Inhale 2 puffs into the lungs every 4 (four) hours as needed for wheezing or shortness of breath. PLAN B  . budesonide (PULMICORT) 0.25 MG/2ML nebulizer solution Take 2 mLs (0.25 mg total) by nebulization 2 (two) times  daily.  . clonazePAM (KLONOPIN) 1 MG tablet Take 1 mg by mouth 2 (two) times daily.  Marland Kitchen EPINEPHrine 0.3 mg/0.3 mL IJ SOAJ injection Inject 0.3 mg into the muscle as needed.   . famotidine (PEPCID) 40 MG tablet Take 1 tablet (40 mg total) by mouth at bedtime.  . fluticasone (FLONASE) 50 MCG/ACT nasal spray Place 2 sprays into both nostrils daily.  Marland Kitchen HYDROcodone-acetaminophen (NORCO) 7.5-325 MG tablet Take 1 tablet by mouth every 6 (six) hours as needed.   . montelukast (SINGULAIR) 10 MG tablet Take 10 mg by mouth at bedtime.  . Multiple Vitamins-Minerals (MULTI-VITAMIN GUMMIES PO)   . Respiratory Therapy Supplies (FLUTTER) DEVI Use as directed  . SALINE NASAL SPRAY NA   . Tiotropium Bromide-Olodaterol (STIOLTO RESPIMAT) 2.5-2.5 MCG/ACT AERS Inhale 2 puffs into the lungs daily.              Objective:   Physical Exam     04/17/2021        151  07/21/2019        160  02/03/2019        163  08/03/2018        163  05/01/2018       167  03/20/2018        168  11/05/2017     167  07/15/2016       152   04/15/2016      159   03/01/16 157 lb (71.215 kg)  02/02/16 157 lb (71.215 kg)  10/09/12 142 lb (64.411  kg)     Vital signs reviewed  04/17/2021  - Note at rest 02 sats  100% on RA   General appearance:    amb wf nad     HEENT : pt wearing mask not removed for exam due to covid - 19 concerns.    NECK :  without JVD/Nodes/TM/ nl carotid upstrokes bilaterally   LUNGS: no acc muscle use,  Mild barrel  contour chest wall with bilateral  Distant bs s audible wheeze and  without cough on insp or exp maneuvers  and mild  Hyperresonant  to  percussion bilaterally     CV:  RRR  no s3 or murmur or increase in P2, and no edema   ABD:  soft and nontender with pos end  insp Hoover's  in the supine position. No bruits or organomegaly appreciated, bowel sounds nl  MS:   Nl gait/  ext warm without deformities, calf tenderness, cyanosis or clubbing No obvious joint restrictions   SKIN: warm and  dry without lesions    NEURO:  alert, approp, nl sensorium with  no motor or cerebellar deficits apparent.              .       Assessment & Plan:

## 2021-04-17 NOTE — Assessment & Plan Note (Addendum)
Quit smoking  Sept 2016  - PFT's  04/11/2016  FEV1 1.32 (47 % ) ratio 60  p 13 % improvement from saba p advair prior to study with DLCO  42/42 % corrects to 66 % for alv volume   - 11/05/2017   change advair to symbicort 160 2bid  - 03/20/2018   try symb 80 2bid due to prominent cough with choking (see uacs) PFT's  05/01/2018  FEV1 1.12 (41 % ) ratio 58  p 0 % improvement from saba p nothing prior to study with DLCO  42 % corrects to 64  % for alv volume    - alpha one AT screen 05/01/2018 ;  MM level 171   -  07/21/2019  After extensive coaching inhaler device,  effectiveness =  90% from baseline 75% > continue symb 80 2bid as higher doses tend to irritate her upper airway.  Doing much better since she stopped smoking completely one year prior to OV  .  I strongly reinforced this message.   02/07/2020 - Trial changing Symbicort to Stiolto + budesonide nebulizer Insurance not covering Sym and patient does not feel it is working and reports thrush.   02/21/2020 Patient reports 100% improvement. Continue Stiolto and Budesonide nebulizer BID    Group D in terms of symptom/risk and laba/lama/ICS  therefore appropriate rx at this point >>>  stiolto /budesonide rx optimal > f/u yearly   In meantime: Low-dose CT lung cancer screening is recommended for patients who are 37-11 years of age with a 30+ pack-year history of smoking, and who are currently smoking or quit <=15 years ago.    >>> she is now eligible so referred for shared decision making           Each maintenance medication was reviewed in detail including emphasizing most importantly the difference between maintenance and prns and under what circumstances the prns are to be triggered using an action plan format where appropriate.  Total time for H and P, chart review, counseling, reviewing smi/hfa/neb device(s) and generating customized AVS unique to this office visit / same day charting > 30 min

## 2021-04-17 NOTE — Assessment & Plan Note (Signed)
Sinus CT 02/06/2016 > Maxillary sinusitis> rx levaquin 500  daily x 10 days> min improvement 03/01/2016 > referred to ENT > [facility] Allergy profile 03/01/2016 >  Eos 0.1 /  IgE  8 neg RAST  F/u ENT/ Tracy Gray

## 2021-06-15 ENCOUNTER — Other Ambulatory Visit: Payer: Self-pay | Admitting: Internal Medicine

## 2021-07-04 ENCOUNTER — Other Ambulatory Visit: Payer: Self-pay

## 2021-07-04 ENCOUNTER — Encounter (HOSPITAL_BASED_OUTPATIENT_CLINIC_OR_DEPARTMENT_OTHER): Payer: Self-pay | Admitting: Otolaryngology

## 2021-07-05 ENCOUNTER — Other Ambulatory Visit: Payer: Self-pay | Admitting: Otolaryngology

## 2021-07-09 ENCOUNTER — Encounter (HOSPITAL_BASED_OUTPATIENT_CLINIC_OR_DEPARTMENT_OTHER)
Admission: RE | Admit: 2021-07-09 | Discharge: 2021-07-09 | Disposition: A | Discharge status: Home / Self Care | Payer: Medicare Other | Source: Ambulatory Visit | Attending: Otolaryngology | Admitting: Otolaryngology

## 2021-07-09 DIAGNOSIS — Z01812 Encounter for preprocedural laboratory examination: Secondary | ICD-10-CM | POA: Insufficient documentation

## 2021-07-11 ENCOUNTER — Encounter (HOSPITAL_BASED_OUTPATIENT_CLINIC_OR_DEPARTMENT_OTHER)
Admission: RE | Disposition: A | Discharge status: Home / Self Care | Payer: Self-pay | Source: Home / Self Care | Attending: Otolaryngology

## 2021-07-11 ENCOUNTER — Ambulatory Visit (HOSPITAL_BASED_OUTPATIENT_CLINIC_OR_DEPARTMENT_OTHER)
Admission: RE | Admit: 2021-07-11 | Discharge: 2021-07-11 | Disposition: A | Discharge status: Home / Self Care | Payer: Medicare Other | Attending: Otolaryngology | Admitting: Otolaryngology

## 2021-07-11 ENCOUNTER — Other Ambulatory Visit: Payer: Self-pay

## 2021-07-11 ENCOUNTER — Ambulatory Visit (HOSPITAL_BASED_OUTPATIENT_CLINIC_OR_DEPARTMENT_OTHER): Payer: Medicare Other | Admitting: Certified Registered"

## 2021-07-11 ENCOUNTER — Encounter (HOSPITAL_BASED_OUTPATIENT_CLINIC_OR_DEPARTMENT_OTHER): Payer: Self-pay | Admitting: Otolaryngology

## 2021-07-11 DIAGNOSIS — Z79899 Other long term (current) drug therapy: Secondary | ICD-10-CM | POA: Insufficient documentation

## 2021-07-11 DIAGNOSIS — Z7951 Long term (current) use of inhaled steroids: Secondary | ICD-10-CM | POA: Insufficient documentation

## 2021-07-11 DIAGNOSIS — Z888 Allergy status to other drugs, medicaments and biological substances status: Secondary | ICD-10-CM | POA: Insufficient documentation

## 2021-07-11 DIAGNOSIS — J329 Chronic sinusitis, unspecified: Secondary | ICD-10-CM | POA: Diagnosis present

## 2021-07-11 DIAGNOSIS — Z91030 Bee allergy status: Secondary | ICD-10-CM | POA: Insufficient documentation

## 2021-07-11 DIAGNOSIS — Z880 Allergy status to penicillin: Secondary | ICD-10-CM | POA: Diagnosis not present

## 2021-07-11 DIAGNOSIS — Z87891 Personal history of nicotine dependence: Secondary | ICD-10-CM | POA: Insufficient documentation

## 2021-07-11 DIAGNOSIS — J343 Hypertrophy of nasal turbinates: Secondary | ICD-10-CM | POA: Insufficient documentation

## 2021-07-11 DIAGNOSIS — Z87892 Personal history of anaphylaxis: Secondary | ICD-10-CM | POA: Diagnosis not present

## 2021-07-11 HISTORY — PX: TURBINATE REDUCTION: SHX6157

## 2021-07-11 HISTORY — PX: SINUS ENDO W/FUSION: SHX777

## 2021-07-11 HISTORY — DX: Essential (primary) hypertension: I10

## 2021-07-11 SURGERY — SINUS SURGERY, ENDOSCOPIC, USING COMPUTER-ASSISTED NAVIGATION
Anesthesia: General | Site: Nose | Laterality: Bilateral

## 2021-07-11 MED ORDER — TRIAMCINOLONE ACETONIDE 40 MG/ML IJ SUSP
INTRAMUSCULAR | Status: AC
  Filled 2021-07-11: qty 5

## 2021-07-11 MED ORDER — FENTANYL CITRATE (PF) 100 MCG/2ML IJ SOLN
INTRAMUSCULAR | Status: DC | PRN
  Administered 2021-07-11: 50 ug via INTRAVENOUS

## 2021-07-11 MED ORDER — LIDOCAINE HCL (CARDIAC) PF 100 MG/5ML IV SOSY
PREFILLED_SYRINGE | INTRAVENOUS | Status: DC | PRN
  Administered 2021-07-11: 40 mg via INTRAVENOUS

## 2021-07-11 MED ORDER — OXYMETAZOLINE HCL 0.05 % NA SOLN
NASAL | Status: DC | PRN
  Administered 2021-07-11: 1 via TOPICAL

## 2021-07-11 MED ORDER — SUGAMMADEX SODIUM 200 MG/2ML IV SOLN
INTRAVENOUS | Status: DC | PRN
  Administered 2021-07-11: 200 mg via INTRAVENOUS

## 2021-07-11 MED ORDER — FENTANYL CITRATE (PF) 100 MCG/2ML IJ SOLN
INTRAMUSCULAR | Status: AC
  Filled 2021-07-11: qty 2

## 2021-07-11 MED ORDER — CHLORHEXIDINE GLUCONATE CLOTH 2 % EX PADS: 6.0000 | MEDICATED_PAD | Freq: Once | CUTANEOUS | Status: DC

## 2021-07-11 MED ORDER — FENTANYL CITRATE (PF) 100 MCG/2ML IJ SOLN
25.0000 ug | INTRAMUSCULAR | Status: DC | PRN
  Administered 2021-07-11: 25 ug via INTRAVENOUS

## 2021-07-11 MED ORDER — PROPOFOL 10 MG/ML IV BOLUS
INTRAVENOUS | Status: DC | PRN
Start: 2021-07-11 — End: 2021-07-11
  Administered 2021-07-11: 150 mg via INTRAVENOUS

## 2021-07-11 MED ORDER — OXYCODONE HCL 5 MG PO TABS: 5.0000 mg | ORAL_TABLET | Freq: Once | ORAL | Status: DC | PRN

## 2021-07-11 MED ORDER — OXYCODONE HCL 5 MG/5ML PO SOLN: 5.0000 mg | Freq: Once | ORAL | Status: DC | PRN

## 2021-07-11 MED ORDER — ACETAMINOPHEN 325 MG PO TABS: 325.0000 mg | ORAL_TABLET | ORAL | Status: DC | PRN

## 2021-07-11 MED ORDER — ACETAMINOPHEN 500 MG PO TABS
ORAL_TABLET | ORAL | Status: AC
  Filled 2021-07-11: qty 2

## 2021-07-11 MED ORDER — OXYMETAZOLINE HCL 0.05 % NA SOLN
NASAL | Status: AC
  Filled 2021-07-11: qty 60

## 2021-07-11 MED ORDER — MUPIROCIN 2 % EX OINT
TOPICAL_OINTMENT | CUTANEOUS | Status: AC
  Filled 2021-07-11: qty 22

## 2021-07-11 MED ORDER — MIDAZOLAM HCL 5 MG/5ML IJ SOLN
INTRAMUSCULAR | Status: DC | PRN
  Administered 2021-07-11: 1 mg via INTRAVENOUS

## 2021-07-11 MED ORDER — ACETAMINOPHEN 500 MG PO TABS
1000.0000 mg | ORAL_TABLET | Freq: Once | ORAL | Status: AC
  Administered 2021-07-11: 1000 mg via ORAL

## 2021-07-11 MED ORDER — AMISULPRIDE (ANTIEMETIC) 5 MG/2ML IV SOLN: 10.0000 mg | Freq: Once | INTRAVENOUS | Status: DC | PRN

## 2021-07-11 MED ORDER — DEXAMETHASONE SODIUM PHOSPHATE 4 MG/ML IJ SOLN
INTRAMUSCULAR | Status: DC | PRN
  Administered 2021-07-11: 5 mg via INTRAVENOUS

## 2021-07-11 MED ORDER — LEVOFLOXACIN 500 MG PO TABS: 500.0000 mg | ORAL_TABLET | Freq: Every day | ORAL | 0 refills | Status: DC

## 2021-07-11 MED ORDER — ACETAMINOPHEN 160 MG/5ML PO SOLN: 325.0000 mg | ORAL | Status: DC | PRN

## 2021-07-11 MED ORDER — LIDOCAINE-EPINEPHRINE 1 %-1:100000 IJ SOLN
INTRAMUSCULAR | Status: DC | PRN
  Administered 2021-07-11: 5 mL

## 2021-07-11 MED ORDER — MIDAZOLAM HCL 2 MG/2ML IJ SOLN
INTRAMUSCULAR | Status: AC
  Filled 2021-07-11: qty 2

## 2021-07-11 MED ORDER — CIPROFLOXACIN IN D5W 400 MG/200ML IV SOLN
INTRAVENOUS | Status: AC
  Filled 2021-07-11: qty 200

## 2021-07-11 MED ORDER — ONDANSETRON HCL 4 MG/2ML IJ SOLN
INTRAMUSCULAR | Status: DC | PRN
  Administered 2021-07-11: 4 mg via INTRAVENOUS

## 2021-07-11 MED ORDER — ROCURONIUM BROMIDE 100 MG/10ML IV SOLN
INTRAVENOUS | Status: DC | PRN
  Administered 2021-07-11: 60 mg via INTRAVENOUS

## 2021-07-11 MED ORDER — LIDOCAINE-EPINEPHRINE 1 %-1:100000 IJ SOLN
INTRAMUSCULAR | Status: AC
  Filled 2021-07-11: qty 2

## 2021-07-11 MED ORDER — LACTATED RINGERS IV SOLN: INTRAVENOUS | Status: DC

## 2021-07-11 MED ORDER — MUPIROCIN 2 % EX OINT
TOPICAL_OINTMENT | CUTANEOUS | Status: DC | PRN
  Administered 2021-07-11: 1 via NASAL

## 2021-07-11 MED ORDER — CIPROFLOXACIN IN D5W 400 MG/200ML IV SOLN
400.0000 mg | INTRAVENOUS | Status: AC
  Administered 2021-07-11: 400 mg via INTRAVENOUS

## 2021-07-11 SURGICAL SUPPLY — 53 items
BLADE RAD40 ROTATE 4M 4 5PK (BLADE) IMPLANT
BLADE RAD60 ROTATE M4 4 5PK (BLADE) IMPLANT
BLADE ROTATE RAD 12 4 M4 (BLADE) IMPLANT
BLADE ROTATE RAD 40 4 M4 (BLADE) IMPLANT
BLADE ROTATE TRICUT 4X13 M4 (BLADE) ×2 IMPLANT
BLADE TRICUT ROTATE M4 4 5PK (BLADE) IMPLANT
BUR HS RAD FRONTAL 3 (BURR) IMPLANT
BUR TAPER CHOANAL ATRESIA 30K (BURR) IMPLANT
CANISTER SUC SOCK COL 7IN (MISCELLANEOUS) ×2 IMPLANT
CANISTER SUCT 1200ML W/VALVE (MISCELLANEOUS) ×4 IMPLANT
COAGULATOR SUCT 8FR VV (MISCELLANEOUS) IMPLANT
COAGULATOR SUCT SWTCH 10FR 6 (ELECTROSURGICAL) IMPLANT
DECANTER SPIKE VIAL GLASS SM (MISCELLANEOUS) IMPLANT
DRESSING NASAL KENNEDY 3.5X.9 (MISCELLANEOUS) IMPLANT
DRSG NASAL KENNEDY 3.5X.9 (MISCELLANEOUS)
DRSG NASOPORE 8CM (GAUZE/BANDAGES/DRESSINGS) ×2 IMPLANT
DRSG TELFA 3X8 NADH (GAUZE/BANDAGES/DRESSINGS) IMPLANT
ELECT COATED BLADE 2.86 ST (ELECTRODE) IMPLANT
ELECT REM PT RETURN 9FT ADLT (ELECTROSURGICAL) ×2
ELECTRODE REM PT RTRN 9FT ADLT (ELECTROSURGICAL) ×1 IMPLANT
GLOVE SURG ENC TEXT LTX SZ7 (GLOVE) ×4 IMPLANT
GLOVE SURG POLYISO LF SZ7 (GLOVE) ×2 IMPLANT
GLOVE SURG UNDER POLY LF SZ7 (GLOVE) ×2 IMPLANT
GOWN STRL REUS W/ TWL LRG LVL3 (GOWN DISPOSABLE) ×2 IMPLANT
GOWN STRL REUS W/TWL LRG LVL3 (GOWN DISPOSABLE) ×4
IV NS 1000ML (IV SOLUTION)
IV NS 1000ML BAXH (IV SOLUTION) IMPLANT
IV NS 500ML (IV SOLUTION) ×2
IV NS 500ML BAXH (IV SOLUTION) ×1 IMPLANT
IV SET EXT 30 76VOL 4 MALE LL (IV SETS) IMPLANT
NEEDLE PRECISIONGLIDE 27X1.5 (NEEDLE) ×2 IMPLANT
NEEDLE SPNL 25GX3.5 QUINCKE BL (NEEDLE) IMPLANT
NS IRRIG 1000ML POUR BTL (IV SOLUTION) ×2 IMPLANT
PACK BASIN DAY SURGERY FS (CUSTOM PROCEDURE TRAY) ×2 IMPLANT
PACK ENT DAY SURGERY (CUSTOM PROCEDURE TRAY) ×2 IMPLANT
PENCIL SMOKE EVACUATOR (MISCELLANEOUS) IMPLANT
SLEEVE SCD COMPRESS KNEE MED (STOCKING) ×2 IMPLANT
SOLUTION BUTLER CLEAR DIP (MISCELLANEOUS) ×2 IMPLANT
SPLINT NASAL AIRWAY SILICONE (MISCELLANEOUS) IMPLANT
SPONGE GAUZE 2X2 8PLY STRL LF (GAUZE/BANDAGES/DRESSINGS) ×2 IMPLANT
SPONGE NEURO XRAY DETECT 1X3 (DISPOSABLE) ×2 IMPLANT
SUT ETHILON 3 0 PS 1 (SUTURE) IMPLANT
SUT PLAIN 4 0 ~~LOC~~ 1 (SUTURE) IMPLANT
SUT SILK 3 0 PS 1 (SUTURE) IMPLANT
SYR 3ML 23GX1 SAFETY (SYRINGE) IMPLANT
TOWEL GREEN STERILE FF (TOWEL DISPOSABLE) ×4 IMPLANT
TRACKER ENT INSTRUMENT (MISCELLANEOUS) ×2 IMPLANT
TRACKER ENT PATIENT (MISCELLANEOUS) ×2 IMPLANT
TUBE CONNECTING 20X1/4 (TUBING) ×2 IMPLANT
TUBE SALEM SUMP 12R W/ARV (TUBING) IMPLANT
TUBE SALEM SUMP 16 FR W/ARV (TUBING) ×2 IMPLANT
TUBING STRAIGHTSHOT EPS 5PK (TUBING) ×2 IMPLANT
YANKAUER SUCT BULB TIP NO VENT (SUCTIONS) ×2 IMPLANT

## 2021-07-11 NOTE — H&P (Signed)
Tracy Gray is an 55 y.o. female.   Chief Complaint: Chronic sinusitis HPI: Hx of sinusitis  Past Medical History:  Diagnosis Date   Adenomatous colon polyp 03/26/2018   3 adenomas, max 80mm   Allergy    Anxiety    Asthma    Chronic headache    COPD (chronic obstructive pulmonary disease) (HCC)    Degenerative disc disease    Fibromyalgia    GERD (gastroesophageal reflux disease)    Hyperlipidemia    Hypertension    Multiple sclerosis (HCC)    Nerve damage    OSA (obstructive sleep apnea)    not on CPAP    Sleep apnea     Past Surgical History:  Procedure Laterality Date   APPENDECTOMY     COLONOSCOPY     ECTOPIC PREGNANCY SURGERY     ESOPHAGEAL MANOMETRY N/A 09/07/2018   Procedure: ESOPHAGEAL MANOMETRY (EM);  Surgeon: Mauri Pole, MD;  Location: [facility];  Service: Endoscopy;  Laterality: N/A;   ESOPHAGOGASTRODUODENOSCOPY     EXPLORATORY LAPAROTOMY     SINUS IRRIGATION     TEMPOROMANDIBULAR JOINT SURGERY     TUBAL LIGATION      Family History  Problem Relation Age of Onset   Emphysema Mother        never smoked   Irritable bowel syndrome Mother    Hiatal hernia Mother    Other Mother        esophageal stricture   Emphysema Father        smoked   Prostate cancer Father    Breast cancer Paternal 77    Asthma Daughter    Asthma Son    Asthma Grandchild    Cirrhosis Maternal Uncle        alcohol related x 6 uncles   Esophageal cancer Neg Hx    Colon cancer Neg Hx    Pancreatic cancer Neg Hx    Stomach cancer Neg Hx    Social History:  reports that she quit smoking about 3 years ago. Her smoking use included cigarettes. She has a 21.00 pack-year smoking history. She has never used smokeless tobacco. She reports that she does not drink alcohol and does not use drugs.  Allergies:  Allergies  Allergen Reactions   Bee Venom Anaphylaxis   Penicillins Anaphylaxis   Pantoprazole Sodium Diarrhea    Medications Prior to Admission   Medication Sig Dispense Refill   albuterol (PROVENTIL) (2.5 MG/3ML) 0.083% nebulizer solution Take 2.5 mg by nebulization every 4 (four) hours as needed for wheezing or shortness of breath. PLAN C     albuterol (VENTOLIN HFA) 108 (90 Base) MCG/ACT inhaler Inhale 2 puffs into the lungs every 4 (four) hours as needed for wheezing or shortness of breath. PLAN B     amLODipine (NORVASC) 10 MG tablet Take 10 mg by mouth daily.     budesonide (PULMICORT) 0.25 MG/2ML nebulizer solution Take 0.25 mg by nebulization 2 (two) times daily.     clonazePAM (KLONOPIN) 1 MG tablet Take 1 mg by mouth 2 (two) times daily.     famotidine (PEPCID) 20 MG tablet TAKE 2 TABLETS BY MOUTH EVERY NIGHT AT BEDTIME 484 tablet 0   famotidine (PEPCID) 40 MG tablet Take 1 tablet (40 mg total) by mouth at bedtime. 90 tablet 3   fluticasone (FLONASE) 50 MCG/ACT nasal spray Place 2 sprays into both nostrils daily.     HYDROcodone-acetaminophen (NORCO) 7.5-325 MG tablet Take 1 tablet by mouth every 6 (  six) hours as needed.      losartan (COZAAR) 50 MG tablet Take 50 mg by mouth daily.     montelukast (SINGULAIR) 10 MG tablet Take 10 mg by mouth at bedtime.     QUININE SULFATE PO Take 300 mg by mouth at bedtime.     Respiratory Therapy Supplies (FLUTTER) DEVI Use as directed 1 each 0   rosuvastatin (CRESTOR) 20 MG tablet Take 20 mg by mouth daily.     SALINE NASAL SPRAY NA      Tiotropium Bromide-Olodaterol (STIOLTO RESPIMAT) 2.5-2.5 MCG/ACT AERS Inhale 2 puffs into the lungs daily. 4 g 5   EPINEPHrine 0.3 mg/0.3 mL IJ SOAJ injection Inject 0.3 mg into the muscle as needed.       No results found for this or any previous visit (from the past 48 hour(s)). No results found.  Review of Systems  Constitutional: Negative.   HENT:  Positive for congestion and rhinorrhea.   Respiratory: Negative.    Cardiovascular: Negative.    Blood pressure 107/69, pulse 95, temperature 98.1 F (36.7 C), temperature source Oral, height 5\' 4"   (1.626 m), weight 72.7 kg, SpO2 98 %. Physical Exam Constitutional:      Appearance: She is normal weight.  HENT:     Nose:     Comments: Nasal obstruction Cardiovascular:     Rate and Rhythm: Normal rate.  Pulmonary:     Effort: Pulmonary effort is normal.  Musculoskeletal:     Cervical back: Normal range of motion.  Neurological:     Mental Status: She is alert.     Assessment/Plan Adm for ESS for OP surgery  Jerrell Belfast, MD 07/11/2021, 8:29 AM

## 2021-07-11 NOTE — Discharge Instructions (Addendum)
No Tylenol before 2:00pm if needed.   Post Anesthesia Home Care Instructions  Activity: Get plenty of rest for the remainder of the day. A responsible individual must stay with you for 24 hours following the procedure.  For the next 24 hours, DO NOT: -Drive a car -Paediatric nurse -Drink alcoholic beverages -Take any medication unless instructed by your physician -Make any legal decisions or sign important papers.  Meals: Start with liquid foods such as gelatin or soup. Progress to regular foods as tolerated. Avoid greasy, spicy, heavy foods. If nausea and/or vomiting occur, drink only clear liquids until the nausea and/or vomiting subsides. Call your physician if vomiting continues.  Special Instructions/Symptoms: Your throat may feel dry or sore from the anesthesia or the breathing tube placed in your throat during surgery. If this causes discomfort, gargle with warm salt water. The discomfort should disappear within 24 hours.  If you had a scopolamine patch placed behind your ear for the management of post- operative nausea and/or vomiting:  1. The medication in the patch is effective for 72 hours, after which it should be removed.  Wrap patch in a tissue and discard in the trash. Wash hands thoroughly with soap and water. 2. You may remove the patch earlier than 72 hours if you experience unpleasant side effects which may include dry mouth, dizziness or visual disturbances. 3. Avoid touching the patch. Wash your hands with soap and water after contact with the patch.

## 2021-07-11 NOTE — Anesthesia Preprocedure Evaluation (Signed)
Anesthesia Evaluation  Patient identified by MRN, date of birth, ID band Patient awake    Reviewed: Allergy & Precautions, NPO status , Patient's Chart, lab work & pertinent test results  Airway Mallampati: II  TM Distance: >3 FB Neck ROM: Full    Dental  (+) Dental Advisory Given   Pulmonary asthma , sleep apnea , COPD, former smoker,    breath sounds clear to auscultation       Cardiovascular hypertension, Pt. on medications  Rhythm:Regular Rate:Normal     Neuro/Psych  Headaches,    GI/Hepatic Neg liver ROS, GERD  ,  Endo/Other  negative endocrine ROS  Renal/GU negative Renal ROS     Musculoskeletal  (+) Arthritis ,   Abdominal   Peds  Hematology negative hematology ROS (+)   Anesthesia Other Findings   Reproductive/Obstetrics                             Anesthesia Physical Anesthesia Plan  ASA: 3  Anesthesia Plan: General   Post-op Pain Management:    Induction: Intravenous  PONV Risk Score and Plan: 4 or greater and Ondansetron, Dexamethasone, Midazolam, Treatment may vary due to age or medical condition and Propofol infusion  Airway Management Planned: Oral ETT  Additional Equipment: None  Intra-op Plan:   Post-operative Plan: Extubation in OR  Informed Consent: I have reviewed the patients History and Physical, chart, labs and discussed the procedure including the risks, benefits and alternatives for the proposed anesthesia with the patient or authorized representative who has indicated his/her understanding and acceptance.     Dental advisory given  Plan Discussed with: CRNA  Anesthesia Plan Comments:         Anesthesia Quick Evaluation

## 2021-07-11 NOTE — Transfer of Care (Signed)
Immediate Anesthesia Transfer of Care Note  Patient: Tracy Gray  Procedure(s) Performed: ENDOSCOPIC SINUS SURGERY WITH FUSION NAVIGATION (Bilateral: Nose) INFERIOR TURBINATE REDUCTION (Bilateral: Nose)  Patient Location: PACU  Anesthesia Type:General  Level of Consciousness: awake, alert , oriented and patient cooperative  Airway & Oxygen Therapy: Patient Spontanous Breathing and Patient connected to face mask oxygen  Post-op Assessment: Report given to RN and Post -op Vital signs reviewed and stable  Post vital signs: Reviewed and stable  Last Vitals:  Vitals Value Taken Time  BP    Temp    Pulse 76 07/11/21 0949  Resp    SpO2 97 % 07/11/21 0949  Vitals shown include unvalidated device data.  Last Pain:  Vitals:   07/11/21 0701  TempSrc: Oral  PainSc: 0-No pain         Complications: No notable events documented.

## 2021-07-11 NOTE — Op Note (Signed)
Operative Note:  ENDOSCOPIC SINUS SURGERY WITH NAVIGATION    INFERIOR TURBINATE REDUCTION  Patient: Tracy Gray record number: [id number]  Date:07/11/2021  Pre-operative Indications: 1.  Chronic Sinusitis     2.  Bilateral inferior turbinate hypertrophy  Postoperative Indications: Same  Surgical Procedure: 1.  Bilateral endoscopic sinus surgery with intraoperative computer-assisted navigation consisting of: Bilateral total ethmoidectomy, bilateral nasal frontal recess exploration, bilateral maxillary antrostomy with removal of diseased tissue    2.  Bilateral Inferior Turbinate Reduction  Anesthesia: GET  Surgeon: Delsa Bern, M.D.  Complications: None  EBL: 100 cc  Findings: Moderate mucosal thickening and obstruction of the ostiomeatal complex bilaterally, no purulent discharge or evidence of active infection.  Bilateral inferior turbinate hypertrophy.  Nasa-pore nasal packing placed.   Brief History: The patient is a 55 y.o. female with a history of chronic sinusitis and turbinate hypertrophy. The patient has been on medical therapy to reduce nasal mucosal edema and infection including antibiotics, saline nasal spray and topical nasal steroids. Despite appropriate medical therapy the patient continues to have ongoing symptoms. Given the patient's history and findings, the above surgical procedures were recommended, risks and benefits were discussed in detail with the patient may understand and agree with our plan for surgery which is scheduled at [facility] under general anesthesia as an outpatient.  Surgical Procedure: The patient is brought to the operating room on 07/11/2021 and placed in supine position on the operating table. General endotracheal anesthesia was established without difficulty. When the patient was adequately anesthetized, surgical timeout was performed with correct identification of the patient and the surgical procedure. The patient's  nose was then injected with 5 cc of 1% lidocaine 1:100,000 dilution epinephrine which was injected in a submucosal fashion. The patient's nose was then packed with Afrin-soaked cottonoid pledgets were left in place for approximately 10 minutes to allow for vasoconstriction and hemostasis.  The Xomed Fusion navigation headgear was applied in anatomic and surgical landmarks were identified and confirmed, navigation was used throughout the sinus component of the surgical procedure.  With the patient prepped draped and prepared for surgery, nasal nasal endoscopy was performed on the left side.  Using a 0 degree endoscope and a straight microdebrider, a total ethmoidectomy was performed dissecting from anterior to posterior along the floor of the ethmoid sinus removing bony septations and diseased mucosa.  Using a 45 degree telescope and a curved microdebrider dissection was then carried out along the roof of the ethmoid sinus with navigation, completing a total ethmoidectomy.  Attention was then turned to the nasal frontal recess and again using a curved microdebrider, navigation and endoscopic visualization the nasal frontal recess was widely open, underlying ethmoid cells and diseased mucosa resected creating a widely patent nasal frontal recess.  The lateral nasal wall was inspected, uncinate process was resected using a through-cutting forcep and the natural ostium of the maxillary sinus was enlarged in a posterior and inferior direction.  Within the maxillary sinus thick mucoid material and polypoid soft tissue was resected.  The patient had an area of orbital dehiscence with exposed orbital fat along the inferior aspect of the anterior left orbit.  No evidence of bleeding or other significant abnormality.  The patient's right side was then inspected and total ethmoidectomy was performed using a 0 degree telescope and straight microdebrider along the floor of the ethmoid sinus, a 45 degree telescope and curved  microdebrider with navigation was used along the roof the ethmoid sinus from posterior  to anterior to complete a total ethmoidectomy.  The nasal frontal recess was then explored and underlying ethmoid disease was resected with a curved microdebrider, the nasal frontal recess was widely opened and diseased material was resected from within the sinus.  Attention was then turned to the lateral nasal wall, the natural ostium maxillary sinus was identified. The uncinate process was resected.  Diseased mucosa from within the maxilla sinus was then cleared and the ostium was enlarged in a posterior and inferior direction.    Attention was then turned to the inferior turbinates, bilateral inferior turbinate intramural cautery was performed with cautery setting at 95 W.  2 submucosal passes were made in each inferior turbinate.  After completing cautery, anterior vertical incisions were created and overlying soft tissue was elevated, a small amount of turbinate bone was resected.  The turbinates were then outfractured to create a more patent nasal passageway.  At the conclusion of the procedure, Nasa-pore nasal packing was placed in the common ethmoid cavity bilaterally.  Surgical sponge count was correct. An oral gastric tube was passed and the stomach contents were aspirated. Patient was awakened from anesthetic and transferred from the operating room to the recovery room in stable condition. There were no complications and blood loss was 100 cc.   Delsa Bern, M.D. [facility] 07/11/2021

## 2021-07-11 NOTE — Anesthesia Procedure Notes (Signed)
Procedure Name: Intubation Date/Time: 07/11/2021 8:43 AM Performed by: Signe Colt, CRNA Pre-anesthesia Checklist: Patient identified, Emergency Drugs available, Suction available and Patient being monitored Patient Re-evaluated:Patient Re-evaluated prior to induction Oxygen Delivery Method: Circle system utilized Preoxygenation: Pre-oxygenation with 100% oxygen Induction Type: IV induction Ventilation: Mask ventilation without difficulty Laryngoscope Size: Mac and 3 Grade View: Grade I Tube type: Oral Tube size: 7.0 mm Number of attempts: 1 Airway Equipment and Method: Stylet and Oral airway Placement Confirmation: ETT inserted through vocal cords under direct vision, positive ETCO2 and breath sounds checked- equal and bilateral Secured at: 21 cm Tube secured with: Tape Dental Injury: Teeth and Oropharynx as per pre-operative assessment

## 2021-07-12 ENCOUNTER — Encounter (HOSPITAL_BASED_OUTPATIENT_CLINIC_OR_DEPARTMENT_OTHER): Payer: Self-pay | Admitting: Otolaryngology

## 2021-07-12 LAB — SURGICAL PATHOLOGY

## 2021-07-12 NOTE — Anesthesia Postprocedure Evaluation (Signed)
Anesthesia Post Note  Patient: Tracy Gray  Procedure(s) Performed: ENDOSCOPIC SINUS SURGERY WITH FUSION NAVIGATION (Bilateral: Nose) BILATERAL INFERIOR TURBINATE REDUCTION (Bilateral: Nose)     Patient location during evaluation: PACU Anesthesia Type: General Level of consciousness: awake and alert Pain management: pain level controlled Vital Signs Assessment: post-procedure vital signs reviewed and stable Respiratory status: spontaneous breathing, nonlabored ventilation, respiratory function stable and patient connected to nasal cannula oxygen Cardiovascular status: blood pressure returned to baseline and stable Postop Assessment: no apparent nausea or vomiting Anesthetic complications: no   No notable events documented.  Last Vitals:  Vitals:   07/11/21 1130 07/11/21 1143  BP: (!) 106/55 120/75  Pulse: 78 80  Resp: 13 15  Temp:  36.6 C  SpO2: 92% 92%    Last Pain:  Vitals:   07/12/21 0852  TempSrc:   PainSc: 0-No pain                 Tiajuana Amass

## 2021-07-19 ENCOUNTER — Ambulatory Visit: Payer: Medicare Other | Admitting: Physician Assistant

## 2021-08-16 ENCOUNTER — Encounter: Payer: Self-pay | Admitting: Physician Assistant

## 2021-08-16 ENCOUNTER — Ambulatory Visit (INDEPENDENT_AMBULATORY_CARE_PROVIDER_SITE_OTHER): Payer: Medicare Other | Admitting: Physician Assistant

## 2021-08-16 VITALS — BP 120/60 | HR 94 | Ht 64.0 in | Wt 159.0 lb

## 2021-08-16 DIAGNOSIS — R1084 Generalized abdominal pain: Secondary | ICD-10-CM

## 2021-08-16 DIAGNOSIS — R197 Diarrhea, unspecified: Secondary | ICD-10-CM | POA: Diagnosis not present

## 2021-08-16 DIAGNOSIS — Z86010 Personal history of colonic polyps: Secondary | ICD-10-CM

## 2021-08-16 NOTE — Progress Notes (Signed)
Chief Complaint: Change in bowel habits  HPI:    Tracy Gray is a 55 year old female with a past medical history of COPD, GERD, OSA and others listed below, known to Dr. Carlean Purl, who presents to clinic today for change in bowel habits.    03/26/2018 colonoscopy with one 10 mm polyp in the sigmoid colon, 2 diminutive polyps in the descending and ascending colon and otherwise normal.  Pathology showed 2 tubular adenoma, repeat recommended in 3 years.    03/26/2018 EGD with LA grade a reflux esophagitis and dilation in the entire esophagus.  Continued on twice daily PPI.    08/11/2018 esophagram a small hiatal hernia with moderate gastroesophageal reflux.  Negative for stricture.    09/07/2018 esophageal manometry with findings of EGD outflow obstruction.  It was discussed with her at that time that narcotics have been implicated in esophageal dysfunction and recommend she stop them.  Also recommended a dysphagia 3 diet.  Discussed that she is still coughing and straining in the liquids then would recommend a modified barium swallow to check the oral and pharyngeal functions more.    Today, explains that she has been on antibiotics for upper respiratory infection/sinus infection from November until July.  She recently underwent surgery to fix this and it is slowly getting better.  Tells me that around December timeframe she started with diarrhea which was so severe that they started her on a probiotic which she has been taking since then.  Now explains and it seems to have worsened over the past 4 to 5 months.  Tells me that she has diarrhea most days but then 2 days a week she will be so constipated that she has generalized abdominal pain and swelling and often has to strain for a hard stool followed by a lot more diarrhea.  Tells me that anytime she eats, within 10 minutes she has loose urgent stool.  Oftentimes she wakes up from a dead sleep to run to the bathroom to have a loose stool.    Reflux under good  control with Pantoprazole 20 mg once daily and Pepcid 20 mg nightly.    Denies fever, chills, weight loss or blood in her stool.  Past Medical History:  Diagnosis Date   Adenomatous colon polyp 03/26/2018   3 adenomas, max 39m   Allergy    Anxiety    Asthma    Chronic headache    COPD (chronic obstructive pulmonary disease) (HCC)    Degenerative disc disease    Fibromyalgia    GERD (gastroesophageal reflux disease)    Hyperlipidemia    Hypertension    Multiple sclerosis (HCC)    Nerve damage    OSA (obstructive sleep apnea)    not on CPAP    Sleep apnea     Past Surgical History:  Procedure Laterality Date   APPENDECTOMY     COLONOSCOPY     ECTOPIC PREGNANCY SURGERY     ESOPHAGEAL MANOMETRY N/A 09/07/2018   Procedure: ESOPHAGEAL MANOMETRY (EM);  Surgeon: NMauri Pole MD;  Location: [facility];  Service: Endoscopy;  Laterality: N/A;   ESOPHAGOGASTRODUODENOSCOPY     EXPLORATORY LAPAROTOMY     SINUS ENDO W/FUSION Bilateral 07/11/2021   Procedure: ENDOSCOPIC SINUS SURGERY WITH FUSION NAVIGATION;  Surgeon: SJerrell Belfast MD;  Location: [facility]  Service: ENT;  Laterality: Bilateral;   SINUS IRRIGATION     TEMPOROMANDIBULAR JOINT SURGERY     TUBAL LIGATION     TURBINATE REDUCTION Bilateral  07/11/2021   Procedure: BILATERAL INFERIOR TURBINATE REDUCTION;  Surgeon: Jerrell Belfast, MD;  Location: [place];  Service: ENT;  Laterality: Bilateral;    Current Outpatient Medications  Medication Sig Dispense Refill   pantoprazole (PROTONIX) 20 MG tablet Take 20 mg by mouth daily.     albuterol (PROVENTIL) (2.5 MG/3ML) 0.083% nebulizer solution Take 2.5 mg by nebulization every 4 (four) hours as needed for wheezing or shortness of breath. PLAN C     albuterol (VENTOLIN HFA) 108 (90 Base) MCG/ACT inhaler Inhale 2 puffs into the lungs every 4 (four) hours as needed for wheezing or shortness of breath. PLAN B     amLODipine (NORVASC) 10 MG  tablet Take 10 mg by mouth daily.     budesonide (PULMICORT) 0.25 MG/2ML nebulizer solution Take 0.25 mg by nebulization 2 (two) times daily.     clonazePAM (KLONOPIN) 1 MG tablet Take 1 mg by mouth 2 (two) times daily.     EPINEPHrine 0.3 mg/0.3 mL IJ SOAJ injection Inject 0.3 mg into the muscle as needed.      famotidine (PEPCID) 20 MG tablet TAKE 2 TABLETS BY MOUTH EVERY NIGHT AT BEDTIME 484 tablet 0   famotidine (PEPCID) 40 MG tablet Take 1 tablet (40 mg total) by mouth at bedtime. 90 tablet 3   HYDROcodone-acetaminophen (NORCO) 7.5-325 MG tablet Take 1 tablet by mouth every 6 (six) hours as needed.      levofloxacin (LEVAQUIN) 500 MG tablet Take 1 tablet (500 mg total) by mouth daily. 1 tab po qd 7 tablet 0   losartan (COZAAR) 50 MG tablet Take 50 mg by mouth daily.     montelukast (SINGULAIR) 10 MG tablet Take 10 mg by mouth at bedtime.     QUININE SULFATE PO Take 300 mg by mouth at bedtime.     Respiratory Therapy Supplies (FLUTTER) DEVI Use as directed 1 each 0   rosuvastatin (CRESTOR) 20 MG tablet Take 20 mg by mouth daily.     SALINE NASAL SPRAY NA      Tiotropium Bromide-Olodaterol (STIOLTO RESPIMAT) 2.5-2.5 MCG/ACT AERS Inhale 2 puffs into the lungs daily. 4 g 5   No current facility-administered medications for this visit.    Allergies as of 08/16/2021 - Review Complete 08/16/2021  Allergen Reaction Noted   Bee venom Anaphylaxis 10/09/2012   Penicillins Anaphylaxis 10/09/2012   Pantoprazole sodium Diarrhea 12/08/2018    Family History  Problem Relation Age of Onset   Emphysema Mother        never smoked   Irritable bowel syndrome Mother    Hiatal hernia Mother    Other Mother        esophageal stricture   Emphysema Father        smoked   Prostate cancer Father    Breast cancer Paternal 56    Asthma Daughter    Asthma Son    Asthma Grandchild    Cirrhosis Maternal Uncle        alcohol related x 6 uncles   Esophageal cancer Neg Hx    Colon cancer Neg Hx     Pancreatic cancer Neg Hx    Stomach cancer Neg Hx     Social History   Socioeconomic History   Marital status: Legally Separated    Spouse name: Not on file   Number of children: 3   Years of education: Not on file   Highest education level: Not on file  Occupational History   Occupation: Unemployed  Tobacco Use   Smoking status: Former    Packs/day: 0.50    Years: 42.00    Pack years: 21.00    Types: Cigarettes    Quit date: 06/03/2018    Years since quitting: 3.2   Smokeless tobacco: Never   Tobacco comments:       Vaping Use   Vaping Use: Former   Quit date: 12/23/2018  Substance and Sexual Activity   Alcohol use: No    Alcohol/week: 0.0 standard drinks   Drug use: No   Sexual activity: Not on file    Comment: BTL  Other Topics Concern   Not on file  Social History Narrative   Legally separated not working on Medicare disability   3 children some grandchildren   Smoker no alcohol   No cigarettes x2 months as of 12/08/2018      Social Determinants of Health   Financial Resource Strain: Not on file  Food Insecurity: Not on file  Transportation Needs: Not on file  Physical Activity: Not on file  Stress: Not on file  Social Connections: Not on file  Intimate Partner Violence: Not on file    Review of Systems:    Constitutional: No weight loss, fever or chills Cardiovascular: No chest pain Respiratory: No SOB  Gastrointestinal: See HPI and otherwise negative   Physical Exam:  Vital signs: BP 120/60   Pulse 94   Ht '5\' 4"'$  (1.626 m)   Wt 159 lb (72.1 kg)   BMI 27.29 kg/m    Constitutional:   Pleasant Caucasian female appears to be in NAD, Well developed, Well nourished, alert and cooperative Respiratory: Respirations even and unlabored. Lungs clear to auscultation bilaterally.   No wheezes, crackles, or rhonchi.  Cardiovascular: Normal S1, S2. No MRG. Regular rate and rhythm. No peripheral edema, cyanosis or pallor.  Gastrointestinal:  Soft,  nondistended, nontender. No rebound or guarding. Normal bowel sounds. No appreciable masses or hepatomegaly. Rectal:  Not performed.  Psychiatric:  Demonstrates good judgement and reason without abnormal affect or behaviors.  No recent labs.  Assessment: 1.  Change in bowel habits: Towards diarrhea over the past 6 months since being on very strong antibiotics for a sinus infection; consider infectious cause versus inflammatory versus IBS 2.  History of adenomatous polyps: Last colonoscopy in 2019 with repeat recommended in 3 years, she is overdue for surveillance  Plan: 1.  Discussed with patient that she is due for surveillance colonoscopy.  At this time with her diarrhea we will need to rule out infectious cause before we schedule. 2.  Order GI pathogen panel which includes C. difficile testing 3.  Recommend the patient start a fiber submit such as Benefiber on a daily basis.  Discussed titration of this. 4.  Continue probiotic for now. 5.  Continue Pantoprazole 20 mg daily and Pepcid nightly. 6.  Explained to patient that once we receive results from stool studies if they are negative we will go ahead and schedule her for colonoscopy. If they are positive then we will need to treat her before performing her surveillance colonoscopy. 7.  Patient to follow clinic per recommendations after testing.  Ellouise Newer, PA-C [facility] 08/16/2021, 8:58 AM  Cc: Lorene Dy, MD

## 2021-08-16 NOTE — Patient Instructions (Addendum)
It was my pleasure to provide care to you today. Based on our discussion, I am providing you with my recommendations below:  RECOMMENDATION(S):   Please start fiber supplement daily such as Benefiber or Metamucil  LABS:   Please proceed to the basement level for lab work before leaving today. Press "B" on the elevator. The lab is located at the first door on the left as you exit the elevator.  HEALTHCARE LAWS AND MY CHART RESULTS:   Due to recent changes in healthcare laws, you may see results of your imaging and/or laboratory studies on MyChart before I have had a chance to review them.  I understand that in some cases there may be results that are confusing or concerning to you. Please understand that not all results are received at same time and often I may need to interpret multiple results in order to provide you with the best plan of care or course of treatment. Therefore, I ask that you please give me 48 hours to thoroughly review all your results before contacting my office for clarification.    FOLLOW UP:  As needed  BMI:  If you are age 56 or younger, your body mass index should be between 19-25. Your There is no height or weight on file to calculate BMI. If this is out of the aformentioned range listed, please consider follow up with your Primary Care Provider.   MY CHART:  The [facility] providers would like to encourage you to use [facility] to communicate with providers for non-urgent requests or questions.  Due to long hold times on the telephone, sending your provider a message by [facility] may be a faster and more efficient way to get a response.  Please allow 48 business hours for a response.  Please remember that this is for non-urgent requests.   Thank you for trusting me with your gastrointestinal care!    Ellouise Newer, [place]

## 2021-08-24 ENCOUNTER — Emergency Department (HOSPITAL_COMMUNITY): Payer: Medicare Other

## 2021-08-24 ENCOUNTER — Encounter (HOSPITAL_COMMUNITY): Payer: Self-pay

## 2021-08-24 ENCOUNTER — Other Ambulatory Visit: Payer: Self-pay

## 2021-08-24 ENCOUNTER — Ambulatory Visit (HOSPITAL_COMMUNITY)
Admission: EM | Admit: 2021-08-24 | Discharge: 2021-08-25 | Disposition: A | Discharge status: Home / Self Care | Payer: Medicare Other | Attending: Emergency Medicine | Admitting: Emergency Medicine

## 2021-08-24 DIAGNOSIS — T189XXA Foreign body of alimentary tract, part unspecified, initial encounter: Secondary | ICD-10-CM

## 2021-08-24 DIAGNOSIS — Z87891 Personal history of nicotine dependence: Secondary | ICD-10-CM | POA: Insufficient documentation

## 2021-08-24 DIAGNOSIS — Z79899 Other long term (current) drug therapy: Secondary | ICD-10-CM | POA: Diagnosis not present

## 2021-08-24 DIAGNOSIS — T17208A Unspecified foreign body in pharynx causing other injury, initial encounter: Secondary | ICD-10-CM

## 2021-08-24 DIAGNOSIS — Z880 Allergy status to penicillin: Secondary | ICD-10-CM | POA: Insufficient documentation

## 2021-08-24 DIAGNOSIS — Z20822 Contact with and (suspected) exposure to covid-19: Secondary | ICD-10-CM | POA: Diagnosis not present

## 2021-08-24 DIAGNOSIS — Z91030 Bee allergy status: Secondary | ICD-10-CM | POA: Insufficient documentation

## 2021-08-24 DIAGNOSIS — Z7951 Long term (current) use of inhaled steroids: Secondary | ICD-10-CM | POA: Insufficient documentation

## 2021-08-24 DIAGNOSIS — R1310 Dysphagia, unspecified: Secondary | ICD-10-CM | POA: Insufficient documentation

## 2021-08-24 DIAGNOSIS — Z888 Allergy status to other drugs, medicaments and biological substances status: Secondary | ICD-10-CM | POA: Insufficient documentation

## 2021-08-24 DIAGNOSIS — Z87892 Personal history of anaphylaxis: Secondary | ICD-10-CM | POA: Insufficient documentation

## 2021-08-24 NOTE — ED Provider Notes (Signed)
Emergency Medicine Provider Triage Evaluation Note  Tracy Gray , a 55 y.o. female  was evaluated in triage.  Pt complains of feeling like chicken bone stuck in throat.  Reports history of esophageal tear in the past.  States has tried to drink water but will not go all the way down then begins vomiting.  Recent sinus surgery 3 weeks ago with ENT, Dr. Wilburn Cornelia.  Review of Systems  Positive: Esophageal FB, vomiting Negative: fever  Physical Exam  BP 107/73 (BP Location: Left Arm)   Pulse 88   Temp 98.8 F (37.1 C) (Oral)   Resp 20   SpO2 99%   Gen:   Awake, no distress   Resp:  Normal effort  MSK:   Moves extremities without difficulty  Other:  No visible FB of throat, speaking clearly, handling secretions well, no stridor  Medical Decision Making  Medically screening exam initiated at 11:02 PM.  Appropriate orders placed.  Tracy Gray was informed that the remainder of the evaluation will be completed by another provider, this initial triage assessment does not replace that evaluation, and the importance of remaining in the ED until their evaluation is complete.  No airway compromise at present.  DG soft tissue neck ordered.   Larene Pickett, PA-C 08/24/21 2306    Ripley Fraise, MD 08/25/21 Quentin Mulling

## 2021-08-24 NOTE — ED Triage Notes (Signed)
Pt states she had chicken and rice and feels like she has a chicken bone stuck in throat.. Pt states she is not able to drink water without it feeling like it gets stuck. Pt states she has had vomiting and now her throat is hoarse and painful. Pt also had sinus surgery three weeks ago.

## 2021-08-24 NOTE — ED Notes (Signed)
Patient to come to room from scan.

## 2021-08-25 ENCOUNTER — Encounter (HOSPITAL_COMMUNITY): Payer: Self-pay | Admitting: Anesthesiology

## 2021-08-25 ENCOUNTER — Encounter (HOSPITAL_COMMUNITY)
Admission: EM | Disposition: A | Discharge status: Home / Self Care | Payer: Self-pay | Source: Home / Self Care | Attending: Emergency Medicine

## 2021-08-25 ENCOUNTER — Emergency Department (HOSPITAL_COMMUNITY): Payer: Medicare Other | Admitting: Anesthesiology

## 2021-08-25 DIAGNOSIS — Z880 Allergy status to penicillin: Secondary | ICD-10-CM | POA: Diagnosis not present

## 2021-08-25 DIAGNOSIS — T17208A Unspecified foreign body in pharynx causing other injury, initial encounter: Secondary | ICD-10-CM

## 2021-08-25 DIAGNOSIS — R1310 Dysphagia, unspecified: Secondary | ICD-10-CM | POA: Diagnosis not present

## 2021-08-25 DIAGNOSIS — Z87891 Personal history of nicotine dependence: Secondary | ICD-10-CM | POA: Diagnosis not present

## 2021-08-25 DIAGNOSIS — T189XXA Foreign body of alimentary tract, part unspecified, initial encounter: Secondary | ICD-10-CM

## 2021-08-25 DIAGNOSIS — Z20822 Contact with and (suspected) exposure to covid-19: Secondary | ICD-10-CM | POA: Diagnosis not present

## 2021-08-25 HISTORY — PX: DIRECT LARYNGOSCOPY: SHX5326

## 2021-08-25 LAB — RESP PANEL BY RT-PCR (FLU A&B, COVID) ARPGX2
Influenza A by PCR: NEGATIVE
Influenza B by PCR: NEGATIVE
SARS Coronavirus 2 by RT PCR: NEGATIVE

## 2021-08-25 SURGERY — LARYNGOSCOPY, DIRECT
Anesthesia: General | Site: Throat

## 2021-08-25 MED ORDER — LIDOCAINE 2% (20 MG/ML) 5 ML SYRINGE
INTRAMUSCULAR | Status: AC
  Filled 2021-08-25: qty 5

## 2021-08-25 MED ORDER — CIPROFLOXACIN IN D5W 400 MG/200ML IV SOLN
INTRAVENOUS | Status: AC
  Filled 2021-08-25: qty 200

## 2021-08-25 MED ORDER — EPINEPHRINE PF 1 MG/ML IJ SOLN
INTRAMUSCULAR | Status: AC
  Filled 2021-08-25: qty 1

## 2021-08-25 MED ORDER — ROCURONIUM BROMIDE 10 MG/ML (PF) SYRINGE
PREFILLED_SYRINGE | INTRAVENOUS | Status: AC
  Filled 2021-08-25: qty 10

## 2021-08-25 MED ORDER — MIDAZOLAM HCL 2 MG/2ML IJ SOLN
INTRAMUSCULAR | Status: AC
  Filled 2021-08-25: qty 2

## 2021-08-25 MED ORDER — FENTANYL CITRATE (PF) 100 MCG/2ML IJ SOLN: 25.0000 ug | INTRAMUSCULAR | Status: DC | PRN

## 2021-08-25 MED ORDER — ALBUTEROL SULFATE HFA 108 (90 BASE) MCG/ACT IN AERS
INHALATION_SPRAY | RESPIRATORY_TRACT | Status: DC | PRN
  Administered 2021-08-25: 2 via RESPIRATORY_TRACT

## 2021-08-25 MED ORDER — MIDAZOLAM HCL 5 MG/5ML IJ SOLN
INTRAMUSCULAR | Status: DC | PRN
  Administered 2021-08-25: 2 mg via INTRAVENOUS

## 2021-08-25 MED ORDER — SUCCINYLCHOLINE CHLORIDE 200 MG/10ML IV SOSY
PREFILLED_SYRINGE | INTRAVENOUS | Status: AC
  Filled 2021-08-25: qty 10

## 2021-08-25 MED ORDER — SUCCINYLCHOLINE CHLORIDE 200 MG/10ML IV SOSY
PREFILLED_SYRINGE | INTRAVENOUS | Status: DC | PRN
  Administered 2021-08-25: 120 mg via INTRAVENOUS

## 2021-08-25 MED ORDER — LACTATED RINGERS IV SOLN: INTRAVENOUS | Status: DC | PRN

## 2021-08-25 MED ORDER — FENTANYL CITRATE (PF) 250 MCG/5ML IJ SOLN
INTRAMUSCULAR | Status: DC | PRN
  Administered 2021-08-25 (×5): 50 ug via INTRAVENOUS

## 2021-08-25 MED ORDER — PROMETHAZINE HCL 25 MG/ML IJ SOLN: 6.2500 mg | INTRAMUSCULAR | Status: DC | PRN

## 2021-08-25 MED ORDER — PHENYLEPHRINE 40 MCG/ML (10ML) SYRINGE FOR IV PUSH (FOR BLOOD PRESSURE SUPPORT)
PREFILLED_SYRINGE | INTRAVENOUS | Status: AC
  Filled 2021-08-25: qty 10

## 2021-08-25 MED ORDER — ALBUTEROL SULFATE (2.5 MG/3ML) 0.083% IN NEBU
INHALATION_SOLUTION | RESPIRATORY_TRACT | Status: AC
  Filled 2021-08-25: qty 3

## 2021-08-25 MED ORDER — CIPROFLOXACIN IN D5W 400 MG/200ML IV SOLN
INTRAVENOUS | Status: DC | PRN
  Administered 2021-08-25: 400 mg via INTRAVENOUS

## 2021-08-25 MED ORDER — 0.9 % SODIUM CHLORIDE (POUR BTL) OPTIME
TOPICAL | Status: DC | PRN
  Administered 2021-08-25: 1000 mL

## 2021-08-25 MED ORDER — ONDANSETRON HCL 4 MG/2ML IJ SOLN
INTRAMUSCULAR | Status: DC | PRN
  Administered 2021-08-25: 4 mg via INTRAVENOUS

## 2021-08-25 MED ORDER — LIDOCAINE HCL (CARDIAC) PF 100 MG/5ML IV SOSY
PREFILLED_SYRINGE | INTRAVENOUS | Status: DC | PRN
  Administered 2021-08-25: 40 mg via INTRATRACHEAL

## 2021-08-25 MED ORDER — DROPERIDOL 2.5 MG/ML IJ SOLN: 0.6250 mg | Freq: Once | INTRAMUSCULAR | Status: DC | PRN

## 2021-08-25 MED ORDER — PROPOFOL 10 MG/ML IV BOLUS
INTRAVENOUS | Status: DC | PRN
  Administered 2021-08-25: 200 mg via INTRAVENOUS
  Administered 2021-08-25: 40 mg via INTRAVENOUS

## 2021-08-25 MED ORDER — ONDANSETRON HCL 4 MG/2ML IJ SOLN
INTRAMUSCULAR | Status: AC
  Filled 2021-08-25: qty 2

## 2021-08-25 MED ORDER — OXYCODONE HCL 5 MG/5ML PO SOLN
5.0000 mg | Freq: Once | ORAL | Status: DC | PRN
Start: 2021-08-25 — End: 2021-08-25

## 2021-08-25 MED ORDER — ARTIFICIAL TEARS OPHTHALMIC OINT
TOPICAL_OINTMENT | OPHTHALMIC | Status: AC
  Filled 2021-08-25: qty 3.5

## 2021-08-25 MED ORDER — FENTANYL CITRATE (PF) 250 MCG/5ML IJ SOLN
INTRAMUSCULAR | Status: AC
  Filled 2021-08-25: qty 5

## 2021-08-25 MED ORDER — OXYCODONE HCL 5 MG PO TABS: 5.0000 mg | ORAL_TABLET | Freq: Once | ORAL | Status: DC | PRN

## 2021-08-25 MED ORDER — ONDANSETRON HCL 4 MG/2ML IJ SOLN: 4.0000 mg | Freq: Four times a day (QID) | INTRAMUSCULAR | Status: DC | PRN

## 2021-08-25 MED ORDER — ALBUTEROL SULFATE (2.5 MG/3ML) 0.083% IN NEBU
2.5000 mg | INHALATION_SOLUTION | Freq: Once | RESPIRATORY_TRACT | Status: AC
  Administered 2021-08-25: 2.5 mg via RESPIRATORY_TRACT

## 2021-08-25 MED ORDER — PROPOFOL 10 MG/ML IV BOLUS
INTRAVENOUS | Status: AC
  Filled 2021-08-25: qty 40

## 2021-08-25 SURGICAL SUPPLY — 28 items
BAG COUNTER SPONGE SURGICOUNT (BAG) ×2 IMPLANT
BAG SPNG CNTER NS LX DISP (BAG) ×1
BALLN PULM 15 16.5 18X75 (BALLOONS)
BALLOON PULM 15 16.5 18X75 (BALLOONS) IMPLANT
CANISTER SUCT 3000ML PPV (MISCELLANEOUS) ×2 IMPLANT
CNTNR URN SCR LID CUP LEK RST (MISCELLANEOUS) IMPLANT
CONT SPEC 4OZ STRL OR WHT (MISCELLANEOUS)
COVER BACK TABLE 60X90IN (DRAPES) IMPLANT
COVER MAYO STAND STRL (DRAPES) ×2 IMPLANT
DRAPE HALF SHEET 40X57 (DRAPES) ×2 IMPLANT
GAUZE 4X4 16PLY ~~LOC~~+RFID DBL (SPONGE) ×2 IMPLANT
GLOVE SURG ENC TEXT LTX SZ7 (GLOVE) ×2 IMPLANT
GUARD TEETH (MISCELLANEOUS) IMPLANT
KIT BASIN OR (CUSTOM PROCEDURE TRAY) ×2 IMPLANT
KIT TURNOVER KIT B (KITS) ×2 IMPLANT
NEEDLE 18GX1X1/2 (RX/OR ONLY) (NEEDLE) IMPLANT
NEEDLE HYPO 25GX1X1/2 BEV (NEEDLE) IMPLANT
NS IRRIG 1000ML POUR BTL (IV SOLUTION) ×2 IMPLANT
PACK SURGICAL SETUP 50X90 (CUSTOM PROCEDURE TRAY) ×2 IMPLANT
PAD ARMBOARD 7.5X6 YLW CONV (MISCELLANEOUS) ×4 IMPLANT
PATTIES SURGICAL .5 X3 (DISPOSABLE) IMPLANT
SOL ANTI FOG 6CC (MISCELLANEOUS) IMPLANT
SOLUTION ANTI FOG 6CC (MISCELLANEOUS)
SOLUTION BUTLER CLEAR DIP (MISCELLANEOUS) IMPLANT
SURGILUBE 2OZ TUBE FLIPTOP (MISCELLANEOUS) ×2 IMPLANT
TOWEL GREEN STERILE FF (TOWEL DISPOSABLE) ×2 IMPLANT
TUBE CONNECTING 12X1/4 (SUCTIONS) ×2 IMPLANT
WATER STERILE IRR 1000ML POUR (IV SOLUTION) ×2 IMPLANT

## 2021-08-25 NOTE — Transfer of Care (Signed)
Immediate Anesthesia Transfer of Care Note  Patient: Tracy Gray  Procedure(s) Performed: DIRECT LARYNGOSCOPY AND ESOPHAGOSCOPY, REMOVAL OF FOREIGN BODY (Throat)  Patient Location: PACU  Anesthesia Type:General  Level of Consciousness: drowsy  Airway & Oxygen Therapy: Patient Spontanous Breathing and Patient connected to face mask oxygen  Post-op Assessment: Report given to RN and Post -op Vital signs reviewed and stable  Post vital signs: Reviewed and stable  Last Vitals:  Vitals Value Taken Time  BP 131/57 08/25/21 0249  Temp    Pulse 106 08/25/21 0254  Resp 31 08/25/21 0254  SpO2 96 % 08/25/21 0254  Vitals shown include unvalidated device data.  Last Pain:  Vitals:   08/24/21 2306  TempSrc:   PainSc: 2          Complications: No notable events documented.

## 2021-08-25 NOTE — Op Note (Signed)
Operative Note: DIRECT LARYNGOSCOPY/ESOPHAGOSCOPY  Patient: Tracy Gray record number: [id number]  Date:08/25/2021  Pre-operative Indications: Acute dysphagia  Postoperative Indications: Same  Surgical Procedure: Direct Laryngoscopy and Esophagoscopy  Anesthesia: GET  Surgeon: Delsa Bern, M.D.  Assist: None  Complications: None  EBL: None   Brief History: The patient is a 55 y.o. female with a history of choking when swallowing her evening meal.  The patient was eating chicken and developed acute stabbing sensation in the throat with nausea and vomiting. Given the patient's history and findings I recommended direct laryngoscopy, esophagoscopy and removal of possible foreign body under general anesthesia, risks and benefits were discussed in detail with the patient and her family. They understand and agree with our plan for surgery which is scheduled at emergency on an [facility] in OR basis.  Surgical Procedure: The patient is brought to the operating room on 08/25/2021 and placed in supine position on the operating table. General endotracheal anesthesia was established without difficulty. When the patient was adequately anesthetized, surgical timeout was performed and correct identification of the patient and the surgical procedure. The patient was positioned and prepped and draped in sterile fashion.  A laryngoscope was used to examine the patient's oral cavity, oropharynx and larynx.  Findings include small bleeding laceration in the right anterior vallecullum adjacent to the tongue base.  No foreign body identified and bleeding was limited.  No biopsy was taken.  There was no significant bleeding and the patient's airway was stable.  The hypopharynx and esophageal introitus were then thoroughly examined with a laryngoscope and there is no evidence of trauma or foreign body.  The cervical esophagoscope was then inserted without difficulty through the esophageal  introitus and advanced to its full length.  The esophagoscope was then withdrawn slowly to allow visualization of the esophageal mucosa.  Findings include no evidence of bleeding, trauma or foreign body.  An orogastric tube was passed and stomach contents were aspirated. Patient was awakened from anesthetic and transferred from the operating room to the recovery room in stable condition. There were no complications and blood loss was minimal.   Delsa Bern, M.D. [facility] 08/25/2021

## 2021-08-25 NOTE — Anesthesia Postprocedure Evaluation (Signed)
Anesthesia Post Note  Patient: Tracy Gray  Procedure(s) Performed: DIRECT LARYNGOSCOPY AND ESOPHAGOSCOPY, REMOVAL OF FOREIGN BODY (Throat)     Patient location during evaluation: PACU Anesthesia Type: General Level of consciousness: awake and alert Pain management: pain level controlled Vital Signs Assessment: post-procedure vital signs reviewed and stable Respiratory status: spontaneous breathing, nonlabored ventilation and respiratory function stable Cardiovascular status: blood pressure returned to baseline and stable Postop Assessment: no apparent nausea or vomiting Anesthetic complications: no   No notable events documented.  Last Vitals:  Vitals:   08/25/21 0001 08/25/21 0038  BP: 111/65 113/67  Pulse: 88 (!) 109  Resp: 18 20  Temp:    SpO2: 99% 93%    Last Pain:  Vitals:   08/24/21 2306  TempSrc:   PainSc: 2    Pain Goal:                   [name]

## 2021-08-25 NOTE — H&P (Signed)
Tracy Gray is an 55 y.o. female.   Chief Complaint: Difficulty swallowing HPI: Patient presents to the [facility] emergency department after choking while eating supper.  She was eating chicken and developed a sharp stabbing sensation in the throat.  She had coughing and regurgitation with some bloody discharge.  No prior history of significant dysphagia, obstruction or foreign body issues.  Past Medical History:  Diagnosis Date   Adenomatous colon polyp 03/26/2018   3 adenomas, max 49m   Allergy    Anxiety    Asthma    Chronic headache    COPD (chronic obstructive pulmonary disease) (HCC)    Degenerative disc disease    Fibromyalgia    GERD (gastroesophageal reflux disease)    Hyperlipidemia    Hypertension    Multiple sclerosis (HCC)    Nerve damage    OSA (obstructive sleep apnea)    not on CPAP    Sleep apnea     Past Surgical History:  Procedure Laterality Date   APPENDECTOMY     COLONOSCOPY     ECTOPIC PREGNANCY SURGERY     ESOPHAGEAL MANOMETRY N/A 09/07/2018   Procedure: ESOPHAGEAL MANOMETRY (EM);  Surgeon: NMauri Pole MD;  Location: [facility];  Service: Endoscopy;  Laterality: N/A;   ESOPHAGOGASTRODUODENOSCOPY     EXPLORATORY LAPAROTOMY     SINUS ENDO W/FUSION Bilateral 07/11/2021   Procedure: ENDOSCOPIC SINUS SURGERY WITH FUSION NAVIGATION;  Surgeon: SJerrell Belfast MD;  [facility]  Service: ENT;  Laterality: Bilateral;   SINUS IRRIGATION     TEMPOROMANDIBULAR JOINT SURGERY     TUBAL LIGATION     TURBINATE REDUCTION Bilateral 07/11/2021   Procedure: BILATERAL INFERIOR TURBINATE REDUCTION;  Surgeon: SJerrell Belfast MD;  [facility]  Service: ENT;  Laterality: Bilateral;    Family History  Problem Relation Age of Onset   Emphysema Mother        never smoked   Irritable bowel syndrome Mother    Hiatal hernia Mother    Other Mother        esophageal stricture   Emphysema Father         smoked   Prostate cancer Father    Breast cancer Paternal Aunt    Asthma Daughter    Asthma Son    Asthma Grandchild    Cirrhosis Maternal Uncle        alcohol related x 6 uncles   Esophageal cancer Neg Hx    Colon cancer Neg Hx    Pancreatic cancer Neg Hx    Stomach cancer Neg Hx    Social History:  reports that she quit smoking about 3 years ago. Her smoking use included cigarettes. She has a 21.00 pack-year smoking history. She has never used smokeless tobacco. She reports that she does not drink alcohol and does not use drugs.  Allergies:  Allergies  Allergen Reactions   Bee Venom Anaphylaxis   Penicillins Anaphylaxis   Pantoprazole Sodium Diarrhea    (Not in a hospital admission)   No results found for this or any previous visit (from the past 48 hour(s)). DG Neck Soft Tissue  Result Date: 08/24/2021 CLINICAL DATA:  Chicken bone in throat EXAM: NECK SOFT TISSUES - 1+ VIEW COMPARISON:  None. FINDINGS: Adenoidal and tonsillar shadows are within normal limits. The hypopharynx is not distended. Epiglottis and aryepiglottic folds are unremarkable. There are several small linear and rounded calcific densities noted within the posterior hypopharynx anterior to C3-4  which are indeterminate and may represent ossific foreign bodies given the patient's history. There is no prevertebral soft tissue swelling. The subglottic airway is widely patent. Minimal degenerative disc disease C5-6. IMPRESSION: Calcific densities within the hypopharynx possibly reflecting retained foreign bodies within the hypopharynx. This could be confirmed with noncontrast CT examination of the neck soft tissues or correlated with direct visualization. Electronically Signed   By: Fidela Salisbury M.D.   On: 08/24/2021 23:39    Review of Systems  HENT:  Positive for trouble swallowing.   Respiratory: Negative.    Cardiovascular: Negative.    Blood pressure 113/67, pulse (!) 109, temperature 98.8 F (37.1 C),  temperature source Oral, resp. rate 20, SpO2 93 %. Physical Exam Constitutional:      Appearance: She is normal weight.  HENT:     Mouth/Throat:     Mouth: Mucous membranes are moist.     Pharynx: Oropharynx is clear.  Cardiovascular:     Rate and Rhythm: Normal rate.  Pulmonary:     Effort: Pulmonary effort is normal.  Musculoskeletal:     Cervical back: Normal range of motion.  Neurological:     Mental Status: She is alert.     Assessment/Plan Patient admitted for outpatient direct laryngoscopy, cervical esophagoscopy and removal of foreign body.  Jerrell Belfast, MD 08/25/2021, 1:57 AM

## 2021-08-25 NOTE — ED Provider Notes (Signed)
[facility] EMERGENCY DEPARTMENT Provider Note   CSN: [id number] Arrival date & time: 08/24/21  2200     History Chief Complaint  Patient presents with   chicken bone in throat    Tracy Gray is a 55 y.o. female.  The history is provided by the patient.  Sore Throat This is a new problem. The problem occurs constantly. The problem has not changed since onset.Pertinent negatives include no chest pain. Associated symptoms comments: Denies any new shortness of breath. The symptoms are aggravated by swallowing. The symptoms are relieved by rest.  Patient presents with sore throat after swallowing chicken.  Patient reports she was eating chicken and rice and she suspects she got bone stuck in her throat.  She reports she is unable to swallow water.  No new chest pain or shortness of breath.  No severe neck pain.  It does hurt to swallow.    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   Dysphagia    [place] 07/16/[place]   Obstructive sleep apnea 05/10/2016   COPD  GOLD III with min reversiblity  03/01/2016   Sinusitis, chronic 03/01/2016   Cigarette smoker 02/03/2016   Upper airway cough syndrome 02/02/2016   Dyspnea 02/02/2016    Past Surgical History:  Procedure Laterality Date   APPENDECTOMY     COLONOSCOPY     ECTOPIC PREGNANCY SURGERY     ESOPHAGEAL  MANOMETRY N/A 09/07/2018   Procedure: ESOPHAGEAL MANOMETRY (EM);  Surgeon: NMauri Pole MD;  [facility];  Service: Endoscopy;  Laterality: N/A;   ESOPHAGOGASTRODUODENOSCOPY     EXPLORATORY LAPAROTOMY     SINUS ENDO W/FUSION Bilateral 07/11/2021   Procedure: ENDOSCOPIC SINUS SURGERY WITH FUSION NAVIGATION;  Surgeon: SJerrell Belfast MD;  [facility]  Service: ENT;  Laterality: Bilateral;   SINUS IRRIGATION     TEMPOROMANDIBULAR JOINT SURGERY     TUBAL LIGATION     TURBINATE REDUCTION Bilateral 07/11/2021   Procedure: BILATERAL INFERIOR TURBINATE REDUCTION;  Surgeon: SJerrell Belfast MD;  [facility]  Service: ENT;  Laterality: Bilateral;     OB History   No obstetric history on file.     Family History  Problem Relation Age of Onset   Emphysema Mother        never smoked   Irritable bowel syndrome Mother    Hiatal hernia Mother    Other Mother        esophageal stricture   Emphysema Father        smoked   Prostate cancer Father    Breast cancer Paternal A41   Asthma Daughter    Asthma Son    Asthma Grandchild    Cirrhosis Maternal Uncle  alcohol related x 6 uncles   Esophageal cancer Neg Hx    Colon cancer Neg Hx    Pancreatic cancer Neg Hx    Stomach cancer Neg Hx     Social History   Tobacco Use   Smoking status: Former    Packs/day: 0.50    Years: 42.00    Pack years: 21.00    Types: Cigarettes    Quit date: 06/03/2018    Years since quitting: 3.2   Smokeless tobacco: Never   Tobacco comments:       Vaping Use   Vaping Use: Former   Quit date: 12/23/2018  Substance Use Topics   Alcohol use: No    Alcohol/week: 0.0 standard drinks   Drug use: No    Home Medications Prior to Admission medications   Medication Sig Start Date End Date Taking? Authorizing Provider  acetaminophen (TYLENOL) 325 MG tablet Take 325 mg by mouth every 6 (six) hours as needed for moderate pain or  headache.   Yes [provider]  albuterol (PROVENTIL) (2.5 MG/3ML) 0.083% nebulizer solution Take 2.5 mg by nebulization every 4 (four) hours as needed for wheezing or shortness of breath. PLAN C   Yes [provider]  albuterol (VENTOLIN HFA) 108 (90 Base) MCG/ACT inhaler Inhale 2 puffs into the lungs every 4 (four) hours as needed for wheezing or shortness of breath. PLAN B   Yes [provider]  amLODipine (NORVASC) 10 MG tablet Take 10 mg by mouth daily.   Yes [provider]  Artificial Tear Ointment (DRY EYES OP) Apply 1 drop to eye daily as needed (dry eyes).   Yes [provider]  budesonide (PULMICORT) 0.25 MG/2ML nebulizer solution Take 0.25 mg by nebulization 2 (two) times daily.   Yes [provider]  clonazePAM (KLONOPIN) 1 MG tablet Take 2 mg by mouth at bedtime.   Yes [provider]  cloNIDine (CATAPRES) 0.1 MG tablet Take 0.1 mg by mouth at bedtime. 08/31/13  Yes [provider]  EPINEPHrine 0.3 mg/0.3 mL IJ SOAJ injection Inject 0.3 mg into the muscle as needed.    Yes [provider]  famotidine (PEPCID) 20 MG tablet TAKE 2 TABLETS BY MOUTH EVERY NIGHT AT BEDTIME Patient taking differently: Take 40 mg by mouth at bedtime. 06/15/21  Yes Gatha Mayer, MD  fluticasone (FLONASE) 50 MCG/ACT nasal spray Place 1 spray into both nostrils 3 (three) times daily. 07/24/21 07/24/22 Yes [provider]  HYDROcodone-acetaminophen (NORCO) 7.5-325 MG tablet Take 1 tablet by mouth every 6 (six) hours as needed for moderate pain.   Yes [provider]  ipratropium (ATROVENT) 0.02 % nebulizer solution Take 1 mg by nebulization 2 (two) times daily. 05/27/21  Yes [provider]  losartan (COZAAR) 50 MG tablet Take 50 mg by mouth daily.   Yes [provider]  montelukast (SINGULAIR) 10 MG tablet Take 10 mg by mouth at bedtime.   Yes [provider]  pantoprazole (PROTONIX) 20 MG  tablet Take 20 mg by mouth daily.   Yes [provider]  QUININE SULFATE PO Take 300 mg by mouth at bedtime.   Yes [provider]  Respiratory Therapy Supplies (FLUTTER) DEVI Use as directed 11/05/17  Yes Tanda Rockers, MD  rosuvastatin (CRESTOR) 20 MG tablet Take 20 mg by mouth daily.   Yes [provider]  SALINE NASAL SPRAY NA Place 1 spray into both nostrils as needed (to keep moisture in nose). 03/05/18  Yes [provider]  Tiotropium Bromide-Olodaterol (STIOLTO RESPIMAT) 2.5-2.5 MCG/ACT AERS Inhale 2 puffs into the lungs daily. 11/07/20  Yes Tanda Rockers, MD  famotidine (PEPCID) 40 MG tablet Take 1 tablet (40 mg total) by mouth at bedtime. Patient not taking: No sig reported 12/08/18   Gatha Mayer, MD  levofloxacin (LEVAQUIN) 500 MG tablet Take 1 tablet (500 mg total) by mouth daily. 1 tab po qd Patient not taking: No sig reported 07/11/21   Jerrell Belfast, MD    Allergies    Bee venom, Penicillins, and Pantoprazole sodium  Review of Systems   Review of Systems  Constitutional:  Negative for fever.  HENT:  Positive for sore throat and trouble swallowing. Negative for voice change.   Cardiovascular:  Negative for chest pain.  All other systems reviewed and are negative.  Physical Exam Updated Vital Signs BP 113/67   Pulse (!) 109   Temp 98.8 F (37.1 C) (Oral)   Resp 20   SpO2 93%   Physical Exam CONSTITUTIONAL: Well developed/well nourished HEAD: Normocephalic/atraumatic EYES: EOMI/PERRL ENMT: Mucous membranes moist, uvula midline, no foreign bodies noted in the mouth or pharynx.  No stridor, no drooling.  No dysphonia NECK: supple no meningeal signs, mild tenderness to anterior neck.  No crepitus, no edema CV: S1/S2 noted, no murmurs/rubs/gallops noted LUNGS: Lungs are clear to auscultation bilaterally, no apparent distress ABDOMEN: soft NEURO: Pt is awake/alert/appropriate, moves all extremitiesx4.  No facial droop.    EXTREMITIES: pulses normal/equal SKIN: warm, color normal PSYCH: no abnormalities of mood noted, alert and oriented to situation  ED Results / Procedures / Treatments   Labs (all labs ordered are listed, but only abnormal results are displayed) Labs Reviewed  RESP PANEL BY RT-PCR (FLU A&B, COVID) [id number]    EKG None  Radiology DG Neck Soft Tissue  Result Date: 08/24/2021 CLINICAL DATA:  Chicken bone in throat EXAM: NECK SOFT TISSUES - 1+ VIEW COMPARISON:  None. FINDINGS: Adenoidal and tonsillar shadows are within normal limits. The hypopharynx is not distended. Epiglottis and aryepiglottic folds are unremarkable. There are several small linear and rounded calcific densities noted within the posterior hypopharynx anterior to C3-4 which are indeterminate and may represent ossific foreign bodies given the patient's history. There is no prevertebral soft tissue swelling. The subglottic airway is widely patent. Minimal degenerative disc disease C5-6. IMPRESSION: Calcific densities within the hypopharynx possibly reflecting retained foreign bodies within the hypopharynx. This could be confirmed with noncontrast CT examination of the neck soft tissues or correlated with direct visualization. Electronically Signed   By: Fidela Salisbury M.D.   On: 08/24/2021 23:39    Procedures Procedures   Medications Ordered in ED Medications - No data to display  ED Course  I have reviewed the triage vital signs and the nursing notes.  Pertinent labs & imaging results that were available during my care of the patient were reviewed by me and considered in my medical decision making (see chart for details).    MDM Rules/Calculators/A&P                           I have consulted otolaryngology.  Patient is awake and alert handling her secretions and no stridor 12:44 AM Discussed with Dr. Wilburn Cornelia with otolaryngology.  He plans to take patient to the OR for direct visualization Patient updated on  plan Final Clinical Impression(s) / ED Diagnoses Final diagnoses:  Swallowed foreign body, initial encounter  Rx / DC Orders ED Discharge Orders     None        Ripley Fraise, MD 08/25/21 [id number]

## 2021-08-25 NOTE — Anesthesia Procedure Notes (Signed)
Procedure Name: Intubation Date/Time: 08/25/2021 2:21 AM Performed by: Clovis Cao, CRNA Pre-anesthesia Checklist: Patient identified, Emergency Drugs available, Suction available, Patient being monitored and Timeout performed Patient Re-evaluated:Patient Re-evaluated prior to induction Oxygen Delivery Method: Circle system utilized Preoxygenation: Pre-oxygenation with 100% oxygen Induction Type: IV induction, Rapid sequence and Cricoid Pressure applied Laryngoscope Size: Miller and 2 Grade View: Grade I Tube type: Oral Tube size: 7.0 mm Number of attempts: 1 Airway Equipment and Method: Stylet Placement Confirmation: ETT inserted through vocal cords under direct vision, positive ETCO2 and breath sounds checked- equal and bilateral Secured at: 21 cm Tube secured with: Tape Dental Injury: Teeth and Oropharynx as per pre-operative assessment

## 2021-08-25 NOTE — Anesthesia Preprocedure Evaluation (Signed)
Anesthesia Evaluation  Patient identified by MRN, date of birth, ID band Patient awake    Reviewed: Allergy & Precautions, NPO status , Patient's Chart, lab work & pertinent test results  Airway Mallampati: II  TM Distance: >3 FB Neck ROM: Full    Dental  (+) Dental Advisory Given, Edentulous Upper, Edentulous Lower   Pulmonary asthma , sleep apnea , COPD,  COPD inhaler, former smoker,    breath sounds clear to auscultation       Cardiovascular hypertension, Pt. on medications  Rhythm:Regular Rate:Normal     Neuro/Psych  Headaches, PSYCHIATRIC DISORDERS Anxiety    GI/Hepatic Neg liver ROS, GERD  ,Dysphagia Chicken bone stuck in throat   Endo/Other  negative endocrine ROS  Renal/GU negative Renal ROS     Musculoskeletal  (+) Arthritis , Fibromyalgia -  Abdominal   Peds  Hematology negative hematology ROS (+)   Anesthesia Other Findings   Reproductive/Obstetrics                             Anesthesia Physical  Anesthesia Plan  ASA: 3 and emergent  Anesthesia Plan: General   Post-op Pain Management:    Induction: Intravenous, Rapid sequence and Cricoid pressure planned  PONV Risk Score and Plan: 4 or greater and Ondansetron, Dexamethasone, Midazolam and Treatment may vary due to age or medical condition  Airway Management Planned: Oral ETT  Additional Equipment: None  Intra-op Plan:   Post-operative Plan: Extubation in OR  Informed Consent: I have reviewed the patients History and Physical, chart, labs and discussed the procedure including the risks, benefits and alternatives for the proposed anesthesia with the patient or authorized representative who has indicated his/her understanding and acceptance.     Dental advisory given  Plan Discussed with: CRNA and Anesthesiologist  Anesthesia Plan Comments:         Anesthesia Quick Evaluation

## 2021-08-26 ENCOUNTER — Encounter (HOSPITAL_COMMUNITY): Payer: Self-pay | Admitting: Otolaryngology

## 2021-08-29 ENCOUNTER — Telehealth: Payer: Self-pay | Admitting: Internal Medicine

## 2021-08-29 MED ORDER — PREDNISONE 10 MG PO TABS: ORAL_TABLET | ORAL | 0 refills | Status: DC

## 2021-08-29 NOTE — Telephone Encounter (Signed)
Called and spoke with patient. She verbalized understanding. I attempted to get her scheduled to see MW on 9/12 but she will be out of town until next Friday. I was able to get her scheduled to see Beth on 09/07/21 at 3pm since MW did not have any more appts until mid October.   She is aware to call us back if anything changes.   Nothing further needed at time of call.

## 2021-08-29 NOTE — Telephone Encounter (Signed)
Pt states she had emergency throat surgery Friday night @ [facility]. Swallowed a chicken bone. States the nurse said she had a full blown asthma attack. Had to use inhaler before and after procedure. Pt states she is still having asthma attacks. Was advised to call pulmonary and inform Dr. Melvyn Novas.  Pharm: [facility] on [address]

## 2021-08-29 NOTE — Telephone Encounter (Signed)
Primary Pulmonologist: MW Last office visit and with whom: 04/17/21 with MW What do we see them for (pulmonary problems): COPD Last OV assessment/plan: Quit smoking  Sept 2016  - PFT's  04/11/2016  FEV1 1.32 (47 % ) ratio 60  p 13 % improvement from saba p advair prior to study with DLCO  42/42 % corrects to 66 % for alv volume   - 11/05/2017   change advair to symbicort 160 2bid  - 03/20/2018   try symb 80 2bid due to prominent cough with choking (see uacs) PFT's  05/01/2018  FEV1 1.12 (41 % ) ratio 58  p 0 % improvement from saba p nothing prior to study with DLCO  42 % corrects to 64  % for alv volume    - alpha one AT screen 05/01/2018 ;  MM level 171    -  07/21/2019  After extensive coaching inhaler device,  effectiveness =  90% from baseline 75% > continue symb 80 2bid as higher doses tend to irritate her upper airway.   Doing much better since she stopped smoking completely one year prior to OV  .  I strongly reinforced this message.    02/07/2020 - Trial changing Symbicort to Stiolto + budesonide nebulizer Insurance not covering Sym and patient does not feel it is working and reports thrush.    02/21/2020 Patient reports 100% improvement. Continue Stiolto and Budesonide nebulizer BID     Group D in terms of symptom/risk and laba/lama/ICS  therefore appropriate rx at this point >>>  stiolto /budesonide rx optimal > f/u yearly    In meantime: Low-dose CT lung cancer screening is recommended for patients who are 4-58 years of age with a 30+ pack-year history of smoking, and who are currently smoking or quit <=15 years ago.     >>> she is now eligible so referred for shared decision making              Each maintenance medication was reviewed in detail including emphasizing most importantly the difference between maintenance and prns and under what circumstances the prns are to be triggered using an action plan format where appropriate.   Total time for H and P, chart review,  counseling, reviewing smi/hfa/neb device(s) and generating customized AVS unique to this office visit / same day charting > 30 min        Was appointment offered to patient (explain)?  Patient wanted recommendations first    Reason for call: Called and spoke with patient. She stated that on Friday she was eating chicken and rice and accidentally swallowed a chicken bone. She went to the ED and had to have emergency surgery in order to have the bone removed. She stated that the anesthesiologist asked her before the surgery if she had a history of asthma because she appeared to be having an asthma attack. The anesthesiologist gave her 2 puffs of albuterol before the procedure and 2 more puffs after the procedure. She also recommended that she follow up MW in regards to her asthma attacks.   Since she has been home, she has been using her albuterol every 4 hours for the SOB and wheezing episodes. She is still using the Stiolto, Pulmicort nebs and Duonebs. Denied any fevers, coughing or body aches.   She also mentioned that she had sinus surgery that was performed by Dr. Wilburn Cornelia last month and she has been able to smell for the first time in 3 years. He did warn her that  her breathing may get worse due to the different smells. He prescribed Allergra for her.   (examples of things to ask: : When did symptoms start? Fever? Cough? Productive? Color to sputum? More sputum than usual? Wheezing? Have you needed increased oxygen? Are you taking your respiratory medications? What over the counter measures have you tried?)  Allergies  Allergen Reactions   Bee Venom Anaphylaxis   Penicillins Anaphylaxis   Pantoprazole Sodium Diarrhea    Immunization History  Administered Date(s) Administered   Fluad Quad(high Dose 65+) 10/06/2020   Influenza Inj Mdck Quad Pf 10/07/2019   Influenza Split 09/23/2015, 09/22/2017   Influenza-Unspecified 05/19/2017   Moderna Sars-Covid-2 Vaccination 04/21/2020, 05/19/2020,  12/28/2020   Pneumococcal-Unspecified 09/23/2015   Tdap 04/25/2013    Pharmacy is [facility] on [address].   Beth, can you please advise since MW is not here today? Thanks!

## 2021-08-29 NOTE — Telephone Encounter (Signed)
I will send in prednisone '20mg'$  x 5days. Please schedule follow-up visit with Dr. Melvyn Novas first available

## 2021-09-03 ENCOUNTER — Ambulatory Visit: Payer: Medicare Other

## 2021-09-03 ENCOUNTER — Ambulatory Visit (INDEPENDENT_AMBULATORY_CARE_PROVIDER_SITE_OTHER): Payer: Medicare Other | Admitting: Internal Medicine

## 2021-09-03 ENCOUNTER — Other Ambulatory Visit: Payer: Self-pay

## 2021-09-03 ENCOUNTER — Encounter: Payer: Self-pay | Admitting: Internal Medicine

## 2021-09-03 DIAGNOSIS — R058 Other specified cough: Secondary | ICD-10-CM

## 2021-09-03 DIAGNOSIS — J449 Chronic obstructive pulmonary disease, unspecified: Secondary | ICD-10-CM | POA: Diagnosis not present

## 2021-09-03 MED ORDER — LEVOFLOXACIN 500 MG PO TABS: 500.0000 mg | ORAL_TABLET | Freq: Every day | ORAL | 0 refills | Status: DC

## 2021-09-03 MED ORDER — PREDNISONE 10 MG PO TABS: ORAL_TABLET | ORAL | 0 refills | Status: DC

## 2021-09-03 NOTE — Patient Instructions (Addendum)
Levaquin 500 mg daily  x 7 days  Prednisone 10 mg take  4 each am x 2 days,   2 each am x 2 days,  1 each am x 2 days and stop    Plan A = Automatic = Always=   stiolto 2 puffs each am   Work on inhaler technique:  relax and gently blow all the way out then take a nice smooth full deep breath back in, triggering the inhaler at same time you start breathing in.  Hold for up to 5 seconds if you can.   Rinse and gargle with water when done.  If mouth or throat bother you at all,  try brushing teeth/gums/tongue with arm and hammer toothpaste/ make a slurry and gargle and spit out.      Plan B = Backup (to supplement plan A, not to replace it) Only use your albuterol inhaler as a rescue medication to be used if you can't catch your breath by resting or doing a relaxed purse lip breathing pattern.  - The less you use it, the better it will work when you need it. - Ok to use the inhaler up to 2 puffs  every 4 hours if you must but call for appointment if use goes up over your usual need - Don't leave home without it !!  (think of it like the spare tire for your car)   Plan C = Crisis (instead of Plan B but only if Plan B stops working) - only use your albuterol nebulizer if you first try Plan B and it fails to help > ok to use the nebulizer up to every 4 hours but if start needing it regularly call for immediate appointment  For cough >  delsym 2 tsp every 12 hours and supplement with norco one every 4 hours as needed and change Protonix=   double it to 40 mg  Take 30-60 min before first meal of the day and pepcid 20 mg after supper until stop coughing and no longer need any cough medicine for a week then ok to resume prior protonix instructions from Dr Carlean Purl   Please remember to go to the  x-ray department  for your tests - we will call you with the results when they are available     Keep prior appt - call sooner if needed

## 2021-09-03 NOTE — Assessment & Plan Note (Signed)
Sinus CT 02/06/2016 > Maxillary sinusitis> rx levaquin 500  daily x 10 days> min improvement 03/01/2016 > referred to ENT > [facility] Allergy profile 03/01/2016 >  Eos 0.1 /  IgE  8 neg RAST - sinus surgery 07/11/21 ([facility]) then asp bone 08/24/21 > flare of cough   Of the three most common causes of  Sub-acute / recurrent or chronic cough, only one (GERD)  can actually contribute to/ trigger  the other two (asthma and post nasal drip syndrome)  and perpetuate the cylce of cough.  While not intuitively obvious, many patients with chronic low grade reflux do not cough until there is a primary insult that disturbs the protective epithelial barrier and exposes sensitive nerve endings.   This is typically viral but can due to PNDS and  either may apply here.    >>>  The point is that once this occurs, it is difficult to eliminate the cycle  using anything but a maximally effective acid suppression regimen at least in the short run, accompanied by an appropriate diet to address non acid GERD and control / eliminate the cough itself for at least 3 days with hydrocodone >>> also so added 6 day taper off  Prednisone starting at 40 mg per day in case of component of Th-2 driven upper or lower airways inflammation (if cough responds short term only to relapse before return while will on full rx for uacs (as above), then  that would point to allergic rhinitis/ asthma or eos bronchitis as alternative dx)          Each maintenance medication was reviewed in detail including emphasizing most importantly the difference between maintenance and prns and under what circumstances the prns are to be triggered using an action plan format where appropriate.  Total time for H and P, chart review, counseling, reviewing hfa and smi device(s) and generating customized AVS unique to this acute office visit / same day charting = > 30 min

## 2021-09-03 NOTE — Progress Notes (Signed)
Subjective:   Patient ID: Tracy Gray, female    DOB: 11-21-1966     MRN: [id number]  Brief patient profile:  71 yowf  MM  quit smoking 05/2018  "sick"  since September 2016 with ear pain, nasal congestion, cough and sob rx per Dr Janeice Robinson with multiple abx and prednisone but no better then to ER > still only  some better  referred to pulmonary clinic 02/02/2016 for copd eval by EDP  Proved to have GOLD III criteria 03/2016     History of Present Illness  02/02/2016 1st [facility] office visit/ Tracy Gray   Chief Complaint  Patient presents with   Pulmonary Consult    Referred by [facility] after ED visit on 12/17/16. Pt states that she was dxed with COPD "years ago"- c/o increased SOB and cough for the past few months.  Cough is prod with clear to green sputum. She is SOB with or without any exertion. She is using albuterol 4 x per day on average.   symptoms came on abruptly p "virus" in Sept 2016  and consistent of variable sob are worse after supper assoc with choking sensation / sore throat and overt HB and persistent Doe x car to house with groceries every time. Cough is worse in am's, minimally productive, only occ green now. rec   sinus ct > pos sinusitis > levaquin x 10   Pantoprazole (protonix) 40 mg   Take  30-60 min before first meal of the day and Zantac  @  bedtime until return to office -  GERD  Add: did not go to lab/ ok to complete when returns       04/15/2016  f/u ov/Tracy Gray re: GOLD III/ still smoking/ changed to  advair 115 due  To insurance  Chief Complaint  Patient presents with   Follow-up    PFT done 04/11/16. She c/o "feels like I don't have any air" x 3-4 days. She states that she also has "butterfly in my chest".  She is using albuterol inhaler 3 x daily on average and neb with atrovent  3 x per wk on average.   now on chantix / duoneb rarely and planning to regroup Dr  Wilburn Cornelia 4/25 Feels worse off symbicort and on advair  rec Plan A = Automatic =  Symbicort 160 Take 2 puffs first thing in am and then another 2 puffs about 12 hours later.  Plan B = Backup Only use your albuterol as a rescue medication  Plan C = Crisis - only use your albuterol/ipatropium  nebulizer if you first try Plan B and it fails to help > ok to use the nebulizer up to every 4 hours but if start needing it regularly call for immediate appointment The key is to stop smoking completely before smoking completely stops you - it's the most important aspect of your care     08/03/2018  f/u ov/Tracy Gray re:  GOLD III/ no smoking since 05/2018 / maint on  Chief Complaint  Patient presents with   Follow-up    Pt states she has had some episodes where she has felt like she has been choking while swallowing. Pt is going to have a barium swallow to see if anything is shown with that. Pt is two and a half months smoking free.  Dyspnea:  MMRC2 = can't walk a nl pace on a flat grade s sob but does fine slow and flat eg 30 min at mall  Cough: just  assoc with eating"like choking" w/u in progress  Sleeping: flat ok but restless SABA use: when over does it  rec Work on hfa    02/03/2019  f/u ov/Tracy Gray re: copd gold III / still not smoking  / lingering wheeze/ cough p uri maint on symb 80 2bid  Chief Complaint  Patient presents with   Follow-up    Pt c/o cough with clear sputum and wheezing since had fluy in Dec 2019. She is using her proair 3-4 x per day on average and neb about 2 x per day.  Dyspnea:  Able to do [employer] walking slow pace = MMRC2 = can't walk a nl pace on a flat grade s sob but does fine slow and flat  Cough: with ex mostly but also some in am and not using mucinex or flutter as rec  Sleeping: on side at 10 degrees with bed blocks  SABA use: too much as noted mostly when "over does it" - never rechallenges p saba to see if ex tol improves 02: no  rec Prednisone 10 mg take  4 each am x 2 days,   2 each am x 2 days,  1 each am x 2 days and stop  For cough > mucinex  dm up to 1200 mg every 12 hours and use the flutter valve as much as possible Plan A = Automatic =  symbicort 160 Take 2 puffs first thing in am and then another 2 puffs about 12 hours later.  Work on inhaler technique:  Plan B = Backup Only use your albuterol as a rescue medication  Plan C = Crisis - only use your albuterol nebulizer if you first try Plan B and it fails to help > ok to use the nebulizer up to every 4 hours but if start needing it regularly call for immediate appointment     07/21/2019  f/u ov/Tracy Gray re:  GOLD II/ symb 80 with suboptimal technique Chief Complaint  Patient presents with   Follow-up    Breathing is overall doing well. She is having some sinus congestion and cough in the am with clear sputum. She is using her albuterol inhaler about 2 x per day on average and neb a few times per month.   Dyspnea:  [employer] ok/ problem is waking out in heat  Cough: minimal mucoid, mostly in am  Sleeping: on side / bed blocks effective  SABA use: as above, takes first thing in am  02: none  Still overt hb despite ppi and h2hs rec  Plan A = Automatic =  symbicort 80 Take 2 puffs first thing in am and then another 2 puffs about 12 hours later.  Work on inhaler technique:   Plan B = Backup Only use your albuterol as a rescue medication Plan C = Crisis - only use your albuterol nebulizer if you first try Plan B and it fails to help > ok to use the nebulizer up to every 4 hours but if start needing it regularly call for immediate appointment    04/17/2021  f/u ov/Tracy Gray re:  GOLD II/ symb maint stiolto 2x each am and bud bid neb  Chief Complaint  Patient presents with   Follow-up    Sob-same on exertion, pnd, cough occass.,sinus infection 2 mths.being treated  Dyspnea:  MMRC1 = can walk nl pace, flat grade, can't hurry or go uphills or steps s sob   Cough: some with pnds rx clindamycin Sleeping: bed blocks/ one pillow  SABA use:  increase albuterol to 6 x week y with sinus  flare 02: none  Covid status:   vax x 3 moderna Rec No change in medications  Please schedule a televist with Eric Form NP to do shared decision making regarding screening CT scan program  Please schedule a follow up visit in 12  months but call sooner if needed    09/03/2021  acute ov/Tracy Gray re: copd gold 2    maint on stiolto and saba prn last dose 6 h prior to [place]   Chief Complaint  Patient presents with   Acute Visit    She had sinus surgery 3 wks ago, and then surgery again to remove chicken bone from her throat 08/25/21.  She states she feels more SOB lately, esp when exposed to strong smells. She is using her albuterol inhaler 3-5 x per day. She has been coughing up green sputum x 3 days.   Dyspnea: walk 50 ft then sob but better than prior to sinus surgery  Cough:  worse than usual since sinus surgery / no abx > green x 3 days  Sleeping: almost having to sit straight up since sinus surgery or severe cough SABA use: saba hfa x 2 this am  02: none  Covid status:   vax x 3    No obvious day to day or daytime variability or assoc  mucus plugs or hemoptysis or cp or chest tightness, subjective wheeze or overt sinus or hb symptoms.      Also denies any obvious fluctuation of symptoms with weather or environmental changes or other aggravating or alleviating factors except as outlined above   No unusual exposure hx or h/o childhood pna/ asthma or knowledge of premature birth.  Current Allergies, Complete Past Medical History, Past Surgical History, Family History, and Social History were reviewed in [employer] record.  ROS  The following are not active complaints unless bolded Hoarseness, sore throat, dysphagia, dental problems, itching, sneezing,  nasal congestion or discharge of excess mucus or purulent secretions, ear ache,   fever, chills, sweats, unintended wt loss or wt gain, classically pleuritic or exertional cp,  orthopnea pnd or arm/hand swelling  or leg  swelling, presyncope, palpitations, abdominal pain, anorexia, nausea, vomiting, diarrhea  or change in bowel habits or change in bladder habits, change in stools or change in urine, dysuria, hematuria,  rash, arthralgias, visual complaints, headache, numbness, weakness or ataxia or problems with walking or coordination,  change in mood or  memory.        Current Meds  Medication Sig   acetaminophen (TYLENOL) 325 MG tablet Take 325 mg by mouth every 6 (six) hours as needed for moderate pain or headache.   albuterol (PROVENTIL) (2.5 MG/3ML) 0.083% nebulizer solution Take 2.5 mg by nebulization every 4 (four) hours as needed for wheezing or shortness of breath. PLAN C   albuterol (VENTOLIN HFA) 108 (90 Base) MCG/ACT inhaler Inhale 2 puffs into the lungs every 4 (four) hours as needed for wheezing or shortness of breath. PLAN B   amLODipine (NORVASC) 10 MG tablet Take 10 mg by mouth daily.   Artificial Tear Ointment (DRY EYES OP) Apply 1 drop to eye daily as needed (dry eyes).   clonazePAM (KLONOPIN) 1 MG tablet Take 2 mg by mouth at bedtime.   cloNIDine (CATAPRES) 0.1 MG tablet Take 0.1 mg by mouth at bedtime.   EPINEPHrine 0.3 mg/0.3 mL IJ SOAJ injection Inject 0.3 mg into the muscle as needed.  famotidine (PEPCID) 20 MG tablet TAKE 2 TABLETS BY MOUTH EVERY NIGHT AT BEDTIME (Patient taking differently: Take 40 mg by mouth at bedtime.)   fluticasone (FLONASE) 50 MCG/ACT nasal spray Place 1 spray into both nostrils 3 (three) times daily.   HYDROcodone-acetaminophen (NORCO) 7.5-325 MG tablet Take 1 tablet by mouth every 6 (six) hours as needed for moderate pain.   losartan (COZAAR) 50 MG tablet Take 50 mg by mouth daily.   montelukast (SINGULAIR) 10 MG tablet Take 10 mg by mouth at bedtime.   pantoprazole (PROTONIX) 20 MG tablet Take 20 mg by mouth daily.   QUININE SULFATE PO Take 300 mg by mouth at bedtime.   Respiratory Therapy Supplies (FLUTTER) DEVI Use as directed   rosuvastatin (CRESTOR) 20  MG tablet Take 20 mg by mouth daily.   SALINE NASAL SPRAY NA Place 1 spray into both nostrils as needed (to keep moisture in nose).   Tiotropium Bromide-Olodaterol (STIOLTO RESPIMAT) 2.5-2.5 MCG/ACT AERS Inhale 2 puffs into the lungs daily.   [DISCONTINUED] budesonide (PULMICORT) 0.25 MG/2ML nebulizer solution Take 0.25 mg by nebulization 2 (two) times daily.   [DISCONTINUED] ipratropium (ATROVENT) 0.02 % nebulizer solution Take 1 mg by nebulization 2 (two) times daily.                 Objective:   Physical Exam    09/03/2021        159  04/17/2021        151  07/21/2019        160  02/03/2019        163  08/03/2018        163  05/01/2018       167  03/20/2018        168  11/05/2017     167  07/15/2016       152   04/15/2016      159   03/01/16 157 lb (71.215 kg)  02/02/16 157 lb (71.215 kg)  10/09/12 142 lb (64.411 kg)      Vital signs reviewed  09/03/2021  - Note at rest 02 sats  94% on RA   General appearance:    amb / not acutely ill at all  / harsh upper airway cough but clear voice     HEENT : pt wearing mask not removed for exam due to covid - 19 concerns.    NECK :  without JVD/Nodes/TM/ nl carotid upstrokes bilaterally   LUNGS: no acc muscle use,  Mild barrel  contour chest wall with bilateral  Distant bs s audible wheeze and  without cough on insp or exp maneuvers  and mild  Hyperresonant  to  percussion bilaterally     CV:  RRR  no s3 or murmur or increase in P2, and no edema   ABD:  soft and nontender with pos end  insp Hoover's  in the supine position. No bruits or organomegaly appreciated, bowel sounds nl  MS:   Nl gait/  ext warm without deformities, calf tenderness, cyanosis or clubbing No obvious joint restrictions   SKIN: warm and dry without lesions    NEURO:  alert, approp, nl sensorium with  no motor or cerebellar deficits apparent.        CXR PA and Lateral:   09/03/2021 :    I personally reviewed images and agree with radiology impression as  follows:    Marland Kitchen   Did not go for cxr as requested     Assessment &  Plan:

## 2021-09-03 NOTE — Assessment & Plan Note (Addendum)
Quit smoking  Sept 2016  - PFT's  04/11/2016  FEV1 1.32 (47 % ) ratio 60  p 13 % improvement from saba p advair prior to study with DLCO  42/42 % corrects to 66 % for alv volume   - 11/05/2017   change advair to symbicort 160 2bid  - 03/20/2018   try symb 80 2bid due to prominent cough with choking (see uacs) PFT's  05/01/2018  FEV1 1.12 (41 % ) ratio 58  p 0 % improvement from saba p nothing prior to study with DLCO  42 % corrects to 64  % for alv volume    - alpha one AT screen 05/01/2018 ;  MM  level 171  02/07/2020 - Trial changing Symbicort to Stiolto + budesonide nebulizer Insurance not covering Sym and patient does not feel it is working and reports thrush.  02/21/2020 Patient reports 100% improvement. Continue Stiolto and Budesonide nebulizer BID  - 09/03/2021 flare off budesonide in setting of sinus surgery then bone aspiration 08/24/21 > rx pred x 6 continue stiolto but not using budesonide so leave off as most of the problem is upper airway in nature    The proper method of use, as well as anticipated side effects, of a metered-dose inhaler(mdi and smi) were discussed and demonstrated to the patient using teach back method. Improved effectiveness after extensive coaching during this visit to a level of approximately 75 % from a baseline  I actually believe now that upper airway concerns are being addressed and not smoking wll stabilize out to result in  Group B symptoms and risk > continue stiolto until next ov and rx UACS (see separate a/p)

## 2021-09-07 ENCOUNTER — Ambulatory Visit: Payer: Medicare Other | Admitting: Primary Care

## 2021-11-17 ENCOUNTER — Other Ambulatory Visit: Payer: Self-pay | Admitting: Primary Care

## 2022-01-04 ENCOUNTER — Other Ambulatory Visit: Payer: Self-pay | Admitting: Internal Medicine

## 2022-01-07 ENCOUNTER — Telehealth: Payer: Self-pay | Admitting: Internal Medicine

## 2022-01-07 NOTE — Telephone Encounter (Signed)
Spoke with the pt  She states needing refill on her budesonide neb sol  I do not see this on her current med list  I see that Eustaquio Maize put her on this in Oct 2021 but then she stopped and MW said to leave off  She states she doesn't recall ever stopping med  Please advise if okay to refill  She states only using her stiolto daily and albuterol hfa maybe 2 x per wk

## 2022-01-08 MED ORDER — BUDESONIDE 0.5 MG/2ML IN SUSP: 0.5000 mg | Freq: Two times a day (BID) | RESPIRATORY_TRACT | 12 refills | Status: DC

## 2022-01-08 NOTE — Telephone Encounter (Signed)
Ok to continue budsesonide/ refill it

## 2022-01-08 NOTE — Telephone Encounter (Signed)
Placed Budsesonibe refill order. Notified pt and verified pharmacy. Nothing further needed at this time.

## 2022-01-31 ENCOUNTER — Other Ambulatory Visit: Payer: Self-pay | Admitting: Oral Surgery

## 2022-01-31 DIAGNOSIS — M26659 Arthropathy of unspecified temporomandibular joint: Secondary | ICD-10-CM

## 2022-01-31 NOTE — Progress Notes (Signed)
January 31, 2022  Tracy Gray, Tracy Gray  is a 56 yo who was referred by  Jerrell Belfast, MD  for TMJ evaluation.   CC: Jaw locking, grinding noise.  HPI: Pt had TMJ sx by me 75 y ago. Doing well until about 5 y ago when she started having joint noise on right, clicking and popping, and occasional locking. Had Sinus surgery 3 months ago and had closed lock for 3 weeks after surgery. Has worn bite splint without relief.   Past Medical History: COPD, Asthma, Hay Fever or Sinus Problems, Sleep Apnea, Emphysema, Osteoporosis, Dry Eye, full dentures, Pain and Clicking of Jaw  Medications: Quinine, Lorcet, Rovustatin, Pulmacort, Proventil   Allergies: Penicillin, bees  Exam:   Edentulous maxilla and mandible. Bilateral clicking of the TMJ. The maximum opening was 50 mm edentulous), with normal range of motion. The muscles of mastication were tender to palpation on the right. There was crepitus present on the right. Oral cancer screening negative. Pharynx clear. no lymphadenopathy  Pan: Condylar anatomy with some irregularity. Possible bone spurs in the right joint space.   Assessment: ASA  3. Right and left TMJ arthropathy.   Plan: MRI. Treatment depends on results.     Rx: None  Gae Bon, DMD

## 2022-02-12 ENCOUNTER — Ambulatory Visit (HOSPITAL_COMMUNITY)
Admission: RE | Admit: 2022-02-12 | Discharge: 2022-02-12 | Disposition: A | Discharge status: Home / Self Care | Payer: Medicare Other | Source: Ambulatory Visit | Attending: Oral Surgery | Admitting: Oral Surgery

## 2022-02-12 ENCOUNTER — Inpatient Hospital Stay (HOSPITAL_COMMUNITY): Admission: RE | Admit: 2022-02-12 | Payer: Medicare Other | Source: Ambulatory Visit

## 2022-02-12 DIAGNOSIS — M26659 Arthropathy of unspecified temporomandibular joint: Secondary | ICD-10-CM | POA: Diagnosis not present

## 2022-04-17 ENCOUNTER — Ambulatory Visit: Payer: Medicare Other | Admitting: Internal Medicine

## 2022-04-23 ENCOUNTER — Ambulatory Visit: Payer: Medicare Other | Admitting: Internal Medicine

## 2022-05-13 ENCOUNTER — Encounter: Payer: Self-pay | Admitting: Internal Medicine

## 2022-05-13 ENCOUNTER — Ambulatory Visit (INDEPENDENT_AMBULATORY_CARE_PROVIDER_SITE_OTHER): Payer: Medicare Other | Admitting: Internal Medicine

## 2022-05-13 DIAGNOSIS — J449 Chronic obstructive pulmonary disease, unspecified: Secondary | ICD-10-CM | POA: Diagnosis not present

## 2022-05-13 DIAGNOSIS — Z87891 Personal history of nicotine dependence: Secondary | ICD-10-CM | POA: Diagnosis not present

## 2022-05-13 MED ORDER — BUDESONIDE-FORMOTEROL FUMARATE 160-4.5 MCG/ACT IN AERO: INHALATION_SPRAY | RESPIRATORY_TRACT | 12 refills | Status: DC

## 2022-05-13 MED ORDER — PREDNISONE 10 MG PO TABS: ORAL_TABLET | ORAL | 0 refills | Status: DC

## 2022-05-13 NOTE — Patient Instructions (Addendum)
Prednisone 10 mg take  4 each am x 2 days,   2 each am x 2 days,  1 each am x 2 days and stop    Plan A = Automatic = Always=   stop stiolto and budesonide for now and try  Symbicort 160 Take 2 puffs first thing in am and then another 2 puffs about 12 hours later.     Plan B = Backup (to supplement plan A, not to replace it) Only use your albuterol inhaler as a rescue medication to be used if you can't catch your breath by resting or doing a relaxed purse lip breathing pattern.  - The less you use it, the better it will work when you need it. - Ok to use the inhaler up to 2 puffs  every 4 hours if you must but call for appointment if use goes up over your usual need - Don't leave home without it !!  (think of it like the spare tire for your car)   Plan C = Crisis (instead of Plan B but only if Plan B stops working) - only use your albuterol nebulizer if you first try Plan B and it fails to help > ok to use the nebulizer up to every 4 hours but if start needing it regularly call for immediate appointment    Please schedule a follow up visit in 6  months but call sooner if needed

## 2022-05-13 NOTE — Assessment & Plan Note (Addendum)
Quit smoking 2019 - referred for lung cancer screening 05/13/2022   Low-dose CT lung cancer screening is recommended for patients who are 2-57 years of age with a 20+ pack-year history of smoking and who are currently smoking or quit <=15 years ago. No coughing up blood  No unintentional weight loss of > 15 pounds in the last 6 months - pt is eligible for scanning yearly until 2034 > referred      Each maintenance medication was reviewed in detail including emphasizing most importantly the difference between maintenance and prns and under what circumstances the prns are to be triggered using an action plan format where appropriate.  Total time for H and P, chart review, counseling, reviewing hfa  device(s) and generating customized AVS unique to this office visit / same day charting  >   30 min

## 2022-05-13 NOTE — Assessment & Plan Note (Signed)
Quit smoking  Sept 2016  - PFT's  04/11/2016  FEV1 1.32 (47 % ) ratio 60  p 13 % improvement from saba p advair prior to study with DLCO  42/42 % corrects to 66 % for alv volume   - 11/05/2017   change advair to symbicort 160 2bid  - 03/20/2018   try symb 80 2bid due to prominent cough with choking (see uacs) PFT's  05/01/2018  FEV1 1.12 (41 % ) ratio 58  p 0 % improvement from saba p nothing prior to study with DLCO  42 % corrects to 64  % for alv volume    - alpha one AT screen 05/01/2018 ;  MM  level 171  02/07/2020 - Trial changing Symbicort to Stiolto + budesonide nebulizer Insurance not covering Sym and patient does not feel it is working and reports thrush.  02/21/2020 Patient reports 100% improvement. Continue Stiolto and Budesonide nebulizer BID  - 09/03/2021 flare off budesonide in setting of sinus surgery then bone aspiration 08/24/21 > rx pred x 6 continue stiolto but not using budesonide so leave off as most of the problem is upper airway in nature  - - The proper method of use, as well as anticipated side effects, of a metered-dose inhaler(mdi and smi) were discussed and demonstrated to the patient using teach back method. Improved effectiveness after extensive coaching during this visit to a level of approximately 75 % from a baseline of 50%   - 05/13/2022  After extensive coaching inhaler device,  effectiveness =    90% > changed to symbicort 160   for flare with cat exp  Was doing great until indoor animal exp so plans to get them out by 05/18/22 and in meantime consolidate rx with symb 160 2bid with low threshold to change to breztri if worse breathing, symb 80 if worse cough p change rx plus Prednisone 10 mg take  4 each am x 2 days,   2 each am x 2 days,  1 each am x 2 days and stop  F/u in 6 mo - call sooner prn

## 2022-05-13 NOTE — Progress Notes (Signed)
Subjective:   Patient ID: Tracy Gray, female    DOB: [birth year]     MRN: [id number]  Brief patient profile:  44 yowf  MM  quit smoking 05/2018  "sick"  since September 2016 with ear pain, nasal congestion, cough and sob rx per Dr Janeice Robinson with multiple abx and prednisone but no better then to ER > still only  some better  referred to pulmonary clinic 02/02/2016 for copd eval by EDP  Proved to have GOLD III criteria 03/2016     History of Present Illness  02/02/2016 1st [facility] office visit/ Mahkayla Preece   Chief Complaint  Patient presents with   Pulmonary Consult    Referred by [facility] after ED visit on 12/17/16. Pt states that she was dxed with COPD "years ago"- c/o increased SOB and cough for the past few months.  Cough is prod with clear to green sputum. She is SOB with or without any exertion. She is using albuterol 4 x per day on average.   symptoms came on abruptly p "virus" in Sept 2016  and consistent of variable sob are worse after supper assoc with choking sensation / sore throat and overt HB and persistent Doe x car to house with groceries every time. Cough is worse in am's, minimally productive, only occ green now. rec   sinus ct > pos sinusitis > levaquin x 10   Pantoprazole (protonix) 40 mg   Take  30-60 min before first meal of the day and Zantac  @  bedtime until return to office -  GERD  Add: did not go to lab/ ok to complete when returns   Sinus surgery 07/11/21   09/03/2021  acute ov/Emmajane Altamura re: copd gold 2    maint on stiolto and saba prn last dose 6 h prior to [place] Complaint  Patient presents with   Acute Visit    She had sinus surgery 3 wks ago, and then surgery again to remove chicken bone from her throat 08/25/21.  She states she feels more SOB lately, esp when exposed to strong smells. She is using her albuterol inhaler 3-5 x per day. She has been coughing up green sputum x 3 days.   Dyspnea: walk 50 ft then sob but better than prior to sinus  surgery  Cough:  worse than usual since sinus surgery / no abx > green x 3 days  Sleeping: almost having to sit straight up since sinus surgery or severe cough SABA use: saba hfa x 2 this am  02: none  Covid status:   vax x 3  Rec Levaquin 500 mg daily  x 7 days Prednisone 10 mg take  4 each am x 2 days,   2 each am x 2 days,  1 each am x 2 days and stop  Plan A = Automatic = Always=   stiolto 2 puffs each am  Work on inhaler technique:   Plan B = Backup (to supplement plan A, not to replace it) Only use your albuterol inhaler as a rescue medication   Plan C = Crisis (instead of Plan B but only if Plan B stops working) - only use your albuterol nebulizer if you first try Plan B  For cough >  delsym 2 tsp every 12 hours and supplement with norco one every 4 hours as needed and change Protonix=   double it to 40 mg  Take 30-60 min before first meal of the day and  pepcid 20 mg after supper       05/13/2022  f/u ov/Antrice Pal re: GOLD 2 copd    maint on stiolto / pulmicoort neb but new wheeze with pets plan to be gone on 05/18/22   Chief Complaint  Patient presents with   Follow-up    Increased SOB since had URI approx 2 months ago. She has had some wheezing- esp if around cats that her grandchildren brought into her home.    Dyspnea:  walking the mall x 2 hours s stopping Cough: with pet exp/ mucoid min vol Sleeping: able to lie down at 30 degrees now more comfortably SABA use: up to 5 x since pet exp, was rarely needing while on stiolto  02: no     No obvious day to day or daytime variability or assoc purulent sputum or mucus plugs or hemoptysis or cp or chest tightness, or overt sinus or hb symptoms.   Sleeping now  without nocturnal  or early am exacerbation  of respiratory  c/o's or need for noct saba. Also denies any obvious fluctuation of symptoms with weather or environmental changes or other aggravating or alleviating factors except as outlined above   No unusual exposure hx or h/o  childhood pna/ asthma or knowledge of premature birth.  Current Allergies, Complete Past Medical History, Past Surgical History, Family History, and Social History were reviewed in [employer] record.  ROS  The following are not active complaints unless bolded Hoarseness, sore throat, dysphagia, dental problems, itching, sneezing,  nasal congestion or discharge of excess mucus or purulent secretions, ear ache,   fever, chills, sweats, unintended wt loss or wt gain, classically pleuritic or exertional cp,  orthopnea pnd or arm/hand swelling  or leg swelling, presyncope, palpitations, abdominal pain, anorexia, nausea, vomiting, diarrhea  or change in bowel habits or change in bladder habits, change in stools or change in urine, dysuria, hematuria,  rash, arthralgias, visual complaints, headache, numbness, weakness or ataxia or problems with walking or coordination,  change in mood or  memory.        Current Meds  Medication Sig   acetaminophen (TYLENOL) 325 MG tablet Take 325 mg by mouth every 6 (six) hours as needed for moderate pain or headache.   albuterol (PROVENTIL) (2.5 MG/3ML) 0.083% nebulizer solution Take 2.5 mg by nebulization every 4 (four) hours as needed for wheezing or shortness of breath. PLAN C   albuterol (VENTOLIN HFA) 108 (90 Base) MCG/ACT inhaler Inhale 2 puffs into the lungs every 4 (four) hours as needed for wheezing or shortness of breath. PLAN B   Artificial Tear Ointment (DRY EYES OP) Apply 1 drop to eye daily as needed (dry eyes).   budesonide (PULMICORT) 0.5 MG/2ML nebulizer solution Take 2 mLs (0.5 mg total) by nebulization in the morning and at bedtime.   clonazePAM (KLONOPIN) 1 MG tablet Take 2 mg by mouth at bedtime.   EPINEPHrine 0.3 mg/0.3 mL IJ SOAJ injection Inject 0.3 mg into the muscle as needed.    famotidine (PEPCID) 20 MG tablet TAKE 2 TABLETS BY MOUTH EVERY NIGHT AT BEDTIME (Patient taking differently: Take 40 mg by mouth at bedtime.)    fluticasone (FLONASE) 50 MCG/ACT nasal spray Place 1 spray into both nostrils 3 (three) times daily.   HYDROcodone-acetaminophen (NORCO) 7.5-325 MG tablet Take 1 tablet by mouth every 6 (six) hours as needed for moderate pain.   montelukast (SINGULAIR) 10 MG tablet Take 10 mg by mouth at bedtime.   pantoprazole (  PROTONIX) 20 MG tablet Take 20 mg by mouth daily.   QUININE SULFATE PO Take 300 mg by mouth at bedtime.   Respiratory Therapy Supplies (FLUTTER) DEVI Use as directed   rosuvastatin (CRESTOR) 20 MG tablet Take 20 mg by mouth daily.   SALINE NASAL SPRAY NA Place 1 spray into both nostrils as needed (to keep moisture in nose).   STIOLTO RESPIMAT 2.5-2.5 MCG/ACT AERS INHALE 2 PUFFS INTO THE LUNGS DAILY          Objective:   Physical Exam  Wts  05/13/2022         161 09/03/2021        159  04/17/2021        151  07/21/2019        160  02/03/2019        163  08/03/2018        163  05/01/2018       167  03/20/2018        168  11/05/2017     167  07/15/2016       152   04/15/2016      159   03/01/16 157 lb (71.215 kg)  02/02/16 157 lb (71.215 kg)  10/09/12 142 lb (64.411 kg)     Vital signs reviewed  05/13/2022  - Note at rest 02 sats  98% on RA   General appearance:   amb wf/ slt hoarse but all smiles   HEENT : Oropharynx  clear  Nasal turbintes nl    NECK :  without  appent JVD/ palpable Nodes/TM    LUNGS: no acc muscle use,  Min barrel  contour chest wall with bilateral  slightly decreased bs s audible wheeze and  without cough on insp or exp maneuvers and min  Hyperresonant  to  percussion bilaterally    CV:  RRR  no s3 or murmur or increase in P2, and no edema   ABD:  soft and nontender with pos end  insp Hoover's  in the supine position.  No bruits or organomegaly appreciated   MS:  Nl gait/ ext warm without deformities Or obvious joint restrictions  calf tenderness, cyanosis or clubbing     SKIN: warm and dry without lesions    NEURO:  alert, approp, nl sensorium  with  no motor or cerebellar deficits apparent.            Assessment & Plan:

## 2022-05-16 ENCOUNTER — Telehealth: Payer: Self-pay | Admitting: Internal Medicine

## 2022-05-16 NOTE — Telephone Encounter (Signed)
Called and spoke to pt. Pt states she feels the Symbicort she just started is causing her new dry cough. Pt c/o dry hacking cough that started 3 hours after she began Symbicort (5/23). Pt has only taken 3 doses and stopped. Pt states she did not take her evening dose last night or this morning. Pt states she only wheezes when she is coughing. Pt denies any other new symptoms. Pt states this morning she awoke at 0400 and began having a coughing fit that resulted in coughing up little mucus with "specks of blood". Pt states she is sure the blood came from the area of a previous surgery (per pt a few months ago) when she "chocked on a chicken bone and put a hole in my throat". Pt states Dr. Wilburn Cornelia repaired the area emergently.   Dr. Melvyn Novas, please advise. Thanks.

## 2022-05-16 NOTE — Telephone Encounter (Signed)
Spoke with the pt and notified of response per Dr Wert  She verbalized understanding  Nothing further needed 

## 2022-05-16 NOTE — Telephone Encounter (Signed)
Stop the symbicort for now - still ok to use neb albuterol prn as this is the most gentle on the airway   She is listed as having narco and this is very effective for cough too if combined with mucinex dm 1200 mg bid

## 2022-05-28 ENCOUNTER — Other Ambulatory Visit: Payer: Self-pay | Admitting: Internal Medicine

## 2022-06-06 ENCOUNTER — Other Ambulatory Visit: Payer: Self-pay | Admitting: Internal Medicine

## 2022-06-06 NOTE — Telephone Encounter (Signed)
I spoke with Tracy Gray in regards to a famotidine refill request. Clarified the directions. She informed me that she is having another TMJ surgery next week. She also asked about her colonoscopy recall. She is over due. She was seen 07/2021 and was supposed to do stool studies and then set it up. She didn't complete those and now not having the diarrhea. She will call back after her TMJ surgery and set up her colonoscopy and a pre-visit.

## 2022-06-10 NOTE — H&P (Signed)
52 Female with right TMJ pain.  CC: Jaw locking, grinding noise.  HPI: Pt had TMJ sx by me 75 y ago. Doing well until about 5 y ago when she started having joint noise on right, clicking and popping, and occasional locking. Had Sinus surgery 3 months ago and had closed lock for 3 weeks after surgery. Has worn bite splint without relief.   Past Medical History: COPD, Asthma, Hay Fever or Sinus Problems, Sleep Apnea, Emphysema, Osteoporosis, full dentures, Pain and Clicking of Jaw  Medications: Quinine, Lorcet, Rosuvastatin, Pulmicort, Proventil   Allergies: Penicillin, bees  Exam:   Edentulous maxilla and mandible. Bilateral clicking of the TMJ. The maximum opening was 50 mm (edentulous), with normal range of motion. The muscles of mastication were tender to palpation on the right. There was crepitus present on the right. Oral cancer screening negative. Pharynx clear. no lymphadenopathy  Pan: Condylar anatomy with some irregularity. Possible bone spurs in the right joint space.  MRI: Right TMJ disc perforation with anterior dislocation. Left TMJ anterior disc dislocation.   Assessment: ASA  3. Right and left TMJ arthropathy.   Plan: Right TMJ arthrotomy, meniscectomy, autologous fat graft. Left TMJ arthrocentesis.   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

## 2022-06-12 ENCOUNTER — Encounter (HOSPITAL_COMMUNITY): Payer: Self-pay | Admitting: Oral Surgery

## 2022-06-12 ENCOUNTER — Other Ambulatory Visit: Payer: Self-pay

## 2022-06-12 NOTE — Progress Notes (Signed)
PCP - Dr. Mancel Bale  Cardiologist - Denies  Neurologist- Dr. Vallarie Mare  EP- Denies  Endocrine- Denies  Pulm- Denies  Chest x-ray - Denies  EKG - 07/09/21 (E)  Stress Test - Denies  ECHO - Denies  Cardiac Cath - Denies  AICD-na PM-na LOOP-na  Nerve Stimulator- Denies  Dialysis- Denies  Sleep Study - Yes- Positive CPAP - Denies  LABS- 06/14/22: CBC, BMP  ASA- Denies  ERAS-No  HA1C- Denies  Anesthesia- No  Pt denies having chest pain, sob, or fever at this time. All instructions explained to the pt, with a verbal understanding of the material including: as of today, stop taking all Aspirin (unless instructed by your doctor) and Other Aspirin containing products, Vitamins, Fish oils, and Herbal medications. Also stop all NSAIDS i.e. Advil, Ibuprofen, Motrin, Aleve, Anaprox, Naproxen, BC, Goody Powders, and all Supplements.  Pt also instructed to wear a mask and social distance if she goes out. The opportunity to ask questions was provided.

## 2022-06-14 ENCOUNTER — Other Ambulatory Visit: Payer: Self-pay

## 2022-06-14 ENCOUNTER — Ambulatory Visit (HOSPITAL_BASED_OUTPATIENT_CLINIC_OR_DEPARTMENT_OTHER): Payer: Medicare Other | Admitting: Certified Registered"

## 2022-06-14 ENCOUNTER — Ambulatory Visit (HOSPITAL_COMMUNITY): Payer: Medicare Other | Admitting: Certified Registered"

## 2022-06-14 ENCOUNTER — Ambulatory Visit (HOSPITAL_COMMUNITY)
Admission: RE | Admit: 2022-06-14 | Discharge: 2022-06-14 | Disposition: A | Discharge status: Home / Self Care | Payer: Medicare Other | Attending: Oral Surgery | Admitting: Oral Surgery

## 2022-06-14 ENCOUNTER — Encounter (HOSPITAL_COMMUNITY): Payer: Self-pay | Admitting: Oral Surgery

## 2022-06-14 ENCOUNTER — Encounter (HOSPITAL_COMMUNITY)
Admission: RE | Disposition: A | Discharge status: Home / Self Care | Payer: Self-pay | Source: Home / Self Care | Attending: Oral Surgery

## 2022-06-14 DIAGNOSIS — M1990 Unspecified osteoarthritis, unspecified site: Secondary | ICD-10-CM | POA: Diagnosis not present

## 2022-06-14 DIAGNOSIS — M26621 Arthralgia of right temporomandibular joint: Secondary | ICD-10-CM | POA: Diagnosis not present

## 2022-06-14 DIAGNOSIS — I10 Essential (primary) hypertension: Secondary | ICD-10-CM | POA: Insufficient documentation

## 2022-06-14 DIAGNOSIS — G4730 Sleep apnea, unspecified: Secondary | ICD-10-CM | POA: Diagnosis not present

## 2022-06-14 DIAGNOSIS — Z87891 Personal history of nicotine dependence: Secondary | ICD-10-CM | POA: Insufficient documentation

## 2022-06-14 DIAGNOSIS — J439 Emphysema, unspecified: Secondary | ICD-10-CM | POA: Insufficient documentation

## 2022-06-14 DIAGNOSIS — J449 Chronic obstructive pulmonary disease, unspecified: Secondary | ICD-10-CM

## 2022-06-14 DIAGNOSIS — M26623 Arthralgia of bilateral temporomandibular joint: Secondary | ICD-10-CM | POA: Diagnosis not present

## 2022-06-14 DIAGNOSIS — M797 Fibromyalgia: Secondary | ICD-10-CM | POA: Diagnosis not present

## 2022-06-14 DIAGNOSIS — M26633 Articular disc disorder of bilateral temporomandibular joint: Secondary | ICD-10-CM | POA: Diagnosis not present

## 2022-06-14 HISTORY — PX: ARTHROTOMY: SHX5728

## 2022-06-14 HISTORY — PX: ARTHROCENTESIS INJECTION: SHX6830

## 2022-06-14 LAB — CBC
HCT: 40 % (ref 36.0–46.0)
Hemoglobin: 13.7 g/dL (ref 12.0–15.0)
MCH: 32.4 pg (ref 26.0–34.0)
MCHC: 34.3 g/dL (ref 30.0–36.0)
MCV: 94.6 fL (ref 80.0–100.0)
Platelets: 266 10*3/uL (ref 150–400)
RBC: 4.23 MIL/uL (ref 3.87–5.11)
RDW: 15 % (ref 11.5–15.5)
WBC: 8.6 10*3/uL (ref 4.0–10.5)
nRBC: 0 % (ref 0.0–0.2)

## 2022-06-14 LAB — BASIC METABOLIC PANEL
Anion gap: 12 (ref 5–15)
BUN: 11 mg/dL (ref 6–20)
CO2: 23 mmol/L (ref 22–32)
Calcium: 9.2 mg/dL (ref 8.9–10.3)
Chloride: 107 mmol/L (ref 98–111)
Creatinine, Ser: 0.91 mg/dL (ref 0.44–1.00)
GFR, Estimated: >60 mL/min (ref >60)
Glucose, Bld: 104 mg/dL — ABNORMAL HIGH (ref 70–99)
Potassium: 3.4 mmol/L — ABNORMAL LOW (ref 3.5–5.1)
Sodium: 142 mmol/L (ref 135–145)

## 2022-06-14 SURGERY — ARTHROTOMY
Anesthesia: General | Site: Face | Laterality: Right

## 2022-06-14 MED ORDER — OXYMETAZOLINE HCL 0.05 % NA SOLN
NASAL | Status: AC
  Filled 2022-06-14: qty 30

## 2022-06-14 MED ORDER — CHLORHEXIDINE GLUCONATE 0.12 % MT SOLN
15.0000 mL | Freq: Once | OROMUCOSAL | Status: AC
  Administered 2022-06-14: 15 mL via OROMUCOSAL
  Filled 2022-06-14: qty 15

## 2022-06-14 MED ORDER — PROPOFOL 10 MG/ML IV BOLUS
INTRAVENOUS | Status: AC
  Filled 2022-06-14: qty 20

## 2022-06-14 MED ORDER — ACETAMINOPHEN 500 MG PO TABS: 1000.0000 mg | ORAL_TABLET | Freq: Once | ORAL | Status: DC | PRN

## 2022-06-14 MED ORDER — BUPIVACAINE-EPINEPHRINE 0.5% -1:200000 IJ SOLN
INTRAMUSCULAR | Status: AC
  Filled 2022-06-14: qty 1

## 2022-06-14 MED ORDER — FENTANYL CITRATE (PF) 250 MCG/5ML IJ SOLN
INTRAMUSCULAR | Status: AC
  Filled 2022-06-14: qty 5

## 2022-06-14 MED ORDER — IPRATROPIUM-ALBUTEROL 0.5-2.5 (3) MG/3ML IN SOLN
RESPIRATORY_TRACT | Status: AC
  Filled 2022-06-14: qty 3

## 2022-06-14 MED ORDER — ROCURONIUM BROMIDE 10 MG/ML (PF) SYRINGE
PREFILLED_SYRINGE | INTRAVENOUS | Status: DC | PRN
  Administered 2022-06-14 (×2): 50 mg via INTRAVENOUS

## 2022-06-14 MED ORDER — ACETAMINOPHEN 10 MG/ML IV SOLN
INTRAVENOUS | Status: DC | PRN
  Administered 2022-06-14: 1000 mg via INTRAVENOUS

## 2022-06-14 MED ORDER — ROCURONIUM BROMIDE 10 MG/ML (PF) SYRINGE
PREFILLED_SYRINGE | INTRAVENOUS | Status: AC
  Filled 2022-06-14: qty 10

## 2022-06-14 MED ORDER — LIDOCAINE 2% (20 MG/ML) 5 ML SYRINGE
INTRAMUSCULAR | Status: AC
  Filled 2022-06-14: qty 5

## 2022-06-14 MED ORDER — LACTATED RINGERS IV SOLN: INTRAVENOUS | Status: DC

## 2022-06-14 MED ORDER — 0.9 % SODIUM CHLORIDE (POUR BTL) OPTIME
TOPICAL | Status: DC | PRN
  Administered 2022-06-14: 1000 mL

## 2022-06-14 MED ORDER — KETOROLAC TROMETHAMINE 30 MG/ML IJ SOLN
INTRAMUSCULAR | Status: DC | PRN
  Administered 2022-06-14: 30 mg via INTRAVENOUS

## 2022-06-14 MED ORDER — ACETAMINOPHEN 160 MG/5ML PO SOLN: 1000.0000 mg | Freq: Once | ORAL | Status: DC | PRN

## 2022-06-14 MED ORDER — DEXAMETHASONE SODIUM PHOSPHATE 10 MG/ML IJ SOLN
INTRAMUSCULAR | Status: DC | PRN
  Administered 2022-06-14: 10 mg via INTRAVENOUS

## 2022-06-14 MED ORDER — ACETAMINOPHEN 10 MG/ML IV SOLN
INTRAVENOUS | Status: AC
Start: 2022-06-14 — End: ?
  Filled 2022-06-14: qty 100

## 2022-06-14 MED ORDER — METHYLPREDNISOLONE ACETATE 80 MG/ML IJ SUSP
INTRAMUSCULAR | Status: AC
  Filled 2022-06-14: qty 1

## 2022-06-14 MED ORDER — LIDOCAINE 2% (20 MG/ML) 5 ML SYRINGE
INTRAMUSCULAR | Status: DC | PRN
  Administered 2022-06-14: 40 mg via INTRAVENOUS

## 2022-06-14 MED ORDER — OXYCODONE HCL 5 MG/5ML PO SOLN: 5.0000 mg | Freq: Once | ORAL | Status: DC | PRN

## 2022-06-14 MED ORDER — OXYCODONE HCL 5 MG PO TABS: 5.0000 mg | ORAL_TABLET | Freq: Once | ORAL | Status: DC | PRN

## 2022-06-14 MED ORDER — BACITRACIN ZINC 500 UNIT/GM EX OINT
TOPICAL_OINTMENT | CUTANEOUS | Status: AC
  Filled 2022-06-14: qty 28.35

## 2022-06-14 MED ORDER — OXYCODONE-ACETAMINOPHEN 7.5-325 MG PO TABS: 1.0000 | ORAL_TABLET | ORAL | 0 refills | Status: AC | PRN

## 2022-06-14 MED ORDER — IPRATROPIUM-ALBUTEROL 0.5-2.5 (3) MG/3ML IN SOLN
3.0000 mL | Freq: Once | RESPIRATORY_TRACT | Status: AC
Start: 2022-06-14 — End: 2022-06-14
  Administered 2022-06-14: 3 mL via RESPIRATORY_TRACT

## 2022-06-14 MED ORDER — METHYLPREDNISOLONE ACETATE 80 MG/ML IJ SUSP
INTRAMUSCULAR | Status: DC | PRN
  Administered 2022-06-14: 80 mg

## 2022-06-14 MED ORDER — MIDAZOLAM HCL 2 MG/2ML IJ SOLN
INTRAMUSCULAR | Status: AC
  Filled 2022-06-14: qty 2

## 2022-06-14 MED ORDER — FENTANYL CITRATE (PF) 100 MCG/2ML IJ SOLN: 25.0000 ug | INTRAMUSCULAR | Status: DC | PRN

## 2022-06-14 MED ORDER — FENTANYL CITRATE (PF) 250 MCG/5ML IJ SOLN
INTRAMUSCULAR | Status: DC | PRN
  Administered 2022-06-14 (×3): 50 ug via INTRAVENOUS
  Administered 2022-06-14: 100 ug via INTRAVENOUS

## 2022-06-14 MED ORDER — PHENYLEPHRINE 80 MCG/ML (10ML) SYRINGE FOR IV PUSH (FOR BLOOD PRESSURE SUPPORT)
PREFILLED_SYRINGE | INTRAVENOUS | Status: DC | PRN
  Administered 2022-06-14: 160 ug via INTRAVENOUS
  Administered 2022-06-14 (×2): 40 ug via INTRAVENOUS

## 2022-06-14 MED ORDER — ONDANSETRON HCL 4 MG/2ML IJ SOLN
INTRAMUSCULAR | Status: AC
  Filled 2022-06-14: qty 2

## 2022-06-14 MED ORDER — SUGAMMADEX SODIUM 200 MG/2ML IV SOLN
INTRAVENOUS | Status: DC | PRN
  Administered 2022-06-14: 200 mg via INTRAVENOUS

## 2022-06-14 MED ORDER — ONDANSETRON HCL 4 MG/2ML IJ SOLN
INTRAMUSCULAR | Status: DC | PRN
  Administered 2022-06-14: 4 mg via INTRAVENOUS

## 2022-06-14 MED ORDER — OXYMETAZOLINE HCL 0.05 % NA SOLN
NASAL | Status: DC | PRN
  Administered 2022-06-14 (×2): 2 via NASAL

## 2022-06-14 MED ORDER — DEXAMETHASONE SODIUM PHOSPHATE 10 MG/ML IJ SOLN
INTRAMUSCULAR | Status: AC
  Filled 2022-06-14: qty 1

## 2022-06-14 MED ORDER — PHENYLEPHRINE HCL-NACL 20-0.9 MG/250ML-% IV SOLN
INTRAVENOUS | Status: DC | PRN
  Administered 2022-06-14: 50 ug/min via INTRAVENOUS

## 2022-06-14 MED ORDER — MIDAZOLAM HCL 2 MG/2ML IJ SOLN
INTRAMUSCULAR | Status: DC | PRN
  Administered 2022-06-14: 2 mg via INTRAVENOUS

## 2022-06-14 MED ORDER — ORAL CARE MOUTH RINSE: 15.0000 mL | Freq: Once | OROMUCOSAL | Status: AC

## 2022-06-14 MED ORDER — KETOROLAC TROMETHAMINE 30 MG/ML IJ SOLN
INTRAMUSCULAR | Status: AC
  Filled 2022-06-14: qty 1

## 2022-06-14 MED ORDER — SODIUM CHLORIDE (PF) 0.9 % IJ SOLN
INTRAMUSCULAR | Status: DC | PRN
  Administered 2022-06-14: 16 mL via VAGINAL

## 2022-06-14 MED ORDER — SODIUM HYALURONATE 10 MG/ML IO SOLUTION
PREFILLED_SYRINGE | INTRAOCULAR | Status: DC | PRN
  Administered 2022-06-14: 2 mL via INTRAOCULAR

## 2022-06-14 MED ORDER — ACETAMINOPHEN 10 MG/ML IV SOLN: 1000.0000 mg | Freq: Once | INTRAVENOUS | Status: DC | PRN

## 2022-06-14 MED ORDER — VANCOMYCIN HCL 1500 MG/300ML IV SOLN
1500.0000 mg | INTRAVENOUS | Status: AC
Start: 2022-06-14 — End: 2022-06-14
  Administered 2022-06-14: 1500 mg via INTRAVENOUS
  Filled 2022-06-14: qty 300

## 2022-06-14 MED ORDER — PROPOFOL 10 MG/ML IV BOLUS
INTRAVENOUS | Status: DC | PRN
  Administered 2022-06-14: 90 mg via INTRAVENOUS

## 2022-06-14 SURGICAL SUPPLY — 68 items
ATTRACTOMAT 16X20 MAGNETIC DRP (DRAPES) IMPLANT
BAG COUNTER SPONGE SURGICOUNT (BAG) ×3 IMPLANT
BENZOIN TINCTURE PRP APPL 2/3 (GAUZE/BANDAGES/DRESSINGS) ×3 IMPLANT
BLADE CLIPPER SURG (BLADE) ×3 IMPLANT
BLADE EYE MINI 60D BEAVER (BLADE) ×3 IMPLANT
BLADE MINI 60D BLUE (BLADE) ×1 IMPLANT
BNDG ELASTIC 3X5.8 VLCR STR LF (GAUZE/BANDAGES/DRESSINGS) ×1 IMPLANT
BNDG ELASTIC 4X5.8 VLCR STR LF (GAUZE/BANDAGES/DRESSINGS) ×3 IMPLANT
BNDG GAUZE DERMACEA FLUFF (GAUZE/BANDAGES/DRESSINGS) ×1
BNDG GAUZE DERMACEA FLUFF 4 (GAUZE/BANDAGES/DRESSINGS) IMPLANT
BNDG GAUZE ELAST 4 BULKY (GAUZE/BANDAGES/DRESSINGS) ×3 IMPLANT
BUR CROSS CUT FISSURE 1.6 (BURR) IMPLANT
BUR EGG ELITE 4.0 (BURR) IMPLANT
CANISTER SUCT 3000ML PPV (MISCELLANEOUS) ×3 IMPLANT
CLEANER TIP ELECTROSURG 2X2 (MISCELLANEOUS) ×3 IMPLANT
CLSR STERI-STRIP ANTIMIC 1/2X4 (GAUZE/BANDAGES/DRESSINGS) ×3 IMPLANT
COTTONBALL LRG STERILE PKG (GAUZE/BANDAGES/DRESSINGS) ×1 IMPLANT
COVER SURGICAL LIGHT HANDLE (MISCELLANEOUS) ×3 IMPLANT
DRAPE C-ARM 35X43 STRL (DRAPES) ×1 IMPLANT
DRAPE EENT ADH APERT 15X15 STR (DRAPES) ×3 IMPLANT
DRAPE INCISE IOBAN 66X45 STRL (DRAPES) IMPLANT
DRAPE LAPAROTOMY T 98X78 PEDS (DRAPES) ×3 IMPLANT
DRAPE ORTHO SPLIT 77X108 STRL (DRAPES) ×3
DRAPE SURG 17X23 STRL (DRAPES) ×6 IMPLANT
DRAPE SURG ORHT 6 SPLT 77X108 (DRAPES) ×2 IMPLANT
DRSG COVADERM 4X6 (GAUZE/BANDAGES/DRESSINGS) ×1 IMPLANT
DRSG TELFA 3X8 NADH (GAUZE/BANDAGES/DRESSINGS) ×6 IMPLANT
ELECT COATED BLADE 2.86 ST (ELECTRODE) ×3 IMPLANT
ELECT REM PT RETURN 9FT ADLT (ELECTROSURGICAL) ×3
ELECTRODE REM PT RTRN 9FT ADLT (ELECTROSURGICAL) ×2 IMPLANT
GAUZE 4X4 16PLY ~~LOC~~+RFID DBL (SPONGE) ×1 IMPLANT
GAUZE SPONGE 4X4 12PLY STRL (GAUZE/BANDAGES/DRESSINGS) ×1 IMPLANT
GLOVE BIO SURGEON STRL SZ8 (GLOVE) ×3 IMPLANT
GOWN STRL REUS W/ TWL LRG LVL3 (GOWN DISPOSABLE) ×2 IMPLANT
GOWN STRL REUS W/ TWL XL LVL3 (GOWN DISPOSABLE) ×2 IMPLANT
GOWN STRL REUS W/TWL LRG LVL3 (GOWN DISPOSABLE) ×3
GOWN STRL REUS W/TWL XL LVL3 (GOWN DISPOSABLE) ×3
KIT BASIN OR (CUSTOM PROCEDURE TRAY) ×3 IMPLANT
KIT TURNOVER KIT B (KITS) ×3 IMPLANT
LOCATOR NERVE 3 VOLT (DISPOSABLE) IMPLANT
NDL HYPO 21X1.5 SAFETY (NEEDLE) IMPLANT
NEEDLE 22X1 1/2 (OR ONLY) (NEEDLE) ×3 IMPLANT
NEEDLE HYPO 21X1.5 SAFETY (NEEDLE) ×6 IMPLANT
NS IRRIG 1000ML POUR BTL (IV SOLUTION) ×3 IMPLANT
PAD ARMBOARD 7.5X6 YLW CONV (MISCELLANEOUS) ×6 IMPLANT
PAD DRESSING TELFA 3X8 NADH (GAUZE/BANDAGES/DRESSINGS) ×2 IMPLANT
PENCIL BUTTON HOLSTER BLD 10FT (ELECTRODE) ×3 IMPLANT
POSITIONER HEAD DONUT 9IN (MISCELLANEOUS) ×3 IMPLANT
SLEEVE IRRIGATION ELITE 7 (MISCELLANEOUS) IMPLANT
STAPLER VISISTAT 35W (STAPLE) IMPLANT
SUT CHROMIC 3 0 PS 2 (SUTURE) ×4 IMPLANT
SUT CHROMIC 4 0 P 3 18 (SUTURE) IMPLANT
SUT ETHILON 3 0 PS 1 (SUTURE) ×1 IMPLANT
SUT ETHILON 4 0 PS 2 18 (SUTURE) IMPLANT
SUT MERSILENE 4 0 P 3 (SUTURE) IMPLANT
SUT PROLENE 5 0 P 3 (SUTURE) ×3 IMPLANT
SUT PROLENE 5 0 PS 2 (SUTURE) ×1 IMPLANT
SUT SILK 2 0 (SUTURE) ×6
SUT SILK 2-0 18XBRD TIE 12 (SUTURE) ×2 IMPLANT
SUT VIC AB 4-0 PC1 18 (SUTURE) ×3 IMPLANT
SUT VICRYL 3-0 RB1 18 ABS (SUTURE) ×2 IMPLANT
SYR 10ML LL (SYRINGE) ×3 IMPLANT
SYR 20ML LL LF (SYRINGE) ×1 IMPLANT
TOWEL GREEN STERILE (TOWEL DISPOSABLE) ×3 IMPLANT
TOWEL GREEN STERILE FF (TOWEL DISPOSABLE) ×3 IMPLANT
TRAY ENT MC OR (CUSTOM PROCEDURE TRAY) ×3 IMPLANT
TUBE CONNECTING 12X1/4 (SUCTIONS) ×3 IMPLANT
YANKAUER SUCT BULB TIP NO VENT (SUCTIONS) ×3 IMPLANT

## 2022-06-14 NOTE — Transfer of Care (Signed)
Immediate Anesthesia Transfer of Care Note  Patient: Tracy Gray  Procedure(s) Performed: RIGHT TEMPORAL MANDIBULAR JOINT ARTHROTOMY WITH MENISECTOMY AND FAT GRAFT (Right: Face) LEFT ARTHROCENTESIS INJECTION (Left: Face)  Patient Location: PACU  Anesthesia Type:General  Level of Consciousness: drowsy and patient cooperative  Airway & Oxygen Therapy: Patient Spontanous Breathing  Post-op Assessment: Report given to RN and Post -op Vital signs reviewed and stable  Post vital signs: Reviewed and stable  Last Vitals:  Vitals Value Taken Time  BP 127/69 06/14/22 0935  Temp 36.6 C 06/14/22 0935  Pulse 86 06/14/22 0937  Resp 16 06/14/22 0937  SpO2 92 % 06/14/22 0937  Vitals shown include unvalidated device data.  Last Pain:  Vitals:   06/14/22 0619  TempSrc:   PainSc: 0-No pain      Patients Stated Pain Goal: 3 (06/12/22 1833)  Complications: No notable events documented.

## 2022-06-14 NOTE — Op Note (Signed)
06/14/2022  9:24 AM  PATIENT:  Tracy Gray  56 y.o. female  PRE-OPERATIVE DIAGNOSIS:  BILATERAL TEMPORAL MANDIBULAR JOINT DISC DISPLACEMENT  POST-OPERATIVE DIAGNOSIS:  SAME  PROCEDURE:  Procedure(s): RIGHT TEMPORAL MANDIBULAR JOINT ARTHROTOMY WITH MENISECTOMY AND FAT GRAFT LEFT TMJ, ARTHROCENTESIS INJECTION  SURGEON:  Surgeon(s): Ocie Doyne, DMD  ANESTHESIA:   local and general  EBL:  minimal  DRAINS: none   SPECIMEN:  No Specimen  COUNTS:  YES  PLAN OF CARE: Discharge to home after PACU  PATIENT DISPOSITION:  PACU - hemodynamically stable.   PROCEDURE DETAILS: Dictation #[id number]  Georgia Lopes, DMD 06/14/2022 9:24 AM

## 2022-06-14 NOTE — Anesthesia Procedure Notes (Signed)
Procedure Name: Intubation Date/Time: 06/14/2022 7:32 AM  Performed by: Rosiland Oz, CRNAPre-anesthesia Checklist: Patient identified, Emergency Drugs available, Suction available, Patient being monitored and Timeout performed Patient Re-evaluated:Patient Re-evaluated prior to induction Oxygen Delivery Method: Circle system utilized Preoxygenation: Pre-oxygenation with 100% oxygen Induction Type: IV induction Ventilation: Mask ventilation without difficulty Laryngoscope Size: Miller and 3 Grade View: Grade I Nasal Tubes: Left, Nasal prep performed and Nasal Rae Tube size: 6.5 mm Number of attempts: 1 Placement Confirmation: ETT inserted through vocal cords under direct vision, positive ETCO2 and breath sounds checked- equal and bilateral Tube secured with: Tape Dental Injury: Teeth and Oropharynx as per pre-operative assessment

## 2022-06-16 ENCOUNTER — Encounter (HOSPITAL_COMMUNITY): Payer: Self-pay | Admitting: Oral Surgery

## 2022-06-19 MED FILL — Sodium Hyaluronate Intra-articular Soln Pref Syr 20 MG/2ML: INTRA_ARTICULAR | Qty: 2 | Status: AC

## 2022-10-09 ENCOUNTER — Other Ambulatory Visit: Payer: Self-pay | Admitting: Internal Medicine

## 2022-11-01 ENCOUNTER — Other Ambulatory Visit: Payer: Self-pay | Admitting: Internal Medicine

## 2023-04-10 ENCOUNTER — Other Ambulatory Visit: Payer: Self-pay | Admitting: *Deleted

## 2023-04-10 MED ORDER — STIOLTO RESPIMAT 2.5-2.5 MCG/ACT IN AERS: 2.0000 | INHALATION_SPRAY | Freq: Every day | RESPIRATORY_TRACT | 5 refills | Status: DC

## 2023-09-03 ENCOUNTER — Other Ambulatory Visit: Payer: Self-pay | Admitting: *Deleted

## 2023-09-03 DIAGNOSIS — Z122 Encounter for screening for malignant neoplasm of respiratory organs: Secondary | ICD-10-CM

## 2023-09-03 DIAGNOSIS — Z87891 Personal history of nicotine dependence: Secondary | ICD-10-CM

## 2023-09-19 ENCOUNTER — Encounter (HOSPITAL_COMMUNITY): Payer: Self-pay

## 2023-09-19 ENCOUNTER — Inpatient Hospital Stay (HOSPITAL_COMMUNITY)
Admission: EM | Admit: 2023-09-19 | Discharge: 2023-09-25 | DRG: 190 | Disposition: A | Discharge status: Home / Self Care | Payer: 59 | Attending: Internal Medicine | Admitting: Internal Medicine

## 2023-09-19 ENCOUNTER — Emergency Department (HOSPITAL_COMMUNITY): Payer: 59

## 2023-09-19 ENCOUNTER — Ambulatory Visit
Admission: RE | Admit: 2023-09-19 | Discharge: 2023-09-19 | Disposition: A | Discharge status: Home / Self Care | Payer: 59 | Source: Ambulatory Visit

## 2023-09-19 ENCOUNTER — Other Ambulatory Visit: Payer: Self-pay

## 2023-09-19 ENCOUNTER — Ambulatory Visit: Payer: Self-pay

## 2023-09-19 VITALS — BP 150/70 | HR 122 | Temp 98.7°F | Resp 30 | Ht 64.0 in | Wt 155.0 lb

## 2023-09-19 DIAGNOSIS — Z9109 Other allergy status, other than to drugs and biological substances: Secondary | ICD-10-CM

## 2023-09-19 DIAGNOSIS — G35 Multiple sclerosis: Secondary | ICD-10-CM | POA: Diagnosis present

## 2023-09-19 DIAGNOSIS — I10 Essential (primary) hypertension: Secondary | ICD-10-CM | POA: Diagnosis present

## 2023-09-19 DIAGNOSIS — Z860100 Personal history of colon polyps, unspecified: Secondary | ICD-10-CM | POA: Diagnosis not present

## 2023-09-19 DIAGNOSIS — Z79899 Other long term (current) drug therapy: Secondary | ICD-10-CM

## 2023-09-19 DIAGNOSIS — B9789 Other viral agents as the cause of diseases classified elsewhere: Secondary | ICD-10-CM | POA: Diagnosis present

## 2023-09-19 DIAGNOSIS — E785 Hyperlipidemia, unspecified: Secondary | ICD-10-CM | POA: Diagnosis present

## 2023-09-19 DIAGNOSIS — F419 Anxiety disorder, unspecified: Secondary | ICD-10-CM | POA: Diagnosis present

## 2023-09-19 DIAGNOSIS — Z888 Allergy status to other drugs, medicaments and biological substances status: Secondary | ICD-10-CM | POA: Diagnosis not present

## 2023-09-19 DIAGNOSIS — Z825 Family history of asthma and other chronic lower respiratory diseases: Secondary | ICD-10-CM

## 2023-09-19 DIAGNOSIS — M797 Fibromyalgia: Secondary | ICD-10-CM | POA: Diagnosis present

## 2023-09-19 DIAGNOSIS — Z1152 Encounter for screening for COVID-19: Secondary | ICD-10-CM | POA: Diagnosis not present

## 2023-09-19 DIAGNOSIS — J9601 Acute respiratory failure with hypoxia: Secondary | ICD-10-CM | POA: Insufficient documentation

## 2023-09-19 DIAGNOSIS — Z79890 Hormone replacement therapy (postmenopausal): Secondary | ICD-10-CM

## 2023-09-19 DIAGNOSIS — Z87891 Personal history of nicotine dependence: Secondary | ICD-10-CM | POA: Diagnosis not present

## 2023-09-19 DIAGNOSIS — D649 Anemia, unspecified: Secondary | ICD-10-CM | POA: Insufficient documentation

## 2023-09-19 DIAGNOSIS — J439 Emphysema, unspecified: Secondary | ICD-10-CM | POA: Diagnosis present

## 2023-09-19 DIAGNOSIS — Z7952 Long term (current) use of systemic steroids: Secondary | ICD-10-CM

## 2023-09-19 DIAGNOSIS — Z21 Asymptomatic human immunodeficiency virus [HIV] infection status: Secondary | ICD-10-CM | POA: Diagnosis present

## 2023-09-19 DIAGNOSIS — G4733 Obstructive sleep apnea (adult) (pediatric): Secondary | ICD-10-CM | POA: Diagnosis present

## 2023-09-19 DIAGNOSIS — R0602 Shortness of breath: Secondary | ICD-10-CM | POA: Diagnosis not present

## 2023-09-19 DIAGNOSIS — I951 Orthostatic hypotension: Secondary | ICD-10-CM | POA: Diagnosis present

## 2023-09-19 DIAGNOSIS — R519 Headache, unspecified: Secondary | ICD-10-CM | POA: Diagnosis present

## 2023-09-19 DIAGNOSIS — Z91030 Bee allergy status: Secondary | ICD-10-CM

## 2023-09-19 DIAGNOSIS — J441 Chronic obstructive pulmonary disease with (acute) exacerbation: Secondary | ICD-10-CM | POA: Diagnosis present

## 2023-09-19 DIAGNOSIS — I9589 Other hypotension: Secondary | ICD-10-CM | POA: Diagnosis not present

## 2023-09-19 DIAGNOSIS — J449 Chronic obstructive pulmonary disease, unspecified: Secondary | ICD-10-CM | POA: Diagnosis present

## 2023-09-19 DIAGNOSIS — Z79891 Long term (current) use of opiate analgesic: Secondary | ICD-10-CM

## 2023-09-19 DIAGNOSIS — Z880 Allergy status to penicillin: Secondary | ICD-10-CM

## 2023-09-19 DIAGNOSIS — K219 Gastro-esophageal reflux disease without esophagitis: Secondary | ICD-10-CM | POA: Diagnosis present

## 2023-09-19 DIAGNOSIS — R000 Tachycardia, unspecified: Secondary | ICD-10-CM | POA: Diagnosis present

## 2023-09-19 DIAGNOSIS — E861 Hypovolemia: Secondary | ICD-10-CM | POA: Diagnosis not present

## 2023-09-19 DIAGNOSIS — Z7951 Long term (current) use of inhaled steroids: Secondary | ICD-10-CM

## 2023-09-19 DIAGNOSIS — I959 Hypotension, unspecified: Secondary | ICD-10-CM

## 2023-09-19 LAB — URINALYSIS, W/ REFLEX TO CULTURE (INFECTION SUSPECTED)
Bacteria, UA: NONE SEEN
Bilirubin Urine: NEGATIVE
Glucose, UA: NEGATIVE mg/dL
Ketones, ur: NEGATIVE mg/dL
Leukocytes,Ua: NEGATIVE
Nitrite: NEGATIVE
Protein, ur: 30 mg/dL — AB
Specific Gravity, Urine: 1.019 (ref 1.005–1.030)
pH: 5 (ref 5.0–8.0)

## 2023-09-19 LAB — CBC WITH DIFFERENTIAL/PLATELET
Abs Immature Granulocytes: 0.03 10*3/uL (ref 0.00–0.07)
Basophils Absolute: 0 10*3/uL (ref 0.0–0.1)
Basophils Relative: 0 %
Eosinophils Absolute: 0 10*3/uL (ref 0.0–0.5)
Eosinophils Relative: 0 %
HCT: 36 % (ref 36.0–46.0)
Hemoglobin: 12.2 g/dL (ref 12.0–15.0)
Immature Granulocytes: 0 %
Lymphocytes Relative: 6 %
Lymphs Abs: 0.5 10*3/uL — ABNORMAL LOW (ref 0.7–4.0)
MCH: 31.7 pg (ref 26.0–34.0)
MCHC: 33.9 g/dL (ref 30.0–36.0)
MCV: 93.5 fL (ref 80.0–100.0)
Monocytes Absolute: 1 10*3/uL (ref 0.1–1.0)
Monocytes Relative: 11 %
Neutro Abs: 8 10*3/uL — ABNORMAL HIGH (ref 1.7–7.7)
Neutrophils Relative %: 83 %
Platelets: 262 10*3/uL (ref 150–400)
RBC: 3.85 MIL/uL — ABNORMAL LOW (ref 3.87–5.11)
RDW: 14.1 % (ref 11.5–15.5)
WBC: 9.6 10*3/uL (ref 4.0–10.5)
nRBC: 0 % (ref 0.0–0.2)

## 2023-09-19 LAB — I-STAT CG4 LACTIC ACID, ED
Lactic Acid, Venous: 1.5 mmol/L (ref 0.5–1.9)
Lactic Acid, Venous: 1.5 mmol/L (ref 0.5–1.9)

## 2023-09-19 LAB — COMPREHENSIVE METABOLIC PANEL
ALT: 18 U/L (ref 0–44)
AST: 21 U/L (ref 15–41)
Albumin: 3.6 g/dL (ref 3.5–5.0)
Alkaline Phosphatase: 118 U/L (ref 38–126)
Anion gap: 11 (ref 5–15)
BUN: 7 mg/dL (ref 6–20)
CO2: 24 mmol/L (ref 22–32)
Calcium: 8.9 mg/dL (ref 8.9–10.3)
Chloride: 106 mmol/L (ref 98–111)
Creatinine, Ser: 0.73 mg/dL (ref 0.44–1.00)
GFR, Estimated: >60 mL/min (ref >60)
Glucose, Bld: 146 mg/dL — ABNORMAL HIGH (ref 70–99)
Potassium: 3.2 mmol/L — ABNORMAL LOW (ref 3.5–5.1)
Sodium: 141 mmol/L (ref 135–145)
Total Bilirubin: 0.5 mg/dL (ref 0.3–1.2)
Total Protein: 7.4 g/dL (ref 6.5–8.1)

## 2023-09-19 LAB — TROPONIN I (HIGH SENSITIVITY)
Troponin I (High Sensitivity): 5 ng/L (ref <18)
Troponin I (High Sensitivity): 5 ng/L (ref <18)

## 2023-09-19 LAB — SARS CORONAVIRUS 2 BY RT PCR: SARS Coronavirus 2 by RT PCR: NEGATIVE

## 2023-09-19 LAB — BRAIN NATRIURETIC PEPTIDE: B Natriuretic Peptide: 151.2 pg/mL — ABNORMAL HIGH (ref 0.0–100.0)

## 2023-09-19 MED ORDER — LACTATED RINGERS IV BOLUS
1000.0000 mL | Freq: Once | INTRAVENOUS | Status: AC
  Administered 2023-09-19: 1000 mL via INTRAVENOUS

## 2023-09-19 MED ORDER — ACETAMINOPHEN 325 MG PO TABS
650.0000 mg | ORAL_TABLET | Freq: Four times a day (QID) | ORAL | Status: DC | PRN
  Administered 2023-09-22: 650 mg via ORAL
  Filled 2023-09-19 (×2): qty 2

## 2023-09-19 MED ORDER — IPRATROPIUM-ALBUTEROL 0.5-2.5 (3) MG/3ML IN SOLN
3.0000 mL | Freq: Once | RESPIRATORY_TRACT | Status: AC
  Administered 2023-09-19: 3 mL via RESPIRATORY_TRACT
  Filled 2023-09-19: qty 3

## 2023-09-19 MED ORDER — SODIUM CHLORIDE 0.9% FLUSH
3.0000 mL | Freq: Two times a day (BID) | INTRAVENOUS | Status: DC
  Administered 2023-09-19 – 2023-09-25 (×12): 3 mL via INTRAVENOUS

## 2023-09-19 MED ORDER — POTASSIUM CHLORIDE 20 MEQ PO PACK
60.0000 meq | PACK | Freq: Once | ORAL | Status: AC
  Administered 2023-09-19: 60 meq via ORAL
  Filled 2023-09-19: qty 3

## 2023-09-19 MED ORDER — INFLUENZA VIRUS VACC SPLIT PF (FLUZONE) 0.5 ML IM SUSY
0.5000 mL | PREFILLED_SYRINGE | INTRAMUSCULAR | Status: DC
  Filled 2023-09-19: qty 0.5

## 2023-09-19 MED ORDER — POLYETHYLENE GLYCOL 3350 17 G PO PACK: 17.0000 g | PACK | Freq: Every day | ORAL | Status: DC | PRN

## 2023-09-19 MED ORDER — LEVOFLOXACIN IN D5W 500 MG/100ML IV SOLN
500.0000 mg | INTRAVENOUS | Status: DC
  Administered 2023-09-19: 500 mg via INTRAVENOUS
  Filled 2023-09-19: qty 100

## 2023-09-19 MED ORDER — PNEUMOCOCCAL 20-VAL CONJ VACC 0.5 ML IM SUSY
0.5000 mL | PREFILLED_SYRINGE | INTRAMUSCULAR | Status: DC
  Filled 2023-09-19: qty 0.5

## 2023-09-19 MED ORDER — FLUTICASONE FUROATE-VILANTEROL 100-25 MCG/ACT IN AEPB
1.0000 | INHALATION_SPRAY | Freq: Every day | RESPIRATORY_TRACT | Status: DC
  Administered 2023-09-20 – 2023-09-25 (×6): 1 via RESPIRATORY_TRACT
  Filled 2023-09-19: qty 28

## 2023-09-19 MED ORDER — ACETAMINOPHEN 650 MG RE SUPP: 650.0000 mg | Freq: Four times a day (QID) | RECTAL | Status: DC | PRN

## 2023-09-19 MED ORDER — ENOXAPARIN SODIUM 40 MG/0.4ML IJ SOSY
40.0000 mg | PREFILLED_SYRINGE | INTRAMUSCULAR | Status: DC
  Administered 2023-09-20 – 2023-09-25 (×6): 40 mg via SUBCUTANEOUS
  Filled 2023-09-19 (×6): qty 0.4

## 2023-09-19 MED ORDER — SODIUM CHLORIDE 0.9 % IV SOLN: 500.0000 mg | INTRAVENOUS | Status: DC

## 2023-09-19 MED ORDER — IPRATROPIUM-ALBUTEROL 0.5-2.5 (3) MG/3ML IN SOLN
3.0000 mL | RESPIRATORY_TRACT | Status: DC | PRN
  Administered 2023-09-19: 3 mL via RESPIRATORY_TRACT
  Filled 2023-09-19: qty 3

## 2023-09-19 MED ORDER — IOHEXOL 350 MG/ML SOLN
75.0000 mL | Freq: Once | INTRAVENOUS | Status: AC | PRN
  Administered 2023-09-19: 75 mL via INTRAVENOUS

## 2023-09-19 MED ORDER — PREDNISONE 50 MG PO TABS: 50.0000 mg | ORAL_TABLET | Freq: Every day | ORAL | Status: DC

## 2023-09-19 MED ORDER — PANTOPRAZOLE SODIUM 20 MG PO TBEC
20.0000 mg | DELAYED_RELEASE_TABLET | Freq: Every day | ORAL | Status: DC
  Administered 2023-09-20 – 2023-09-25 (×6): 20 mg via ORAL
  Filled 2023-09-19 (×6): qty 1

## 2023-09-19 MED ORDER — METHYLPREDNISOLONE SODIUM SUCC 125 MG IJ SOLR
125.0000 mg | Freq: Once | INTRAMUSCULAR | Status: AC
  Administered 2023-09-19: 125 mg via INTRAVENOUS
  Filled 2023-09-19: qty 2

## 2023-09-19 MED ORDER — IPRATROPIUM-ALBUTEROL 0.5-2.5 (3) MG/3ML IN SOLN
3.0000 mL | Freq: Once | RESPIRATORY_TRACT | Status: AC
  Administered 2023-09-19: 3 mL via RESPIRATORY_TRACT

## 2023-09-19 NOTE — H&P (Addendum)
History and Physical    Patient: Tracy Gray WUJ:[id number] DOB: Jul 06, 1966 DOA: 09/19/2023 DOS: the patient was seen and examined on 09/19/2023 PCP: Burton Apley, MD  Patient coming from:  Sent from urgent care facility  Chief Complaint:  Chief Complaint  Patient presents with   Shortness of Breath   HPI: Tracy Gray is a 57 y.o. female with medical history significant of known COPD for which she has been diagnosed at least 10 years ago.  Patient quit tobacco use approximately 4 years ago.  Patient describes, in general her exercise capacity as being able to playfully run around her grandkids at home.  But certainly cannot indefinite distances or actually run in the yard behind them due to shortness of breath.  Patient reports that she was in her usual state of health till about 2 weeks ago, since then she has had nasal congestion, with copious drainage of sinus discharge posteriorly into the nasopharynx and marked cough with greenish expectoration.  Patient reports that she has previously had sinus surgeries and therefore she cannot blow her nose.  She has also had insidious onset of sensation of shortness of breath since then.  Which was initially primarily exertional but now presents somewhat even at rest.  Patient was pretty much incapacitated today and yesterday.  And stayed in bed, did not eat or drink much.  Patient initially went to an urgent care facility where she was noted to be markedly tachypneic just getting out of the car and beginning to walk.  Patient was found to be hypoxic on exertion and sent to [facility], ER.  Here, after discussion with ER attending it is corroborated that patient was having some interruption of her speech during prolonged sentences due to need to breathe.  And also was noted to have exertional hypoxemia.  There is no report of patient having any fevers chest pain vomiting diarrhea palpitation leg swelling.  ER workup is notable for a CAT scan  pulmonary embolism protocol being done.  Medical evaluation is sought.  Patient does not use supplementary oxygen at home, has currently been stabilized on 2 to 3 L/min of supplementary oxygen, currently 98% SpO2 on same.  Review of Systems: As mentioned in the history of present illness. All other systems reviewed and are negative. Past Medical History:  Diagnosis Date   Adenomatous colon polyp 03/26/2018   3 adenomas, max 10mm   Allergy    Anxiety    Asthma    Chronic headache    COPD (chronic obstructive pulmonary disease) (HCC)    Degenerative disc disease    Fibromyalgia    GERD (gastroesophageal reflux disease)    Hyperlipidemia    Hypertension    Multiple sclerosis (HCC)    Nerve damage    OSA (obstructive sleep apnea)    not on CPAP    Sleep apnea    Past Surgical History:  Procedure Laterality Date   APPENDECTOMY     ARTHROCENTESIS INJECTION Left 06/14/2022   Procedure: LEFT ARTHROCENTESIS INJECTION;  Surgeon: Ocie Doyne, DMD;  Location: [facility] OR;  Service: Oral Surgery;  Laterality: Left;  jaw   ARTHROTOMY Right 06/14/2022   Procedure: RIGHT TEMPORAL MANDIBULAR JOINT ARTHROTOMY WITH MENISECTOMY AND FAT GRAFT;  Surgeon: Ocie Doyne, DMD;  Location: [facility] OR;  Service: Oral Surgery;  Laterality: Right;   COLONOSCOPY     DILATION AND CURETTAGE OF UTERUS     DIRECT LARYNGOSCOPY N/A 08/25/2021   Procedure: DIRECT LARYNGOSCOPY AND ESOPHAGOSCOPY, REMOVAL OF FOREIGN  BODY;  Surgeon: Osborn Coho, MD;  Location: [facility] OR;  Service: ENT;  Laterality: N/A;   ECTOPIC PREGNANCY SURGERY     ESOPHAGEAL MANOMETRY N/A 09/07/2018   Procedure: ESOPHAGEAL MANOMETRY (EM);  Surgeon: Napoleon Form, MD;  Location: [facility];  Service: Endoscopy;  Laterality: N/A;   ESOPHAGOGASTRODUODENOSCOPY     EXPLORATORY LAPAROTOMY     SINUS ENDO W/FUSION Bilateral 07/11/2021   Procedure: ENDOSCOPIC SINUS SURGERY WITH FUSION NAVIGATION;  Surgeon: Osborn Coho, MD;  Location: [facility];  Service: ENT;  Laterality: Bilateral;   SINUS IRRIGATION     TEMPOROMANDIBULAR JOINT SURGERY     TUBAL LIGATION     TURBINATE REDUCTION Bilateral 07/11/2021   Procedure: BILATERAL INFERIOR TURBINATE REDUCTION;  Surgeon: Osborn Coho, MD;  Location: [facility];  Service: ENT;  Laterality: Bilateral;   Social History:  reports that she quit smoking about 5 years ago. Her smoking use included cigarettes. She started smoking about 47 years ago. She has a 21 pack-year smoking history. She has never used smokeless tobacco. She reports that she does not drink alcohol and does not use drugs.  Allergies  Allergen Reactions   Bee Venom Anaphylaxis   Penicillins Anaphylaxis   Dog Epithelium     Other Reaction(s): Other (See Comments)  Reaction to Asthma throat closing   Protonix [Pantoprazole Sodium] Diarrhea    Family History  Problem Relation Age of Onset   Emphysema Mother        never smoked   Irritable bowel syndrome Mother    Hiatal hernia Mother    Other Mother        esophageal stricture   Emphysema Father        smoked   Prostate cancer Father    Breast cancer Paternal Aunt    Asthma Daughter    Asthma Son    Asthma Grandchild    Cirrhosis Maternal Uncle        alcohol related x 6 uncles   Esophageal cancer Neg Hx    Colon cancer Neg Hx    Pancreatic cancer Neg Hx    Stomach cancer Neg Hx     Prior to Admission medications   Medication Sig Start Date End Date Taking? Authorizing Provider  acetaminophen (TYLENOL) 325 MG tablet Take 325 mg by mouth every 6 (six) hours as needed for moderate pain or headache.    [provider]  albuterol (ACCUNEB) 0.63 MG/3ML nebulizer solution Take 1 ampule by nebulization 2 (two) times daily. 05/26/23   [provider]  albuterol (PROVENTIL) (2.5 MG/3ML) 0.083% nebulizer solution Take 2.5 mg by nebulization every 4 (four) hours as needed for wheezing or shortness of breath. PLAN C     [provider]  albuterol (VENTOLIN HFA) 108 (90 Base) MCG/ACT inhaler Inhale 2 puffs into the lungs every 4 (four) hours as needed for wheezing or shortness of breath. PLAN B    [provider]  amLODipine (NORVASC) 10 MG tablet Take 10 mg by mouth daily. 04/29/22   [provider]  Artificial Tear Ointment (DRY EYES OP) Place 1 drop into both eyes daily as needed (dry eyes).    [provider]  atorvastatin (LIPITOR) 10 MG tablet Take 10 mg by mouth daily.    [provider]  baclofen (LIORESAL) 20 MG tablet Take 20 mg by mouth 4 (four) times daily.    [provider]  budesonide (PULMICORT) 0.25 MG/2ML nebulizer solution Take 0.25 mg by  nebulization daily. 11/07/20   [provider]  clonazePAM (KLONOPIN) 1 MG tablet Take 2 mg by mouth at bedtime.    [provider]  cloNIDine (CATAPRES) 0.1 MG tablet Take 0.1 mg by mouth daily as needed (High blood pressure). 06/05/22   [provider]  cloNIDine (CATAPRES) 0.1 MG tablet Take 0.1 mg by mouth daily. 08/31/13   [provider]  cyclobenzaprine (FLEXERIL) 10 MG tablet Take 10 mg by mouth 2 (two) times daily.    [provider]  disulfiram (ANTABUSE) 250 MG tablet Take 250 mg by mouth daily.    [provider]  doxycycline (VIBRA-TABS) 100 MG tablet Take 100 mg by mouth daily.    [provider]  EPINEPHrine 0.3 mg/0.3 mL IJ SOAJ injection Inject 0.3 mg into the muscle as needed for anaphylaxis.    [provider]  estradiol (ESTRACE) 0.5 MG tablet Take 0.5 mg by mouth daily.    [provider]  famotidine (PEPCID) 20 MG tablet TAKE 2 TABLETS BY MOUTH DAILY AT BEDTIME 10/09/22   Iva Boop, MD  famotidine (PEPCID) 20 MG tablet Take 20 mg by mouth at bedtime. 03/14/21   [provider]  FIBER ADULT GUMMIES PO Take 1 each by mouth in the morning and at bedtime.    [provider]  fluticasone  (FLONASE) 50 MCG/ACT nasal spray Place 1 spray into both nostrils 3 (three) times daily. 07/24/21   [provider]  fluticasone (FLONASE) 50 MCG/ACT nasal spray Place 2 sprays into both nostrils daily. 11/26/16 10/16/23  [provider]  fluticasone-salmeterol (ADVAIR HFA) 115-21 MCG/ACT inhaler Inhale 2 puffs into the lungs 2 (two) times daily. 10/11/17   [provider]  furosemide (LASIX) 20 MG tablet Take 20 mg by mouth daily. 05/18/14   [provider]  HYDROcodone-acetaminophen (NORCO) 7.5-325 MG tablet Take 1 tablet by mouth in the morning, at noon, and at bedtime.    [provider]  ibuprofen (ADVIL) 800 MG tablet Take 800 mg by mouth every 8 (eight) hours as needed for fever, headache, moderate pain or mild pain. 10/13/17   [provider]  ipratropium (ATROVENT) 0.02 % nebulizer solution Take 0.5 mg by nebulization every 6 (six) hours as needed for wheezing or shortness of breath. 05/26/23   [provider]  lisinopril-hydrochlorothiazide (ZESTORETIC) 10-12.5 MG tablet Take 1 tablet by mouth daily. 07/31/23   [provider]  loteprednol (LOTEMAX) 0.5 % ophthalmic suspension     [provider]  medroxyPROGESTERone (PROVERA) 10 MG tablet Take 10 mg by mouth daily.    [provider]  methylPREDNISolone (MEDROL) 4 MG tablet Take 4 mg by mouth 2 (two) times daily. 03/31/23   [provider]  mexiletine (MEXITIL) 150 MG capsule Take 150 mg by mouth as directed.    [provider]  montelukast (SINGULAIR) 10 MG tablet Take 10 mg by mouth at bedtime.    [provider]  pantoprazole (PROTONIX) 40 MG tablet Take 40 mg by mouth 2 (two) times daily. 12/08/17   [provider]  predniSONE (DELTASONE) 10 MG tablet Take 10 mg by mouth as directed.  TAKE 4 EACH AM X 2 DAYS, 2 EACH AM X 2 DAYS, 1 EACH AM X 2 DAYS AND STOP    [provider]  QUININE SULFATE PO Take 300 mg by  mouth daily. Ordered from [place]    [provider]  Respiratory Therapy Supplies (FLUTTER) DEVI Use as directed  11/05/17   Nyoka Cowden, MD  rosuvastatin (CRESTOR) 20 MG tablet Take 20 mg by mouth daily.    [provider]  simvastatin (ZOCOR) 20 MG tablet Take 20 mg by mouth daily at 6 (six) AM.    [provider]  simvastatin (ZOCOR) 40 MG tablet Take 40 mg by mouth daily at 6 (six) AM.    [provider]  sodium chloride (OCEAN) 0.65 % nasal spray Place 1 spray into both nostrils as needed (to keep moisture in nose). 03/05/18   [provider]  Tiotropium Bromide-Olodaterol (STIOLTO RESPIMAT) 2.5-2.5 MCG/ACT AERS Inhale 2 puffs into the lungs daily. 04/10/23   Nyoka Cowden, MD  varenicline (CHANTIX) 1 MG tablet Take 1 mg by mouth daily.    [provider]  Rayburn Ma Oil (ULTRA FRESH PM) OINT Apply 1 drop to eye as needed. 10/23/21   [provider]   Patient has not been taking any of her home medications for the last week Physical Exam: Vitals:   09/19/23 1915 09/19/23 1930 09/19/23 1940 09/19/23 1945  BP: 94/74 (!) 87/69 91/69 (!) 94/54  Pulse: (!) 108 (!) 103 (!) 106 (!) 104  Resp: 19 (!) 21 18 18   Temp:      TempSrc:      SpO2: 98% 98% 98% 98%  Weight:      Height:       Dental: Patient is alert awake oriented x 3 no distress Respiratory exam: Diffusely diminished breath sounds occasional expiratory wheeze Cardiovascular exam S1-S2 normal no nodule venous distention abdomen soft nontender Extremities warm without edema Limited nasal exm wnl. Data Reviewed:  Labs on Admission:  Results for orders placed or performed during the hospital encounter of 09/19/23 (from the past 24 hour(s))  Comprehensive metabolic panel     Status: Abnormal   Collection Time: 09/19/23  5:52 PM  Result Value Ref Range   Sodium 141 135 - 145 mmol/L   Potassium 3.2 (L) 3.5 - 5.1 mmol/L   Chloride 106 98 - 111 mmol/L    CO2 24 22 - 32 mmol/L   Glucose, Bld 146 (H) 70 - 99 mg/dL   BUN 7 6 - 20 mg/dL   Creatinine, Ser 7.82 0.44 - 1.00 mg/dL   Calcium 8.9 8.9 - 95.6 mg/dL   Total Protein 7.4 6.5 - 8.1 g/dL   Albumin 3.6 3.5 - 5.0 g/dL   AST 21 15 - 41 U/L   ALT 18 0 - 44 U/L   Alkaline Phosphatase 118 38 - 126 U/L   Total Bilirubin 0.5 0.3 - 1.2 mg/dL   GFR, Estimated >21 >30 mL/min   Anion gap 11 5 - 15  CBC with Differential     Status: Abnormal   Collection Time: 09/19/23  5:52 PM  Result Value Ref Range   WBC 9.6 4.0 - 10.5 K/uL   RBC 3.85 (L) 3.87 - 5.11 MIL/uL   Hemoglobin 12.2 12.0 - 15.0 g/dL   HCT 86.5 78.4 - 69.6 %   MCV 93.5 80.0 - 100.0 fL   MCH 31.7 26.0 - 34.0 pg   MCHC 33.9 30.0 - 36.0 g/dL   RDW 29.5 28.4 - 13.2 %   Platelets 262 150 - 400 K/uL   nRBC 0.0 0.0 - 0.2 %   Neutrophils Relative % 83 %   Neutro Abs 8.0 (H) 1.7 - 7.7 K/uL   Lymphocytes Relative 6 %   Lymphs Abs 0.5 (L) 0.7 - 4.0 K/uL  Monocytes Relative 11 %   Monocytes Absolute 1.0 0.1 - 1.0 K/uL   Eosinophils Relative 0 %   Eosinophils Absolute 0.0 0.0 - 0.5 K/uL   Basophils Relative 0 %   Basophils Absolute 0.0 0.0 - 0.1 K/uL   Immature Granulocytes 0 %   Abs Immature Granulocytes 0.03 0.00 - 0.07 K/uL  Urinalysis, w/ Reflex to Culture (Infection Suspected) -Urine, Clean Catch     Status: Abnormal   Collection Time: 09/19/23  5:52 PM  Result Value Ref Range   Specimen Source URINE, CLEAN CATCH    Color, Urine YELLOW YELLOW   APPearance CLEAR CLEAR   Specific Gravity, Urine 1.019 1.005 - 1.030   pH 5.0 5.0 - 8.0   Glucose, UA NEGATIVE NEGATIVE mg/dL   Hgb urine dipstick SMALL (A) NEGATIVE   Bilirubin Urine NEGATIVE NEGATIVE   Ketones, ur NEGATIVE NEGATIVE mg/dL   Protein, ur 30 (A) NEGATIVE mg/dL   Nitrite NEGATIVE NEGATIVE   Leukocytes,Ua NEGATIVE NEGATIVE   RBC / HPF 0-5 0 - 5 RBC/hpf   WBC, UA 0-5 0 - 5 WBC/hpf   Bacteria, UA NONE SEEN NONE SEEN   Squamous Epithelial / HPF 0-5 0 - 5 /HPF   Mucus  PRESENT    Hyaline Casts, UA PRESENT   Troponin I (High Sensitivity)     Status: None   Collection Time: 09/19/23  5:52 PM  Result Value Ref Range   Troponin I (High Sensitivity) 5 <18 ng/L  Brain natriuretic peptide     Status: Abnormal   Collection Time: 09/19/23  5:52 PM  Result Value Ref Range   B Natriuretic Peptide 151.2 (H) 0.0 - 100.0 pg/mL  I-Stat Lactic Acid, ED     Status: None   Collection Time: 09/19/23  6:05 PM  Result Value Ref Range   Lactic Acid, Venous 1.5 0.5 - 1.9 mmol/L  Troponin I (High Sensitivity)     Status: None   Collection Time: 09/19/23  7:16 PM  Result Value Ref Range   Troponin I (High Sensitivity) 5 <18 ng/L  I-Stat Lactic Acid, ED     Status: None   Collection Time: 09/19/23  7:29 PM  Result Value Ref Range   Lactic Acid, Venous 1.5 0.5 - 1.9 mmol/L   Basic Metabolic Panel: Recent Labs  Lab 09/19/23 1752  NA 141  K 3.2*  CL 106  CO2 24  GLUCOSE 146*  BUN 7  CREATININE 0.73  CALCIUM 8.9   Liver Function Tests: Recent Labs  Lab 09/19/23 1752  AST 21  ALT 18  ALKPHOS 118  BILITOT 0.5  PROT 7.4  ALBUMIN 3.6   No results for input(s): "LIPASE", "AMYLASE" in the last 168 hours. No results for input(s): "AMMONIA" in the last 168 hours. CBC: Recent Labs  Lab 09/19/23 1752  WBC 9.6  NEUTROABS 8.0*  HGB 12.2  HCT 36.0  MCV 93.5  PLT 262   Cardiac Enzymes: Recent Labs  Lab 09/19/23 1752 09/19/23 1916  TROPONINIHS 5 5    BNP (last 3 results) No results for input(s): "PROBNP" in the last 8760 hours. CBG: No results for input(s): "GLUCAP" in the last 168 hours.  Radiological Exams on Admission:  CT Angio Chest PE W/Cm &/Or Wo Cm  Result Date: 09/19/2023 CLINICAL DATA:  Shortness of breath EXAM: CT ANGIOGRAPHY CHEST WITH CONTRAST TECHNIQUE: Multidetector CT imaging of the chest was performed using the standard protocol during bolus administration of intravenous contrast. Multiplanar CT image reconstructions and MIPs  were  obtained to evaluate the vascular anatomy. RADIATION DOSE REDUCTION: This exam was performed according to the departmental dose-optimization program which includes automated exposure control, adjustment of the mA and/or kV according to patient size and/or use of iterative reconstruction technique. CONTRAST:  75mL OMNIPAQUE IOHEXOL 350 MG/ML SOLN COMPARISON:  Chest x-ray 927 2024 FINDINGS: Cardiovascular: Satisfactory opacification of the pulmonary arteries to the segmental level. Attenuated vasculature in the right upper lobe. No evidence of pulmonary embolism. Normal heart size. No pericardial effusion. Nonaneurysmal aorta. Moderate atherosclerosis. No dissection. Mediastinum/Nodes: Midline trachea. No thyroid mass. No suspicious lymph nodes. Esophagus within normal limits. Lungs/Pleura: Advanced emphysema with large right greater than left apical bulla and hyperexpansion of the right upper lobe. No acute airspace disease, pleural effusion or pneumothorax. Upper Abdomen: No acute finding Musculoskeletal: No acute or suspicious osseous abnormality Review of the MIP images confirms the above findings. IMPRESSION: 1. Negative for acute pulmonary embolus. 2. Advanced emphysema with large right greater than left apical bulla and hyperexpansion of the right upper lobe. No pneumothorax Aortic Atherosclerosis (ICD10-I70.0) and Emphysema (ICD10-J43.9). Electronically Signed   By: Jasmine Pang M.D.   On: 09/19/2023 20:19   DG Chest 2 View  Result Date: 09/19/2023 CLINICAL DATA:  Shortness of breath and congestion. EXAM: CHEST - 2 VIEW COMPARISON:  02/03/2019 FINDINGS: Lungs are markedly hyperexpanded. Lucency in the lung apices is progressive in the interval suggesting bullous changes superimposed on emphysema. Vertical linear density in the right upper lung raises the question of a pneumothorax given the background lucency. If this is a pneumothorax, orientation would suggest that a portion of the right upper lobe is  scarred to the chest wall. Alternatively, this could be the wall of the large bulla. Overlying sheet or clothing could also generate this appearance. Dense airspace consolidation is seen at the right base. No substantial pleural effusion. No acute bony abnormality. Telemetry leads overlie the chest. IMPRESSION: 1. Marked hyperexpansion with progressive lucency in the lung apices suggesting bullous changes superimposed on emphysema. 2. Vertical linear density in the right upper lung raises the question of a pneumothorax given the background lucency. If this is a pneumothorax, orientation of this pleural line would suggest that a portion of the right upper lobe is scarred to the chest wall. Alternatively, this could be the wall of the large bulla. Overlying sheet or clothing could also generate this appearance. CT chest without contrast would likely prove quite helpful to further evaluate. 3. Dense airspace consolidation at the right base compatible with pneumonia. Electronically Signed   By: Kennith Center M.D.   On: 09/19/2023 18:31    EKG: Independently reviewed. Low voltage sinus tachycardia.   Assessment and Plan: * COPD exacerbation (HCC) See HPI for baseline functional status.  Patient has had increasing shortness of breath for the last 7 to 10 days with rather complete prostration for the last 24 hours.  Found to be hypoxic on exertion in the ER.  Patient is also having cough with greenish expectoration.  Although patient attributes this to sinus nasal discharge.  We will check a sputum with culture.  I do not think blood cultures would be helpful.  I will start the patient on prednisone, as needed DuoNebs and levofloxacin.  Start inhaled steroids.  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 There is no recent CT cehst to compare it to , cannot ascertain the timeline of onset/prgression. Patient sees [facility] as outpatient - last  visit 7 months ago. Consider  consult in AM if needed.  Hypotension I would call it more of a soft BP. This is noted in incidentally.  I am not sure if patient is symptomatic from this.  It is possible that it is contributing though to the patient's current symptoms of marked fatigue and sensation of shortness of breath.  Troponin is negative, I will pursue an echo, get a cortisol level in the morning.  And also TSH.  I will certainly hold patient's antihypertensive regimen.  Which patient has been holding for the last few days.  I will give her an empiric fluid bolus (patient arelady received a liter in ER). Check lactic aicd. Check orthostatic vitals.   Home med rec pending home med collection. Patinet has not been tking meds for 7 days.   Advance Care Planning:   Code Status: Full Code   Consults: consider pulm eval in AM if needed.  Family Communication: per patient.  Severity of Illness: The appropriate patient status for this patient is INPATIENT. Inpatient status is judged to be reasonable and necessary in order to provide the required intensity of service to ensure the patient's safety. The patient's presenting symptoms, physical exam findings, and initial radiographic and laboratory data in the context of their chronic comorbidities is felt to place them at high risk for further clinical deterioration. Furthermore, it is not anticipated that the patient will be medically stable for discharge from the hospital within 2 midnights of admission.   * I certify that at the point of admission it is my clinical judgment that the patient will require inpatient hospital care spanning beyond 2 midnights from the point of admission due to high intensity of service, high risk for further deterioration and high frequency of surveillance required.*  Author: Nolberto Hanlon, MD 09/19/2023 9:23 PM  For on call review [email or web address].

## 2023-09-19 NOTE — ED Triage Notes (Signed)
Sinus congestion/infection diagnosed by PCP 2 weeks ago. Pt went to UC today and they sent pt here due to low O2 saturation. Pt 90% on RA, when talking drops to 88%. Pt does have shallow, labored breathing. Hx of COPD

## 2023-09-19 NOTE — Assessment & Plan Note (Addendum)
See HPI for baseline functional status.  Patient has had increasing shortness of breath for the last 7 to 10 days with rather complete prostration for the last 24 hours.  Found to be hypoxic on exertion in the ER.  Patient is also having cough with greenish expectoration.  Although patient attributes this to sinus nasal discharge.  We will check a sputum with culture.  I do not think blood cultures would be helpful.  I will start the patient on prednisone, as needed DuoNebs and levofloxacin.  Start inhaled steroids.  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 There is no recent CT cehst to compare it to , cannot ascertain the timeline of onset/prgression. Patient sees [facility] as outpatient - last visit 7 months ago. Consider consult in AM if needed.

## 2023-09-19 NOTE — ED Notes (Signed)
RN to room , pt bp dropping, RN made MD aware Face to face, MD ordered fluids, MD to bedside, see additional orders

## 2023-09-19 NOTE — ED Notes (Signed)
Patient is being discharged from the Urgent Care and sent to the Emergency Department via pov . Per Erma Pinto, PA-C, patient is in need of higher level of care due to [facility] and decreased O2 sats. Patient is aware and verbalizes understanding of plan of care.  Vitals:   09/19/23 1602 09/19/23 1612  BP:    Pulse: (!) 110 (!) 122  Resp: (!) 32 (!) 30  Temp:    SpO2: 98% 92%

## 2023-09-19 NOTE — ED Provider Notes (Signed)
[name]    CSN: [id number] Arrival date & time: 09/19/23  1538      History   Chief Complaint Chief Complaint  Patient presents with   Cough    Entered by patient    HPI Tracy Gray is a 57 y.o. female.   Patient here today for evaluation of significant shortness of breath, cough and congestion.  She notes that symptoms started about 2 weeks ago but have worsened over the last 24 hours.  She does have known COPD.  She states that she does have a nebulizer but it broke and was unable to get a refill.    The history is provided by the patient.  Cough Associated symptoms: no chills, no eye discharge and no fever     Past Medical History:  Diagnosis Date   Adenomatous colon polyp 03/26/2018   3 adenomas, max 10mm   Allergy    Anxiety    Asthma    Chronic headache    COPD (chronic obstructive pulmonary disease) (HCC)    Degenerative disc disease    Fibromyalgia    GERD (gastroesophageal reflux disease)    Hyperlipidemia    Hypertension    Multiple sclerosis (HCC)    Nerve damage    OSA (obstructive sleep apnea)    not on CPAP    Sleep apnea     Patient Active Problem List   Diagnosis Date Noted   Former cigarette  smoker 05/13/2022   Foreign body in throat 08/25/2021   Thrush 02/07/2020   GERD (gastroesophageal reflux disease) 07/21/2019   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   Dysphagia    COPD exacerbation (HCC) 07/16/2016   Obstructive sleep apnea 05/10/2016   COPD  GOLD III with min reversiblity  03/01/2016   Sinusitis, chronic 03/01/2016   Cigarette smoker 02/03/2016   Upper airway cough syndrome 02/02/2016   Dyspnea 02/02/2016    Past Surgical History:  Procedure Laterality Date   APPENDECTOMY     ARTHROCENTESIS INJECTION Left 06/14/2022   Procedure: LEFT ARTHROCENTESIS INJECTION;  Surgeon: Ocie Doyne, DMD;  Location: [facility] OR;  Service: Oral Surgery;  Laterality: Left;  jaw   ARTHROTOMY Right 06/14/2022    Procedure: RIGHT TEMPORAL MANDIBULAR JOINT ARTHROTOMY WITH MENISECTOMY AND FAT GRAFT;  Surgeon: Ocie Doyne, DMD;  Location: [facility] OR;  Service: Oral Surgery;  Laterality: Right;   COLONOSCOPY     DILATION AND CURETTAGE OF UTERUS     DIRECT LARYNGOSCOPY N/A 08/25/2021   Procedure: DIRECT LARYNGOSCOPY AND ESOPHAGOSCOPY, REMOVAL OF FOREIGN BODY;  Surgeon: Osborn Coho, MD;  Location: [facility] OR;  Service: ENT;  Laterality: N/A;   ECTOPIC PREGNANCY SURGERY     ESOPHAGEAL MANOMETRY N/A 09/07/2018   Procedure: ESOPHAGEAL MANOMETRY (EM);  Surgeon: Napoleon Form, MD;  Location: [facility];  Service: Endoscopy;  Laterality: N/A;   ESOPHAGOGASTRODUODENOSCOPY     EXPLORATORY LAPAROTOMY     SINUS ENDO W/FUSION Bilateral 07/11/2021   Procedure: ENDOSCOPIC SINUS SURGERY WITH FUSION NAVIGATION;  Surgeon: Osborn Coho, MD;  Location: [facility];  Service: ENT;  Laterality: Bilateral;   SINUS IRRIGATION     TEMPOROMANDIBULAR JOINT SURGERY     TUBAL LIGATION     TURBINATE REDUCTION Bilateral 07/11/2021   Procedure: BILATERAL INFERIOR TURBINATE REDUCTION;  Surgeon: Osborn Coho, MD;  Location: [facility];  Service: ENT;  Laterality: Bilateral;    OB History   No obstetric history on file.      Home Medications  Prior to Admission medications   Medication Sig Start Date End Date Taking? Authorizing Provider  albuterol (ACCUNEB) 0.63 MG/3ML nebulizer solution Take 1 ampule by nebulization 2 (two) times daily. 05/26/23  Yes [provider]  albuterol (PROVENTIL) (2.5 MG/3ML) 0.083% nebulizer solution Take 2.5 mg by nebulization every 4 (four) hours as needed for wheezing or shortness of breath. PLAN C   Yes [provider]  albuterol (VENTOLIN HFA) 108 (90 Base) MCG/ACT inhaler Inhale 2 puffs into the lungs every 4 (four) hours as needed for wheezing or shortness of breath. PLAN B   Yes [provider]  acetaminophen (TYLENOL) 325 MG  tablet Take 325 mg by mouth every 6 (six) hours as needed for moderate pain or headache.    [provider]  amLODipine (NORVASC) 10 MG tablet Take 10 mg by mouth daily. 04/29/22   [provider]  Artificial Tear Ointment (DRY EYES OP) Place 1 drop into both eyes daily as needed (dry eyes).    [provider]  atorvastatin (LIPITOR) 10 MG tablet Take 10 mg by mouth daily.    [provider]  baclofen (LIORESAL) 20 MG tablet Take 20 mg by mouth 4 (four) times daily.    [provider]  budesonide (PULMICORT) 0.25 MG/2ML nebulizer solution Take 0.25 mg by nebulization daily. 11/07/20   [provider]  clonazePAM (KLONOPIN) 1 MG tablet Take 2 mg by mouth at bedtime.    [provider]  cloNIDine (CATAPRES) 0.1 MG tablet Take 0.1 mg by mouth daily as needed (High blood pressure). 06/05/22   [provider]  cloNIDine (CATAPRES) 0.1 MG tablet Take 0.1 mg by mouth daily. 08/31/13   [provider]  cyclobenzaprine (FLEXERIL) 10 MG tablet Take 10 mg by mouth 2 (two) times daily.    [provider]  disulfiram (ANTABUSE) 250 MG tablet Take 250 mg by mouth daily.    [provider]  doxycycline (VIBRA-TABS) 100 MG tablet Take 100 mg by mouth daily.    [provider]  EPINEPHrine 0.3 mg/0.3 mL IJ SOAJ injection Inject 0.3 mg into the muscle as needed for anaphylaxis.    [provider]  estradiol (ESTRACE) 0.5 MG tablet Take 0.5 mg by mouth daily.    [provider]  famotidine (PEPCID) 20 MG tablet TAKE 2 TABLETS BY MOUTH DAILY AT BEDTIME 10/09/22   Tracy Boop, MD  famotidine (PEPCID) 20 MG tablet Take 20 mg by mouth at bedtime. 03/14/21   [provider]  FIBER ADULT GUMMIES PO Take 1 each by mouth in the morning and at bedtime.    [provider]  fluticasone (FLONASE) 50 MCG/ACT nasal spray Place 1 spray into both nostrils 3 (three) times daily. 07/24/21    [provider]  fluticasone (FLONASE) 50 MCG/ACT nasal spray Place 2 sprays into both nostrils daily. 11/26/16 10/16/23  [provider]  fluticasone-salmeterol (ADVAIR HFA) 115-21 MCG/ACT inhaler Inhale 2 puffs into the lungs 2 (two) times daily. 10/11/17   [provider]  furosemide (LASIX) 20 MG tablet Take 20 mg by mouth daily. 05/18/14   [provider]  HYDROcodone-acetaminophen (NORCO) 7.5-325 MG tablet Take 1 tablet by mouth in the morning, at noon, and at bedtime.    [provider]  ibuprofen (ADVIL) 800 MG tablet Take 800 mg by mouth every 8 (eight) hours as needed for fever, headache, moderate pain or mild pain. 10/13/17   [provider]  ipratropium (ATROVENT) 0.02 %  nebulizer solution Take 0.5 mg by nebulization every 6 (six) hours as needed for wheezing or shortness of breath. 05/26/23   [provider]  lisinopril-hydrochlorothiazide (ZESTORETIC) 10-12.5 MG tablet Take 1 tablet by mouth daily. 07/31/23   [provider]  loteprednol (LOTEMAX) 0.5 % ophthalmic suspension     [provider]  medroxyPROGESTERone (PROVERA) 10 MG tablet Take 10 mg by mouth daily.    [provider]  methylPREDNISolone (MEDROL) 4 MG tablet Take 4 mg by mouth 2 (two) times daily. 03/31/23   [provider]  mexiletine (MEXITIL) 150 MG capsule Take 150 mg by mouth as directed.    [provider]  montelukast (SINGULAIR) 10 MG tablet Take 10 mg by mouth at bedtime.    [provider]  pantoprazole (PROTONIX) 40 MG tablet Take 40 mg by mouth 2 (two) times daily. 12/08/17   [provider]  predniSONE (DELTASONE) 10 MG tablet Take 10 mg by mouth as directed.  TAKE 4 EACH AM X 2 DAYS, 2 EACH AM X 2 DAYS, 1 EACH AM X 2 DAYS AND STOP    [provider]  QUININE SULFATE PO Take 300 mg by mouth daily. Ordered from [place]    [provider]  Respiratory Therapy Supplies (FLUTTER)  DEVI Use as directed 11/05/17   Nyoka Cowden, MD  rosuvastatin (CRESTOR) 20 MG tablet Take 20 mg by mouth daily.    [provider]  simvastatin (ZOCOR) 20 MG tablet Take 20 mg by mouth daily at 6 (six) AM.    [provider]  simvastatin (ZOCOR) 40 MG tablet Take 40 mg by mouth daily at 6 (six) AM.    [provider]  sodium chloride (OCEAN) 0.65 % nasal spray Place 1 spray into both nostrils as needed (to keep moisture in nose). 03/05/18   [provider]  Tiotropium Bromide-Olodaterol (STIOLTO RESPIMAT) 2.5-2.5 MCG/ACT AERS Inhale 2 puffs into the lungs daily. 04/10/23   Nyoka Cowden, MD  varenicline (CHANTIX) 1 MG tablet Take 1 mg by mouth daily.    [provider]  Rayburn Ma Oil (ULTRA FRESH PM) OINT Apply 1 drop to eye as needed. 10/23/21   [provider]    Family History Family History  Problem Relation Age of Onset   Emphysema Mother        never smoked   Irritable bowel syndrome Mother    Hiatal hernia Mother    Other Mother        esophageal stricture   Emphysema Father        smoked   Prostate cancer Father    Breast cancer Paternal Aunt    Asthma Daughter    Asthma Son    Asthma Grandchild    Cirrhosis Maternal Uncle        alcohol related x 6 uncles   Esophageal cancer Neg Hx    Colon cancer Neg Hx    Pancreatic cancer Neg Hx    Stomach cancer Neg Hx     Social History Social History   Tobacco Use   Smoking status: Former    Current packs/day: 0.00    Average packs/day: 0.5 packs/day for 42.0 years (21.0 ttl pk-yrs)    Types: Cigarettes    Start date: 06/03/1976    Quit date: 06/03/2018    Years since quitting: 5.2   Smokeless tobacco: Never   Tobacco comments:       Vaping Use   Vaping status: Former  Quit date: 12/23/2018  Substance Use Topics   Alcohol use: No    Alcohol/week: 0.0 standard drinks of alcohol   Drug use: No     Allergies   Bee venom, Penicillins, Dog  epithelium, and Protonix [pantoprazole sodium]   Review of Systems Review of Systems  Constitutional:  Negative for chills and fever.  Eyes:  Negative for discharge and redness.  Respiratory:  Positive for cough.   Gastrointestinal:  Negative for abdominal pain, nausea and vomiting.  Genitourinary:  Positive for vaginal bleeding and vaginal discharge.     Physical Exam Triage Vital Signs ED Triage Vitals  Encounter Vitals Group     BP 09/19/23 1540 (!) 155/63     Systolic BP Percentile --      Diastolic BP Percentile --      Pulse Rate 09/19/23 1540 (!) 118     Resp 09/19/23 1540 (!) 42     Temp 09/19/23 1540 98.7 F (37.1 C)     Temp Source 09/19/23 1540 Oral     SpO2 09/19/23 1540 (!) 88 %     Weight 09/19/23 1552 154 lb 15.7 oz (70.3 kg)     Height 09/19/23 1552 5\' 4"  (1.626 m)     Head Circumference --      Peak Flow --      Pain Score 09/19/23 1550 0     Pain Loc --      Pain Education --      Exclude from Growth Chart --    No data found.  Updated Vital Signs BP (!) 150/70 (BP Location: Right Arm)   Pulse (!) 122   Temp 98.7 F (37.1 C) (Oral)   Resp (!) 30   Ht 5\' 4"  (1.626 m)   Wt 154 lb 15.7 oz (70.3 kg)   SpO2 92%   BMI 26.60 kg/m      Physical Exam Vitals and nursing note reviewed.  Constitutional:      General: She is not in acute distress.    Appearance: Normal appearance. She is not ill-appearing.  HENT:     Head: Normocephalic and atraumatic.  Eyes:     Conjunctiva/sclera: Conjunctivae normal.  Cardiovascular:     Rate and Rhythm: Tachycardia present.  Pulmonary:     Effort: Respiratory distress present.     Breath sounds: No wheezing, rhonchi or rales.     Comments: Breath sounds diminished throughout Neurological:     Mental Status: She is alert.  Psychiatric:        Mood and Affect: Mood normal.        Behavior: Behavior normal.        Thought Content: Thought content normal.      UC Treatments / Results  Labs (all labs  ordered are listed, but only abnormal results are displayed) Labs Reviewed - No data to display  EKG   Radiology No results found.  Procedures Procedures (including critical care time)  Medications Ordered in UC Medications  ipratropium-albuterol (DUONEB) 0.5-2.5 (3) MG/3ML nebulizer solution 3 mL (3 mLs Nebulization Given 09/19/23 1557)    Initial Impression / Assessment and Plan / UC Course  I have reviewed the triage vital signs and the nursing notes.  Pertinent labs & imaging results that were available during my care of the patient were reviewed by me and considered in my medical decision making (see chart for details).    Mild improvement after DuoNeb treatment but patient remained tachypneic and oxygen saturation dropped to  90 at rest.  Recommended further evaluation in the emergency room given continued dyspnea, tachypnea.  Patient is agreeable to same and will have daughter who is here with her to transport her to local ED.  Final Clinical Impressions(s) / UC Diagnoses   Final diagnoses:  COPD exacerbation [facility])   Discharge Instructions   None    ED Prescriptions   None    PDMP not reviewed this encounter.   Tomi Bamberger, PA-C 09/19/23 [phone]

## 2023-09-19 NOTE — ED Provider Notes (Signed)
[place] EMERGENCY DEPARTMENT AT [facility] Provider Note   CSN: [id number] Arrival date & time: 09/19/23  1642     History  Chief Complaint  Patient presents with   Shortness of Breath    Tracy Gray is a 57 y.o. female.  HPI Patient reports she has history of COPD but is not on home oxygen.  She quit smoking about 4 years ago.  She does have a home nebulizer machine which she typically uses but broke recently.  She has been trying to use her inhaler at home but was getting increasingly short of breath.  Patient reports that she thinks it has a lot to do with sinus problems which she identifies as chronic.  She reports she gets a lot of nasal drainage and fluid coming from her nasal passages into the back of her throat at which point in time she goes into cough paroxysmal's and starts feeling short of breath.  Patient reports she has gotten very short of breath just with minimal activity at home such as walking from her bedroom to the bathroom which is a very short distance.  She denies she is really have much chest pain.  She reports when she coughs sometimes she is experiencing some chest pain on the right but no ongoing chest pain.  No fever that she is documented.  She reports her legs were pretty swollen last week but are less swollen this week.  Patient was seen at urgent care and referred to the emergency department for further evaluation and management.    Home Medications Prior to Admission medications   Medication Sig Start Date End Date Taking? Authorizing Provider  acetaminophen (TYLENOL) 325 MG tablet Take 325 mg by mouth every 6 (six) hours as needed for moderate pain or headache.    [provider]  albuterol (ACCUNEB) 0.63 MG/3ML nebulizer solution Take 1 ampule by nebulization 2 (two) times daily. 05/26/23   [provider]  albuterol (PROVENTIL) (2.5 MG/3ML) 0.083% nebulizer solution Take 2.5 mg by nebulization every 4 (four) hours as  needed for wheezing or shortness of breath. PLAN C    [provider]  albuterol (VENTOLIN HFA) 108 (90 Base) MCG/ACT inhaler Inhale 2 puffs into the lungs every 4 (four) hours as needed for wheezing or shortness of breath. PLAN B    [provider]  amLODipine (NORVASC) 10 MG tablet Take 10 mg by mouth daily. 04/29/22   [provider]  Artificial Tear Ointment (DRY EYES OP) Place 1 drop into both eyes daily as needed (dry eyes).    [provider]  atorvastatin (LIPITOR) 10 MG tablet Take 10 mg by mouth daily.    [provider]  baclofen (LIORESAL) 20 MG tablet Take 20 mg by mouth 4 (four) times daily.    [provider]  budesonide (PULMICORT) 0.25 MG/2ML nebulizer solution Take 0.25 mg by nebulization daily. 11/07/20   [provider]  clonazePAM (KLONOPIN) 1 MG tablet Take 2 mg by mouth at bedtime.    [provider]  cloNIDine (CATAPRES) 0.1 MG tablet Take 0.1 mg by mouth daily as needed (High blood pressure). 06/05/22   [provider]  cloNIDine (CATAPRES) 0.1 MG tablet Take 0.1 mg by mouth daily. 08/31/13   [provider]  cyclobenzaprine (FLEXERIL) 10 MG tablet Take 10 mg by mouth 2 (two) times daily.    [provider]  disulfiram (ANTABUSE) 250 MG tablet Take 250 mg by mouth daily.  [provider]  doxycycline (VIBRA-TABS) 100 MG tablet Take 100 mg by mouth daily.    [provider]  EPINEPHrine 0.3 mg/0.3 mL IJ SOAJ injection Inject 0.3 mg into the muscle as needed for anaphylaxis.    [provider]  estradiol (ESTRACE) 0.5 MG tablet Take 0.5 mg by mouth daily.    [provider]  famotidine (PEPCID) 20 MG tablet TAKE 2 TABLETS BY MOUTH DAILY AT BEDTIME 10/09/22   Iva Boop, MD  famotidine (PEPCID) 20 MG tablet Take 20 mg by mouth at bedtime. 03/14/21   [provider]  FIBER ADULT GUMMIES PO Take 1 each by mouth in the morning and at  bedtime.    [provider]  fluticasone (FLONASE) 50 MCG/ACT nasal spray Place 1 spray into both nostrils 3 (three) times daily. 07/24/21   [provider]  fluticasone (FLONASE) 50 MCG/ACT nasal spray Place 2 sprays into both nostrils daily. 11/26/16 10/16/23  [provider]  fluticasone-salmeterol (ADVAIR HFA) 115-21 MCG/ACT inhaler Inhale 2 puffs into the lungs 2 (two) times daily. 10/11/17   [provider]  furosemide (LASIX) 20 MG tablet Take 20 mg by mouth daily. 05/18/14   [provider]  HYDROcodone-acetaminophen (NORCO) 7.5-325 MG tablet Take 1 tablet by mouth in the morning, at noon, and at bedtime.    [provider]  ibuprofen (ADVIL) 800 MG tablet Take 800 mg by mouth every 8 (eight) hours as needed for fever, headache, moderate pain or mild pain. 10/13/17   [provider]  ipratropium (ATROVENT) 0.02 % nebulizer solution Take 0.5 mg by nebulization every 6 (six) hours as needed for wheezing or shortness of breath. 05/26/23   [provider]  lisinopril-hydrochlorothiazide (ZESTORETIC) 10-12.5 MG tablet Take 1 tablet by mouth daily. 07/31/23   [provider]  loteprednol (LOTEMAX) 0.5 % ophthalmic suspension     [provider]  medroxyPROGESTERone (PROVERA) 10 MG tablet Take 10 mg by mouth daily.    [provider]  methylPREDNISolone (MEDROL) 4 MG tablet Take 4 mg by mouth 2 (two) times daily. 03/31/23   [provider]  mexiletine (MEXITIL) 150 MG capsule Take 150 mg by mouth as directed.    [provider]  montelukast (SINGULAIR) 10 MG tablet Take 10 mg by mouth at bedtime.    [provider]  pantoprazole (PROTONIX) 40 MG tablet Take 40 mg by mouth 2 (two) times daily. 12/08/17   [provider]  predniSONE (DELTASONE) 10 MG tablet Take 10 mg by mouth as directed.  TAKE 4 EACH AM X 2 DAYS, 2 EACH AM X 2 DAYS, 1 EACH AM X 2 DAYS AND STOP    [provider]  QUININE SULFATE PO Take 300 mg by mouth daily. Ordered from [place]    [provider]  Respiratory Therapy Supplies (FLUTTER) DEVI Use as directed 11/05/17   Nyoka Cowden, MD  rosuvastatin (CRESTOR) 20 MG tablet Take 20 mg by mouth daily.    [provider]  simvastatin (ZOCOR) 20 MG tablet Take 20 mg by mouth daily at 6 (six) AM.    [provider]  simvastatin (ZOCOR) 40 MG tablet Take 40 mg by mouth daily at 6 (six) AM.    [provider]  sodium chloride (OCEAN) 0.65 % nasal spray Place 1 spray into both nostrils as needed (to keep moisture in nose). 03/05/18   [provider]  Tiotropium Bromide-Olodaterol (STIOLTO RESPIMAT) 2.5-2.5 MCG/ACT AERS  Inhale 2 puffs into the lungs daily. 04/10/23   Nyoka Cowden, MD  varenicline (CHANTIX) 1 MG tablet Take 1 mg by mouth daily.    [provider]  Rayburn Ma Oil (ULTRA FRESH PM) OINT Apply 1 drop to eye as needed. 10/23/21   [provider]      Allergies    Bee venom, Penicillins, Dog epithelium, and Protonix [pantoprazole sodium]    Review of Systems   Review of Systems  Physical Exam Updated Vital Signs BP (!) 94/54   Pulse (!) 104   Temp 99.8 F (37.7 C) (Axillary)   Resp 18   Ht 5\' 4"  (1.626 m)   Wt 70.3 kg   SpO2 98%   BMI 26.60 kg/m  Physical Exam Constitutional:      Comments: Patient is alert with clear mental status.  Mild to moderate increased work of breathing but speaking in full sentences.  Nontoxic.  HENT:     Head: Normocephalic and atraumatic.     Mouth/Throat:     Pharynx: Oropharynx is clear.  Eyes:     Extraocular Movements: Extraocular movements intact.  Cardiovascular:     Comments: Borderline tachycardia.  No appreciable rub murmur gallop. Pulmonary:     Comments: Mild to moderate increased work of breathing.  Breath sounds are very soft at the lower half of the lung fields.  There is some fine expiratory wheeze  in mid and upper lung fields. Abdominal:     General: There is no distension.     Palpations: Abdomen is soft.     Tenderness: There is no abdominal tenderness. There is no guarding.  Musculoskeletal:        General: No swelling or tenderness. Normal range of motion.     Right lower leg: No edema.     Left lower leg: No edema.     Comments: Currently no significant edema.  Calves are soft and pliable.  Skin:    General: Skin is warm and dry.  Neurological:     General: No focal deficit present.     Mental Status: She is oriented to person, place, and time.     Motor: No weakness.     Coordination: Coordination normal.  Psychiatric:        Mood and Affect: Mood normal.     ED Results / Procedures / Treatments   Labs (all labs ordered are listed, but only abnormal results are displayed) Labs Reviewed  COMPREHENSIVE METABOLIC PANEL - Abnormal; Notable for the following components:      Result Value   Potassium 3.2 (*)    Glucose, Bld 146 (*)    All other components within normal limits  CBC WITH DIFFERENTIAL/PLATELET - Abnormal; Notable for the following components:   RBC 3.85 (*)    Neutro Abs 8.0 (*)    Lymphs Abs 0.5 (*)    All other components within normal limits  URINALYSIS, W/ REFLEX TO CULTURE (INFECTION SUSPECTED) - Abnormal; Notable for the following components:   Hgb urine dipstick SMALL (*)    Protein, ur 30 (*)    All other components within normal limits  BRAIN NATRIURETIC PEPTIDE - Abnormal; Notable for the following components:   B Natriuretic Peptide 151.2 (*)    All other components within normal limits  I-STAT CG4 LACTIC ACID, ED  I-STAT CG4 LACTIC ACID, ED  TROPONIN I (HIGH SENSITIVITY)  TROPONIN I (HIGH SENSITIVITY)    EKG EKG Interpretation Date/Time:  Friday September 19 2023 16:55:28 EDT Ventricular Rate:  115 PR Interval:  118 QRS Duration:  84 QT Interval:  314 QTC Calculation: 435 R Axis:   81  Text Interpretation: Sinus tachycardia  higher voltage since previous but no other sig change. no acute ischemic appearance. Confirmed by Arby Barrette [phone]) on 09/19/2023 5:28:53 PM  Radiology CT Angio Chest PE W/Cm &/Or Wo Cm  Result Date: 09/19/2023 CLINICAL DATA:  Shortness of breath EXAM: CT ANGIOGRAPHY CHEST WITH CONTRAST TECHNIQUE: Multidetector CT imaging of the chest was performed using the standard protocol during bolus administration of intravenous contrast. Multiplanar CT image reconstructions and MIPs were obtained to evaluate the vascular anatomy. RADIATION DOSE REDUCTION: This exam was performed according to the departmental dose-optimization program which includes automated exposure control, adjustment of the mA and/or kV according to patient size and/or use of iterative reconstruction technique. CONTRAST:  75mL OMNIPAQUE IOHEXOL 350 MG/ML SOLN COMPARISON:  Chest x-ray 927 2024 FINDINGS: Cardiovascular: Satisfactory opacification of the pulmonary arteries to the segmental level. Attenuated vasculature in the right upper lobe. No evidence of pulmonary embolism. Normal heart size. No pericardial effusion. Nonaneurysmal aorta. Moderate atherosclerosis. No dissection. Mediastinum/Nodes: Midline trachea. No thyroid mass. No suspicious lymph nodes. Esophagus within normal limits. Lungs/Pleura: Advanced emphysema with large right greater than left apical bulla and hyperexpansion of the right upper lobe. No acute airspace disease, pleural effusion or pneumothorax. Upper Abdomen: No acute finding Musculoskeletal: No acute or suspicious osseous abnormality Review of the MIP images confirms the above findings. IMPRESSION: 1. Negative for acute pulmonary embolus. 2. Advanced emphysema with large right greater than left apical bulla and hyperexpansion of the right upper lobe. No pneumothorax Aortic Atherosclerosis (ICD10-I70.0) and Emphysema (ICD10-J43.9). Electronically Signed   By: Jasmine Pang M.D.   On: 09/19/2023 20:19   DG Chest 2  View  Result Date: 09/19/2023 CLINICAL DATA:  Shortness of breath and congestion. EXAM: CHEST - 2 VIEW COMPARISON:  02/03/2019 FINDINGS: Lungs are markedly hyperexpanded. Lucency in the lung apices is progressive in the interval suggesting bullous changes superimposed on emphysema. Vertical linear density in the right upper lung raises the question of a pneumothorax given the background lucency. If this is a pneumothorax, orientation would suggest that a portion of the right upper lobe is scarred to the chest wall. Alternatively, this could be the wall of the large bulla. Overlying sheet or clothing could also generate this appearance. Dense airspace consolidation is seen at the right base. No substantial pleural effusion. No acute bony abnormality. Telemetry leads overlie the chest. IMPRESSION: 1. Marked hyperexpansion with progressive lucency in the lung apices suggesting bullous changes superimposed on emphysema. 2. Vertical linear density in the right upper lung raises the question of a pneumothorax given the background lucency. If this is a pneumothorax, orientation of this pleural line would suggest that a portion of the right upper lobe is scarred to the chest wall. Alternatively, this could be the wall of the large bulla. Overlying sheet or clothing could also generate this appearance. CT chest without contrast would likely prove quite helpful to further evaluate. 3. Dense airspace consolidation at the right base compatible with pneumonia. Electronically Signed   By: Kennith Center M.D.   On: 09/19/2023 18:31    Procedures Procedures   CRITICAL CARE Performed by: Arby Barrette   Total critical care time: 30 minutes  Critical care time was exclusive of separately billable procedures and treating other patients.  Critical care was necessary to treat or prevent imminent or life-threatening deterioration.  Critical care was time spent personally by me on the following activities: development of  treatment plan with patient and/or surrogate as well as nursing, discussions with consultants, evaluation of patient's response to treatment, examination of patient, obtaining history from patient or surrogate, ordering and performing treatments and interventions, ordering and review of laboratory studies, ordering and review of radiographic studies, pulse oximetry and re-evaluation of patient's condition.  Medications Ordered in ED Medications  methylPREDNISolone sodium succinate (SOLU-MEDROL) 125 mg/2 mL injection 125 mg (125 mg Intravenous Given 09/19/23 1805)  ipratropium-albuterol (DUONEB) 0.5-2.5 (3) MG/3ML nebulizer solution 3 mL (3 mLs Nebulization Given 09/19/23 1805)  lactated ringers bolus 1,000 mL (1,000 mLs Intravenous New Bag/Given 09/19/23 1948)  iohexol (OMNIPAQUE) 350 MG/ML injection 75 mL (75 mLs Intravenous Contrast Given 09/19/23 1951)    ED Course/ Medical Decision Making/ A&P                                 Medical Decision Making Amount and/or Complexity of Data Reviewed Labs: ordered. Radiology: ordered.  Risk Prescription drug management. Decision regarding hospitalization.  Patient is referred from urgent care for further diagnostic evaluation with hypoxia and COPD.  Patient is alert with clear mental status.  She does have increased work of breathing but does not show signs of imminent respiratory failure.  Breath sounds are very soft bilaterally.  Patient describes bilateral lower extremity swelling greater last weeks of this week.  Will proceed with broad diagnostic workup for COPD exacerbation\pneumonia\CHF\pneumothorax.  Patient was on 3 L nasal cannula oxygen when I came in the room.  Oxygen saturations at 100%.  I did turn oxygen off to observe if patient had drop in oxygen at rest.  Patient did not show desaturation lower than 95% at rest while I was in the room.  2 view chest x-ray reviewed by myself significant lucency of both lung fields with hyperexpansion.   No obvious pneumothorax.  Radiology reviewed the x-ray and suggest patient may have pneumothorax with tethering versus extensive bullae.  Will proceed with CT scan based on this interpretation.  1940 patient was taken to the bathroom ambulatory and got very short of breath.  Oxygen saturations dropped into the 80s.  Patient was brought back to her bed and wheelchair and blood pressures were soft in the low 90s over 60s.  I have reassessed the patient.  She denies she is having any chest pain.  She reports that she continues to have that perception that she has a lot of drainage from her nasal passages into the throat which get down to the upper throat and then make her feel "choked" such that she coughs and then feels short of breath.  Clinically she remains unchanged.  She is alert in no acute distress.  On oxygen with moderate increased work of breathing but speaking in full sentences.  Patient stable to go to CT scan.  I personally reviewed CT scan.  Patient has very large right sided bullae but does not have pneumothorax.  I do not appreciate any focal consolidations to suggest active pneumonia.  Do not appreciate any large or central PE.  I have reviewed radiology interpretation and no significant additional findings.  Patient updated on findings.  Patient treated with DuoNeb therapy and Solu-Medrol.  Patient will require admission for hypoxia with COPD.  Consult: [facility] hospitalist Dr. Maryjean Ka will admit.        Final Clinical Impression(s) / ED  Diagnoses Final diagnoses:  COPD exacerbation (HCC)  Giant bullous emphysema (HCC)    Rx / DC Orders ED Discharge Orders     None         Arby Barrette, MD 09/19/23 2054

## 2023-09-19 NOTE — ED Notes (Signed)
.ED TO INPATIENT HANDOFF REPORT  Name/Age/Gender Tracy Gray 57 y.o. female  Code Status    Code Status Orders  (From admission, onward)           Start     Ordered   09/19/23 2119  Full code  Continuous       Question:  By:  Answer:  Consent: discussion documented in EHR   09/19/23 2119           Code Status History     This patient has a current code status but no historical code status.       Home/SNF/Other Home  Chief Complaint COPD exacerbation (HCC) [J44.1]  Level of Care/Admitting Diagnosis ED Disposition     ED Disposition  Admit   Condition  --   Comment  Hospital Area: [facility] [[id number]]  Level of Care: Med-Surg [16]  May admit patient to [facility] or [facility] if equivalent level of care is available:: No  Covid Evaluation: Symptomatic Person Under Investigation (PUI) or recent exposure (last 10 days) *Testing Required*  Diagnosis: COPD exacerbation [facility]) [[id number]]  Admitting Physician: Nolberto Hanlon [[id number]]  Attending Physician: Nolberto Hanlon [[id number]]  Certification:: I certify this patient will need inpatient services for at least 2 midnights  Expected Medical Readiness: 09/22/2023          Medical History Past Medical History:  Diagnosis Date   Adenomatous colon polyp 03/26/2018   3 adenomas, max 10mm   Allergy    Anxiety    Asthma    Chronic headache    COPD (chronic obstructive pulmonary disease) (HCC)    Degenerative disc disease    Fibromyalgia    GERD (gastroesophageal reflux disease)    Hyperlipidemia    Hypertension    Multiple sclerosis (HCC)    Nerve damage    OSA (obstructive sleep apnea)    not on CPAP    Sleep apnea     Allergies Allergies  Allergen Reactions   Bee Venom Anaphylaxis   Penicillins Anaphylaxis   Dog Epithelium     Other Reaction(s): Other (See Comments)  Reaction to Asthma throat closing   Protonix [Pantoprazole Sodium] Diarrhea    IV  Location/Drains/Wounds Patient Lines/Drains/Airways Status     Active Line/Drains/Airways     Name Placement date Placement time Site Days   Peripheral IV 09/19/23 20 G Right Antecubital 09/19/23  1752  Antecubital  less than 1   Airway 6.5 mm 06/14/22  0732  -- 462   Incision (Closed) 07/11/21 Nose Left 07/11/21  0947  -- 800   Incision (Closed) 07/11/21 Nose Right 07/11/21  0947  -- 800   Incision (Closed) 06/14/22 Abdomen Other (Comment) 06/14/22  0924  -- 462   Incision (Closed) 06/14/22 Face Other (Comment) 06/14/22  0924  -- 462            Labs/Imaging Results for orders placed or performed during the hospital encounter of 09/19/23 (from the past 48 hour(s))  Comprehensive metabolic panel     Status: Abnormal   Collection Time: 09/19/23  5:52 PM  Result Value Ref Range   Sodium 141 135 - 145 mmol/L   Potassium 3.2 (L) 3.5 - 5.1 mmol/L   Chloride 106 98 - 111 mmol/L   CO2 24 22 - 32 mmol/L   Glucose, Bld 146 (H) 70 - 99 mg/dL    Comment: Glucose reference range applies only to samples taken after fasting for at least 8 hours.  BUN 7 6 - 20 mg/dL   Creatinine, Ser 5.40 0.44 - 1.00 mg/dL   Calcium 8.9 8.9 - 98.1 mg/dL   Total Protein 7.4 6.5 - 8.1 g/dL   Albumin 3.6 3.5 - 5.0 g/dL   AST 21 15 - 41 U/L   ALT 18 0 - 44 U/L   Alkaline Phosphatase 118 38 - 126 U/L   Total Bilirubin 0.5 0.3 - 1.2 mg/dL   GFR, Estimated >19 >14 mL/min    Comment: (NOTE) Calculated using the CKD-EPI Creatinine Equation (2021)    Anion gap 11 5 - 15    Comment: Performed at [facility], [address], [place], [place] [address]  CBC with Differential     Status: Abnormal   Collection Time: 09/19/23  5:52 PM  Result Value Ref Range   WBC 9.6 4.0 - 10.5 K/uL   RBC 3.85 (L) 3.87 - 5.11 MIL/uL   Hemoglobin 12.2 12.0 - 15.0 g/dL   HCT 62.1 30.8 - 65.7 %   MCV 93.5 80.0 - 100.0 fL   MCH 31.7 26.0 - 34.0 pg   MCHC 33.9 30.0 - 36.0 g/dL   RDW 84.6 96.2 - 95.2 %    Platelets 262 150 - 400 K/uL   nRBC 0.0 0.0 - 0.2 %   Neutrophils Relative % 83 %   Neutro Abs 8.0 (H) 1.7 - 7.7 K/uL   Lymphocytes Relative 6 %   Lymphs Abs 0.5 (L) 0.7 - 4.0 K/uL   Monocytes Relative 11 %   Monocytes Absolute 1.0 0.1 - 1.0 K/uL   Eosinophils Relative 0 %   Eosinophils Absolute 0.0 0.0 - 0.5 K/uL   Basophils Relative 0 %   Basophils Absolute 0.0 0.0 - 0.1 K/uL   Immature Granulocytes 0 %   Abs Immature Granulocytes 0.03 0.00 - 0.07 K/uL    Comment: Performed at [facility], [address], [place], [place] [address]  Urinalysis, w/ Reflex to Culture (Infection Suspected) -Urine, Clean Catch     Status: Abnormal   Collection Time: 09/19/23  5:52 PM  Result Value Ref Range   Specimen Source URINE, CLEAN CATCH    Color, Urine YELLOW YELLOW   APPearance CLEAR CLEAR   Specific Gravity, Urine 1.019 1.005 - 1.030   pH 5.0 5.0 - 8.0   Glucose, UA NEGATIVE NEGATIVE mg/dL   Hgb urine dipstick SMALL (A) NEGATIVE   Bilirubin Urine NEGATIVE NEGATIVE   Ketones, ur NEGATIVE NEGATIVE mg/dL   Protein, ur 30 (A) NEGATIVE mg/dL   Nitrite NEGATIVE NEGATIVE   Leukocytes,Ua NEGATIVE NEGATIVE   RBC / HPF 0-5 0 - 5 RBC/hpf   WBC, UA 0-5 0 - 5 WBC/hpf    Comment:        Reflex urine culture not performed if WBC <=10, OR if Squamous epithelial cells >5. If Squamous epithelial cells >5 suggest recollection.    Bacteria, UA NONE SEEN NONE SEEN   Squamous Epithelial / HPF 0-5 0 - 5 /HPF   Mucus PRESENT    Hyaline Casts, UA PRESENT     Comment: Performed at [facility], [address] [place], [place] [address]  Troponin I (High Sensitivity)     Status: None   Collection Time: 09/19/23  5:52 PM  Result Value Ref Range   Troponin I (High Sensitivity) 5 <18 ng/L    Comment: (NOTE) Elevated high sensitivity troponin I (hsTnI) values and significant  changes across serial measurements may suggest  ACS but many other  chronic and acute  conditions are known to elevate hsTnI results.  Refer to the "Links" section for chest pain algorithms and additional  guidance. Performed at [facility], [address] [place], [place] [address]   Brain natriuretic peptide     Status: Abnormal   Collection Time: 09/19/23  5:52 PM  Result Value Ref Range   B Natriuretic Peptide 151.2 (H) 0.0 - 100.0 pg/mL    Comment: Performed at [facility], [address] [place], [place] [address]  I-Stat Lactic Acid, ED     Status: None   Collection Time: 09/19/23  6:05 PM  Result Value Ref Range   Lactic Acid, Venous 1.5 0.5 - 1.9 mmol/L  Troponin I (High Sensitivity)     Status: None   Collection Time: 09/19/23  7:16 PM  Result Value Ref Range   Troponin I (High Sensitivity) 5 <18 ng/L    Comment: (NOTE) Elevated high sensitivity troponin I (hsTnI) values and significant  changes across serial measurements may suggest ACS but many other  chronic and acute conditions are known to elevate hsTnI results.  Refer to the "Links" section for chest pain algorithms and additional  guidance. Performed at [facility], [address], [place], [place] [address]   I-Stat Lactic Acid, ED     Status: None   Collection Time: 09/19/23  7:29 PM  Result Value Ref Range   Lactic Acid, Venous 1.5 0.5 - 1.9 mmol/L   CT Angio Chest PE W/Cm &/Or Wo Cm  Result Date: 09/19/2023 CLINICAL DATA:  Shortness of breath EXAM: CT ANGIOGRAPHY CHEST WITH CONTRAST TECHNIQUE: Multidetector CT imaging of the chest was performed using the standard protocol during bolus administration of intravenous contrast. Multiplanar CT image reconstructions and MIPs were obtained to evaluate the vascular anatomy. RADIATION DOSE REDUCTION: This exam was performed according to the departmental dose-optimization program which includes automated exposure control, adjustment of the mA and/or kV according to patient size and/or use of  iterative reconstruction technique. CONTRAST:  75mL OMNIPAQUE IOHEXOL 350 MG/ML SOLN COMPARISON:  Chest x-ray 927 2024 FINDINGS: Cardiovascular: Satisfactory opacification of the pulmonary arteries to the segmental level. Attenuated vasculature in the right upper lobe. No evidence of pulmonary embolism. Normal heart size. No pericardial effusion. Nonaneurysmal aorta. Moderate atherosclerosis. No dissection. Mediastinum/Nodes: Midline trachea. No thyroid mass. No suspicious lymph nodes. Esophagus within normal limits. Lungs/Pleura: Advanced emphysema with large right greater than left apical bulla and hyperexpansion of the right upper lobe. No acute airspace disease, pleural effusion or pneumothorax. Upper Abdomen: No acute finding Musculoskeletal: No acute or suspicious osseous abnormality Review of the MIP images confirms the above findings. IMPRESSION: 1. Negative for acute pulmonary embolus. 2. Advanced emphysema with large right greater than left apical bulla and hyperexpansion of the right upper lobe. No pneumothorax Aortic Atherosclerosis (ICD10-I70.0) and Emphysema (ICD10-J43.9). Electronically Signed   By: Jasmine Pang M.D.   On: 09/19/2023 20:19   DG Chest 2 View  Result Date: 09/19/2023 CLINICAL DATA:  Shortness of breath and congestion. EXAM: CHEST - 2 VIEW COMPARISON:  02/03/2019 FINDINGS: Lungs are markedly hyperexpanded. Lucency in the lung apices is progressive in the interval suggesting bullous changes superimposed on emphysema. Vertical linear density in the right upper lung raises the question of a pneumothorax given the background lucency. If this is a pneumothorax, orientation would suggest that a portion of the right upper lobe is scarred to the chest wall. Alternatively, this  could be the wall of the large bulla. Overlying sheet or clothing could also generate this appearance. Dense airspace consolidation is seen at the right base. No substantial pleural effusion. No acute bony  abnormality. Telemetry leads overlie the chest. IMPRESSION: 1. Marked hyperexpansion with progressive lucency in the lung apices suggesting bullous changes superimposed on emphysema. 2. Vertical linear density in the right upper lung raises the question of a pneumothorax given the background lucency. If this is a pneumothorax, orientation of this pleural line would suggest that a portion of the right upper lobe is scarred to the chest wall. Alternatively, this could be the wall of the large bulla. Overlying sheet or clothing could also generate this appearance. CT chest without contrast would likely prove quite helpful to further evaluate. 3. Dense airspace consolidation at the right base compatible with pneumonia. Electronically Signed   By: Kennith Center M.D.   On: 09/19/2023 18:31    Pending Labs Unresulted Labs (From admission, onward)     Start     Ordered   09/26/23 0500  Creatinine, serum  (enoxaparin (LOVENOX)    CrCl >/= 30 ml/min)  Weekly,   R     Comments: while on enoxaparin therapy    09/19/23 2119   09/20/23 0500  APTT  Tomorrow morning,   R        09/19/23 2119   09/20/23 0500  Protime-INR  Tomorrow morning,   R        09/19/23 2119   09/20/23 0500  Basic metabolic panel  Tomorrow morning,   R        09/19/23 2119   09/20/23 0500  CBC  Tomorrow morning,   R        09/19/23 2119   09/20/23 0500  Cortisol-am, blood  Tomorrow morning,   R        09/19/23 2130   09/20/23 0500  TSH  Tomorrow morning,   R        09/19/23 2130   09/19/23 2153  Culture, blood (Routine X 2) w Reflex to ID Panel  BLOOD CULTURE X 2,   R (with TIMED occurrences)      09/19/23 2152   09/19/23 2128  Expectorated Sputum Assessment w Gram Stain, Rflx to Resp Cult  ONCE - URGENT,   URGENT        09/19/23 2127   09/19/23 2119  CBC  (enoxaparin (LOVENOX)    CrCl >/= 30 ml/min)  Once,   R       Comments: Baseline for enoxaparin therapy IF NOT ALREADY DRAWN.  Notify MD if PLT < 100 K.    09/19/23 2119    09/19/23 2119  Creatinine, serum  (enoxaparin (LOVENOX)    CrCl >/= 30 ml/min)  Once,   R       Comments: Baseline for enoxaparin therapy IF NOT ALREADY DRAWN.    09/19/23 2119   09/19/23 2119  HIV Antibody (routine testing w rflx)  (HIV Antibody (Routine testing w reflex) panel)  Once,   R        09/19/23 2119   09/19/23 2106  Respiratory (~20 pathogens) panel by PCR  (Respiratory panel by PCR (~20 pathogens, ~24 hr TAT)  w precautions)  Once,   URGENT        09/19/23 2105   09/19/23 2106  SARS Coronavirus 2 by RT PCR (hospital order, performed in [facility] hospital lab) *cepheid single result test* Nasopharyngeal Swab  (Tier 2 - SARS  Coronavirus 2 by RT PCR (hospital order, performed in [facility] hospital lab) *cepheid single result test*)  Once,   URGENT        09/19/23 2105            Vitals/Pain Today's Vitals   09/19/23 1915 09/19/23 1930 09/19/23 1940 09/19/23 1945  BP: 94/74 (!) 87/69 91/69 (!) 94/54  Pulse: (!) 108 (!) 103 (!) 106 (!) 104  Resp: 19 (!) 21 18 18   Temp:      TempSrc:      SpO2: 98% 98% 98% 98%  Weight:      Height:      PainSc:        Isolation Precautions Airborne and Contact precautions  Medications Medications  ipratropium-albuterol (DUONEB) 0.5-2.5 (3) MG/3ML nebulizer solution 3 mL (has no administration in time range)  predniSONE (DELTASONE) tablet 50 mg (has no administration in time range)  pantoprazole (PROTONIX) EC tablet 20 mg (has no administration in time range)  enoxaparin (LOVENOX) injection 40 mg (has no administration in time range)  acetaminophen (TYLENOL) tablet 650 mg (has no administration in time range)    Or  acetaminophen (TYLENOL) suppository 650 mg (has no administration in time range)  polyethylene glycol (MIRALAX / GLYCOLAX) packet 17 g (has no administration in time range)  sodium chloride flush (NS) 0.9 % injection 3 mL (has no administration in time range)  levofloxacin (LEVAQUIN) IVPB 500 mg (has no  administration in time range)  lactated ringers bolus 1,000 mL (has no administration in time range)  potassium chloride (KLOR-CON) packet 60 mEq (has no administration in time range)  fluticasone furoate-vilanterol (BREO ELLIPTA) 100-25 MCG/ACT 1 puff (has no administration in time range)  methylPREDNISolone sodium succinate (SOLU-MEDROL) 125 mg/2 mL injection 125 mg (125 mg Intravenous Given 09/19/23 1805)  ipratropium-albuterol (DUONEB) 0.5-2.5 (3) MG/3ML nebulizer solution 3 mL (3 mLs Nebulization Given 09/19/23 1805)  lactated ringers bolus 1,000 mL (1,000 mLs Intravenous New Bag/Given 09/19/23 1948)  iohexol (OMNIPAQUE) 350 MG/ML injection 75 mL (75 mLs Intravenous Contrast Given 09/19/23 1951)    Mobility walks

## 2023-09-19 NOTE — Assessment & Plan Note (Addendum)
I would call it more of a soft BP. This is noted in incidentally.  I am not sure if patient is symptomatic from this.  It is possible that it is contributing though to the patient's current symptoms of marked fatigue and sensation of shortness of breath.  Troponin is negative, I will pursue an echo, get a cortisol level in the morning.  And also TSH.  I will certainly hold patient's antihypertensive regimen.  Which patient has been holding for the last few days.  I will give her an empiric fluid bolus (patient arelady received a liter in ER). Check lactic aicd. Check orthostatic vitals.

## 2023-09-19 NOTE — ED Triage Notes (Signed)
"  I am so out of breath, I accidentally broke my nebulizer". Started with Sinus Congestion/Pressure then a cough almost 2 wks ago now. "I went to my provider Monday but did not receive any treatment". No fever. "I do have COPD but not usually the trouble breathing like today".

## 2023-09-20 ENCOUNTER — Inpatient Hospital Stay (HOSPITAL_COMMUNITY): Payer: 59

## 2023-09-20 DIAGNOSIS — J439 Emphysema, unspecified: Secondary | ICD-10-CM | POA: Diagnosis not present

## 2023-09-20 DIAGNOSIS — E861 Hypovolemia: Secondary | ICD-10-CM | POA: Diagnosis not present

## 2023-09-20 DIAGNOSIS — D649 Anemia, unspecified: Secondary | ICD-10-CM | POA: Insufficient documentation

## 2023-09-20 DIAGNOSIS — R0602 Shortness of breath: Secondary | ICD-10-CM

## 2023-09-20 DIAGNOSIS — J9601 Acute respiratory failure with hypoxia: Secondary | ICD-10-CM | POA: Insufficient documentation

## 2023-09-20 DIAGNOSIS — J441 Chronic obstructive pulmonary disease with (acute) exacerbation: Secondary | ICD-10-CM | POA: Diagnosis not present

## 2023-09-20 LAB — HEPATITIS B SURFACE ANTIGEN: Hepatitis B Surface Ag: NONREACTIVE

## 2023-09-20 LAB — CBC
HCT: 35.2 % — ABNORMAL LOW (ref 36.0–46.0)
Hemoglobin: 11.4 g/dL — ABNORMAL LOW (ref 12.0–15.0)
MCH: 31.3 pg (ref 26.0–34.0)
MCHC: 32.4 g/dL (ref 30.0–36.0)
MCV: 96.7 fL (ref 80.0–100.0)
Platelets: 247 10*3/uL (ref 150–400)
RBC: 3.64 MIL/uL — ABNORMAL LOW (ref 3.87–5.11)
RDW: 14.1 % (ref 11.5–15.5)
WBC: 6.4 10*3/uL (ref 4.0–10.5)
nRBC: 0 % (ref 0.0–0.2)

## 2023-09-20 LAB — BASIC METABOLIC PANEL
Anion gap: 10 (ref 5–15)
BUN: 8 mg/dL (ref 6–20)
CO2: 25 mmol/L (ref 22–32)
Calcium: 8.6 mg/dL — ABNORMAL LOW (ref 8.9–10.3)
Chloride: 105 mmol/L (ref 98–111)
Creatinine, Ser: 0.81 mg/dL (ref 0.44–1.00)
GFR, Estimated: >60 mL/min (ref >60)
Glucose, Bld: 160 mg/dL — ABNORMAL HIGH (ref 70–99)
Potassium: 4.1 mmol/L (ref 3.5–5.1)
Sodium: 140 mmol/L (ref 135–145)

## 2023-09-20 LAB — RESPIRATORY PANEL BY PCR

## 2023-09-20 LAB — ECHOCARDIOGRAM COMPLETE
AV Peak grad: 9.1 mm[Hg]
Ao pk vel: 1.51 m/s
Area-P 1/2: 5.13 cm2
Height: 64 in
Weight: 2479.73 [oz_av]

## 2023-09-20 LAB — PROTIME-INR
INR: 1.1 (ref 0.8–1.2)
Prothrombin Time: 14.8 s (ref 11.4–15.2)

## 2023-09-20 LAB — HEPATITIS A ANTIBODY, TOTAL: hep A Total Ab: NONREACTIVE

## 2023-09-20 LAB — CORTISOL-AM, BLOOD: Cortisol - AM: 4.1 ug/dL — ABNORMAL LOW (ref 6.7–22.6)

## 2023-09-20 LAB — TSH: TSH: 0.484 u[IU]/mL (ref 0.350–4.500)

## 2023-09-20 LAB — APTT: aPTT: 39 s — ABNORMAL HIGH (ref 24–36)

## 2023-09-20 LAB — HIV ANTIBODY (ROUTINE TESTING W REFLEX): HIV Screen 4th Generation wRfx: REACTIVE — AB

## 2023-09-20 MED ORDER — GUAIFENESIN-CODEINE 100-10 MG/5ML PO SOLN
10.0000 mL | Freq: Four times a day (QID) | ORAL | Status: DC | PRN
  Administered 2023-09-20 – 2023-09-25 (×12): 10 mL via ORAL
  Filled 2023-09-20 (×12): qty 10

## 2023-09-20 MED ORDER — ROSUVASTATIN CALCIUM 10 MG PO TABS
20.0000 mg | ORAL_TABLET | Freq: Every day | ORAL | Status: DC
  Administered 2023-09-20 – 2023-09-25 (×6): 20 mg via ORAL
  Filled 2023-09-20 (×6): qty 2

## 2023-09-20 MED ORDER — ARFORMOTEROL TARTRATE 15 MCG/2ML IN NEBU
15.0000 ug | INHALATION_SOLUTION | Freq: Two times a day (BID) | RESPIRATORY_TRACT | Status: DC
Start: 2023-09-20 — End: 2023-09-20

## 2023-09-20 MED ORDER — INFLUENZA VIRUS VACC SPLIT PF (FLUZONE) 0.5 ML IM SUSY: 0.5000 mL | PREFILLED_SYRINGE | INTRAMUSCULAR | Status: DC | PRN

## 2023-09-20 MED ORDER — UMECLIDINIUM BROMIDE 62.5 MCG/ACT IN AEPB
1.0000 | INHALATION_SPRAY | Freq: Every day | RESPIRATORY_TRACT | Status: DC
Start: 2023-09-20 — End: 2023-09-20

## 2023-09-20 MED ORDER — PREDNISONE 50 MG PO TABS
50.0000 mg | ORAL_TABLET | Freq: Every day | ORAL | Status: DC
  Administered 2023-09-22 – 2023-09-25 (×4): 50 mg via ORAL
  Filled 2023-09-20 (×4): qty 1

## 2023-09-20 MED ORDER — SODIUM CHLORIDE 0.9 % IV SOLN: INTRAVENOUS | Status: AC

## 2023-09-20 MED ORDER — ALBUTEROL SULFATE (2.5 MG/3ML) 0.083% IN NEBU
2.5000 mg | INHALATION_SOLUTION | Freq: Four times a day (QID) | RESPIRATORY_TRACT | Status: DC
  Administered 2023-09-20 – 2023-09-24 (×15): 2.5 mg via RESPIRATORY_TRACT
  Filled 2023-09-20 (×15): qty 3

## 2023-09-20 MED ORDER — METHYLPREDNISOLONE SODIUM SUCC 40 MG IJ SOLR
40.0000 mg | Freq: Two times a day (BID) | INTRAMUSCULAR | Status: DC
  Administered 2023-09-20 – 2023-09-21 (×3): 40 mg via INTRAVENOUS
  Filled 2023-09-20 (×3): qty 1

## 2023-09-20 MED ORDER — ALBUTEROL SULFATE (2.5 MG/3ML) 0.083% IN NEBU
2.5000 mg | INHALATION_SOLUTION | RESPIRATORY_TRACT | Status: DC
  Administered 2023-09-20 (×2): 2.5 mg via RESPIRATORY_TRACT
  Filled 2023-09-20 (×2): qty 3

## 2023-09-20 MED ORDER — SODIUM CHLORIDE 0.9 % IV SOLN
100.0000 mg | Freq: Two times a day (BID) | INTRAVENOUS | Status: DC
  Administered 2023-09-20 – 2023-09-22 (×5): 100 mg via INTRAVENOUS
  Filled 2023-09-20 (×6): qty 100

## 2023-09-20 MED ORDER — CLONAZEPAM 1 MG PO TABS
2.0000 mg | ORAL_TABLET | Freq: Every day | ORAL | Status: DC
  Administered 2023-09-20 – 2023-09-24 (×5): 2 mg via ORAL
  Filled 2023-09-20: qty 2
  Filled 2023-09-20 (×4): qty 4

## 2023-09-20 MED ORDER — ALBUTEROL SULFATE (2.5 MG/3ML) 0.083% IN NEBU
2.5000 mg | INHALATION_SOLUTION | RESPIRATORY_TRACT | Status: DC | PRN
  Administered 2023-09-20 – 2023-09-24 (×4): 2.5 mg via RESPIRATORY_TRACT
  Filled 2023-09-20 (×4): qty 3

## 2023-09-20 NOTE — Plan of Care (Signed)

## 2023-09-20 NOTE — Progress Notes (Signed)
  Echocardiogram 2D Echocardiogram has been performed.  Lucendia Herrlich 09/20/2023, 2:06 PM

## 2023-09-20 NOTE — Plan of Care (Signed)

## 2023-09-20 NOTE — Progress Notes (Signed)
[facility] PROGRESS NOTE    Progress Note  Tracy Gray  XLK:[id number] DOB: [birth year] DOA: 09/19/2023 PCP: Burton Apley, MD     Brief Narrative:   Tracy Gray is an 57 y.o. female past medical history of COPD, 40-year pack history quit about 4 years ago, relate that about 2 weeks ago started having nasal congestion and sinus drainage followed by productive cough and shortness of breath initially exertional but now at rest came into the ED found to be hypoxic placed on 3 L of oxygen.  CT angio of the chest negative for PE advance emphysema with right greater than left apical bullae   Assessment/Plan:   Acute respiratory failure with hypoxia secondarily to COPD  GOLD III with min reversiblity  She was started on steroids and Levaquin de-escalated to IV Doxy. Now on 2 L satting greater 98%. Out of bed to chair, incentive spirometry, try to wean to room air. She has remained febrile no leukocytosis. Respiratory virus is positive for rhinovirus.  Hypotension/essential hypertension: Amlodipine, clonidine lisinopril and hydrochlorothiazide were held on admission. Started on IV fluids and tachycardia resolved.  Blood pressure has remained stable. Continue to hold antihypertensive medication. 2D echo is pending. Troponins flat, lactic acid unremarkable, TSH 0.4  DVT prophylaxis: lovenox Family Communication:none Status is: Inpatient Remains inpatient appropriate because: Acute respiratory failure with hypoxia due to COPD exacerbation    Code Status:     Code Status Orders  (From admission, onward)           Start     Ordered   09/19/23 2119  Full code  Continuous       Question:  By:  Answer:  Consent: discussion documented in EHR   09/19/23 2119           Code Status History     This patient has a current code status but no historical code status.         IV Access:   Peripheral IV   Procedures and diagnostic studies:   CT Angio  Chest PE W/Cm &/Or Wo Cm  Result Date: 09/19/2023 CLINICAL DATA:  Shortness of breath EXAM: CT ANGIOGRAPHY CHEST WITH CONTRAST TECHNIQUE: Multidetector CT imaging of the chest was performed using the standard protocol during bolus administration of intravenous contrast. Multiplanar CT image reconstructions and MIPs were obtained to evaluate the vascular anatomy. RADIATION DOSE REDUCTION: This exam was performed according to the departmental dose-optimization program which includes automated exposure control, adjustment of the mA and/or kV according to patient size and/or use of iterative reconstruction technique. CONTRAST:  75mL OMNIPAQUE IOHEXOL 350 MG/ML SOLN COMPARISON:  Chest x-ray 927 2024 FINDINGS: Cardiovascular: Satisfactory opacification of the pulmonary arteries to the segmental level. Attenuated vasculature in the right upper lobe. No evidence of pulmonary embolism. Normal heart size. No pericardial effusion. Nonaneurysmal aorta. Moderate atherosclerosis. No dissection. Mediastinum/Nodes: Midline trachea. No thyroid mass. No suspicious lymph nodes. Esophagus within normal limits. Lungs/Pleura: Advanced emphysema with large right greater than left apical bulla and hyperexpansion of the right upper lobe. No acute airspace disease, pleural effusion or pneumothorax. Upper Abdomen: No acute finding Musculoskeletal: No acute or suspicious osseous abnormality Review of the MIP images confirms the above findings. IMPRESSION: 1. Negative for acute pulmonary embolus. 2. Advanced emphysema with large right greater than left apical bulla and hyperexpansion of the right upper lobe. No pneumothorax Aortic Atherosclerosis (ICD10-I70.0) and Emphysema (ICD10-J43.9). Electronically Signed   By: Jasmine Pang M.D.   On: 09/19/2023 20:19  DG Chest 2 View  Result Date: 09/19/2023 CLINICAL DATA:  Shortness of breath and congestion. EXAM: CHEST - 2 VIEW COMPARISON:  02/03/2019 FINDINGS: Lungs are markedly hyperexpanded.  Lucency in the lung apices is progressive in the interval suggesting bullous changes superimposed on emphysema. Vertical linear density in the right upper lung raises the question of a pneumothorax given the background lucency. If this is a pneumothorax, orientation would suggest that a portion of the right upper lobe is scarred to the chest wall. Alternatively, this could be the wall of the large bulla. Overlying sheet or clothing could also generate this appearance. Dense airspace consolidation is seen at the right base. No substantial pleural effusion. No acute bony abnormality. Telemetry leads overlie the chest. IMPRESSION: 1. Marked hyperexpansion with progressive lucency in the lung apices suggesting bullous changes superimposed on emphysema. 2. Vertical linear density in the right upper lung raises the question of a pneumothorax given the background lucency. If this is a pneumothorax, orientation of this pleural line would suggest that a portion of the right upper lobe is scarred to the chest wall. Alternatively, this could be the wall of the large bulla. Overlying sheet or clothing could also generate this appearance. CT chest without contrast would likely prove quite helpful to further evaluate. 3. Dense airspace consolidation at the right base compatible with pneumonia. Electronically Signed   By: Kennith Center M.D.   On: 09/19/2023 18:31     Medical Consultants:   None.   Subjective:    Tracy Gray relates she is not hungry breathing is not improved.  Objective:    Vitals:   09/19/23 2219 09/19/23 2252 09/19/23 2336 09/20/23 0315  BP: 102/72 98/64  (!) 93/58  Pulse: 93 92  92  Resp: 17 17  18   Temp:  97.6 F (36.4 C)  97.7 F (36.5 C)  TempSrc:  Oral  Oral  SpO2: 99% 97% 98% 100%  Weight:      Height:       SpO2: 100 % O2 Flow Rate (L/min): 2 L/min   Intake/Output Summary (Last 24 hours) at 09/20/2023 0658 Last data filed at 09/20/2023 0401 Gross per 24 hour  Intake  1098.99 ml  Output --  Net 1098.99 ml   Filed Weights   09/19/23 1654  Weight: 70.3 kg    Exam: General exam: In no acute distress. Respiratory system: Moderate air movement, wheezing bilaterally. Cardiovascular system: S1 & S2 heard, RRR. Gastrointestinal system: Abdomen is nondistended, soft and nontender.  Extremities: No pedal edema. Skin: No rashes, lesions or ulcers Psychiatry: Judgement and insight appear normal. Mood & affect appropriate.    Data Reviewed:    Labs: Basic Metabolic Panel: Recent Labs  Lab 09/19/23 1752 09/20/23 0446  NA 141 140  K 3.2* 4.1  CL 106 105  CO2 24 25  GLUCOSE 146* 160*  BUN 7 8  CREATININE 0.73 0.81  CALCIUM 8.9 8.6*   GFR Estimated Creatinine Clearance: 72.8 mL/min (by C-G formula based on SCr of 0.81 mg/dL). Liver Function Tests: Recent Labs  Lab 09/19/23 1752  AST 21  ALT 18  ALKPHOS 118  BILITOT 0.5  PROT 7.4  ALBUMIN 3.6   No results for input(s): "LIPASE", "AMYLASE" in the last 168 hours. No results for input(s): "AMMONIA" in the last 168 hours. Coagulation profile Recent Labs  Lab 09/20/23 0446  INR 1.1   COVID-19 Labs  No results for input(s): "DDIMER", "FERRITIN", "LDH", "CRP" in the last 72 hours.  Lab Results  Component Value Date   SARSCOV2NAA NEGATIVE 09/19/2023   SARSCOV2NAA NEGATIVE 08/25/2021   SARSCOV2NAA Not Detected 11/15/2019    CBC: Recent Labs  Lab 09/19/23 1752 09/20/23 0446  WBC 9.6 6.4  NEUTROABS 8.0*  --   HGB 12.2 11.4*  HCT 36.0 35.2*  MCV 93.5 96.7  PLT 262 247   Cardiac Enzymes: No results for input(s): "CKTOTAL", "CKMB", "CKMBINDEX", "TROPONINI" in the last 168 hours. BNP (last 3 results) No results for input(s): "PROBNP" in the last 8760 hours. CBG: No results for input(s): "GLUCAP" in the last 168 hours. D-Dimer: No results for input(s): "DDIMER" in the last 72 hours. Hgb A1c: No results for input(s): "HGBA1C" in the last 72 hours. Lipid Profile: No results  for input(s): "CHOL", "HDL", "LDLCALC", "TRIG", "CHOLHDL", "LDLDIRECT" in the last 72 hours. Thyroid function studies: Recent Labs    09/20/23 0446  TSH 0.484   Anemia work up: No results for input(s): "VITAMINB12", "FOLATE", "FERRITIN", "TIBC", "IRON", "RETICCTPCT" in the last 72 hours. Sepsis Labs: Recent Labs  Lab 09/19/23 1752 09/19/23 1805 09/19/23 1929 09/20/23 0446  WBC 9.6  --   --  6.4  LATICACIDVEN  --  1.5 1.5  --    Microbiology Recent Results (from the past 240 hour(s))  Respiratory (~20 pathogens) panel by PCR     Status: Abnormal   Collection Time: 09/19/23  9:19 PM   Specimen: Nasopharyngeal Swab; Respiratory  Result Value Ref Range Status   Adenovirus NOT DETECTED NOT DETECTED Final   Coronavirus 229E NOT DETECTED NOT DETECTED Final    Comment: (NOTE) The Coronavirus on the Respiratory Panel, DOES NOT test for the novel  Coronavirus (2019 nCoV)    Coronavirus HKU1 NOT DETECTED NOT DETECTED Final   Coronavirus NL63 NOT DETECTED NOT DETECTED Final   Coronavirus OC43 NOT DETECTED NOT DETECTED Final   Metapneumovirus NOT DETECTED NOT DETECTED Final   Rhinovirus / Enterovirus DETECTED (A) NOT DETECTED Final   Influenza A NOT DETECTED NOT DETECTED Final   Influenza B NOT DETECTED NOT DETECTED Final   Parainfluenza Virus 1 NOT DETECTED NOT DETECTED Final   Parainfluenza Virus 2 NOT DETECTED NOT DETECTED Final   Parainfluenza Virus 3 NOT DETECTED NOT DETECTED Final   Parainfluenza Virus 4 NOT DETECTED NOT DETECTED Final   Respiratory Syncytial Virus NOT DETECTED NOT DETECTED Final   Bordetella pertussis NOT DETECTED NOT DETECTED Final   Bordetella Parapertussis NOT DETECTED NOT DETECTED Final   Chlamydophila pneumoniae NOT DETECTED NOT DETECTED Final   Mycoplasma pneumoniae NOT DETECTED NOT DETECTED Final    Comment: Performed at [facility], [address], [place], [place] [address]  SARS Coronavirus 2 by RT PCR (hospital order, performed in [facility] hospital lab) *cepheid single result test* Nasopharyngeal Swab     Status: None   Collection Time: 09/19/23  9:19 PM   Specimen: Nasopharyngeal Swab; Nasal Swab  Result Value Ref Range Status   SARS Coronavirus 2 by RT PCR NEGATIVE NEGATIVE Final    Comment: (NOTE) SARS-CoV-2 target nucleic acids are NOT DETECTED.  The SARS-CoV-2 RNA is generally detectable in upper and lower respiratory specimens during the acute phase of infection. The lowest concentration of SARS-CoV-2 viral copies this assay can detect is 250 copies / mL. A negative result does not preclude SARS-CoV-2 infection and should not be used as the sole basis for treatment or other patient management decisions.  A negative result may occur with improper specimen collection /  handling, submission of specimen other than nasopharyngeal swab, presence of viral mutation(s) within the areas targeted by this assay, and inadequate number of viral copies (<250 copies / mL). A negative result must be combined with clinical observations, patient history, and epidemiological information.  Fact Sheet for Patients:   RoadLapTop.co.za  Fact Sheet for Healthcare Providers: http://kim-miller.com/  This test is not yet approved or  cleared by the [place] FDA and has been authorized for detection and/or diagnosis of SARS-CoV-2 by FDA under an Emergency Use Authorization (EUA).  This EUA will remain in effect (meaning this test can be used) for the duration of the COVID-19 declaration under Section 564(b)(1) of the Act, 21 U.S.C. section 360bbb-3(b)(1), unless the authorization is terminated or revoked sooner.  Performed at [facility], [address], [place], [place] [address]      Medications:    enoxaparin (LOVENOX) injection  40 mg Subcutaneous Q24H   fluticasone furoate-vilanterol  1 puff Inhalation Daily   influenza vac split trivalent PF  0.5 mL  Intramuscular Tomorrow-1000   pantoprazole  20 mg Oral Daily   pneumococcal 20-valent conjugate vaccine  0.5 mL Intramuscular Tomorrow-1000   predniSONE  50 mg Oral Q breakfast   sodium chloride flush  3 mL Intravenous Q12H   Continuous Infusions:  levofloxacin (LEVAQUIN) IV Stopped (09/20/23 0019)      LOS: 1 day   Marinda Elk  [facility]  09/20/2023, 6:58 AM

## 2023-09-21 DIAGNOSIS — J9601 Acute respiratory failure with hypoxia: Secondary | ICD-10-CM

## 2023-09-21 DIAGNOSIS — J441 Chronic obstructive pulmonary disease with (acute) exacerbation: Secondary | ICD-10-CM | POA: Diagnosis not present

## 2023-09-21 LAB — HIV-1 RNA QUANT-NO REFLEX-BLD
HIV 1 RNA Quant: <20 {copies}/mL
LOG10 HIV-1 RNA: UNDETERMINED {Log}

## 2023-09-21 MED ORDER — METHYLPREDNISOLONE SODIUM SUCC 125 MG IJ SOLR
80.0000 mg | Freq: Two times a day (BID) | INTRAMUSCULAR | Status: AC
  Administered 2023-09-21: 80 mg via INTRAVENOUS
  Filled 2023-09-21: qty 2

## 2023-09-21 NOTE — Progress Notes (Signed)
[facility] PROGRESS NOTE    Progress Note  DAESHA SUTHERBY  WJX:[id number] DOB: [birth year] DOA: 09/19/2023 PCP: Burton Apley, MD     Brief Narrative:   Tracy Gray is an 57 y.o. female past medical history of COPD, 40-year pack history quit about 4 years ago, relate that about 2 weeks ago started having nasal congestion and sinus drainage followed by productive cough and shortness of breath initially exertional but now at rest came into the ED found to be hypoxic placed on 3 L of oxygen.  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 Still requiring 2 L of oxygen to keep saturations greater 96%. She has remained febrile no leukocytosis. Respiratory virus is positive for rhinovirus.  Hypotension/essential hypertension: Continue to hold amlodipine, clonidine lisinopril and hydrochlorothiazide blood pressure continues to be borderline but stable.  2D echo showed an EF of 60% with grade 1 diastolic dysfunction. Troponins flat, lactic acid unremarkable, TSH 0.4.  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  Will discuss with patient  DVT prophylaxis: lovenox Family Communication:none Status is: Inpatient Remains inpatient appropriate because: Acute respiratory failure with hypoxia due to COPD exacerbation    Code Status:     Code Status Orders  (From admission, onward)           Start     Ordered   09/19/23 2119  Full code  Continuous       Question:  By:  Answer:  Consent: discussion documented in EHR   09/19/23 2119           Code Status History     This patient has a current code status but no historical code status.         IV Access:   Peripheral IV   Procedures and diagnostic studies:   ECHOCARDIOGRAM  COMPLETE  Result Date: 09/20/2023    ECHOCARDIOGRAM REPORT   Patient Name:   TAMY DANJOU Date of Exam: 09/20/2023 Medical Rec #:  [id number]         Height:       64.0 in Accession #:    [id number]        Weight:       155.0 lb Date of Birth:  1966-11-06         BSA:          1.755 m Patient Age:    57 years          BP:           108/72 mmHg Patient Gender: F                 HR:           92 bpm. Exam Location:  Inpatient Procedure: 2D Echo, Cardiac Doppler, Color Doppler and Saline Contrast Bubble            Study Indications:    Stroke I63.9  History:        Patient has no prior history of Echocardiogram examinations.                 COPD, Signs/Symptoms:Hypotension and Dyspnea; Risk                 Factors:Former Smoker, Sleep Apnea and Dyslipidemia.  Sonographer:  Lucendia Herrlich Referring Phys: [id number] [facility] GOEL  Sonographer Comments: Image acquisition challenging due to respiratory motion. IMPRESSIONS  1. Left ventricular ejection fraction, by estimation, is 60 to 65%. The left ventricle has normal function. The left ventricle has no regional wall motion abnormalities. Left ventricular diastolic parameters are consistent with Grade I diastolic dysfunction (impaired relaxation).  2. Right ventricular systolic function is normal. The right ventricular size is normal. Tricuspid regurgitation signal is inadequate for assessing PA pressure.  3. The mitral valve is normal in structure. No evidence of mitral valve regurgitation.  4. Aortic valve regurgitation is not visualized.  5. Pulmonic valve regurgitation not evaluated.  6. Aortic not evaluated.  7. The inferior vena cava is dilated in size with >50% respiratory variability, suggesting right atrial pressure of 8 mmHg.  8. Agitated saline contrast bubble study was negative, with no evidence of any interatrial shunt. Conclusion(s)/Recommendation(s): No intracardiac source of embolism detected on this transthoracic study. Consider a transesophageal  echocardiogram to exclude cardiac source of embolism if clinically indicated. FINDINGS  Left Ventricle: Left ventricular ejection fraction, by estimation, is 60 to 65%. The left ventricle has normal function. The left ventricle has no regional wall motion abnormalities. The left ventricular internal cavity size was normal in size. There is  no left ventricular hypertrophy. Left ventricular diastolic parameters are consistent with Grade I diastolic dysfunction (impaired relaxation). Right Ventricle: The right ventricular size is normal. Right ventricular systolic function is normal. Tricuspid regurgitation signal is inadequate for assessing PA pressure. Left Atrium: Left atrial size was normal in size. Right Atrium: Right atrial size was normal in size. Pericardium: There is no evidence of pericardial effusion. Mitral Valve: The mitral valve is normal in structure. No evidence of mitral valve regurgitation. Tricuspid Valve: Tricuspid valve regurgitation is not demonstrated. Aortic Valve: Aortic valve regurgitation is not visualized. Aortic valve peak gradient measures 9.1 mmHg. Pulmonic Valve: The pulmonic valve was not well visualized. Pulmonic valve regurgitation not evaluated. Aorta: Not evaluated. Venous: The inferior vena cava is dilated in size with greater than 50% respiratory variability, suggesting right atrial pressure of 8 mmHg. IAS/Shunts: No atrial level shunt detected by color flow Doppler. Agitated saline contrast was given intravenously to evaluate for intracardiac shunting. Agitated saline contrast bubble study was negative, with no evidence of any interatrial shunt.   Diastology LV e' medial:    9.68 cm/s LV E/e' medial:  9.6 LV e' lateral:   7.83 cm/s LV E/e' lateral: 11.9  RIGHT VENTRICLE             IVC RV S prime:     10.20 cm/s  IVC diam: 2.40 cm TAPSE (M-mode): 2.3 cm LEFT ATRIUM             Index        RIGHT ATRIUM           Index LA Vol (A2C):   45.0 ml 25.63 ml/m  RA Area:     11.40 cm  LA Vol (A4C):   45.8 ml 26.09 ml/m  RA Volume:   23.00 ml  13.10 ml/m LA Biplane Vol: 45.9 ml 26.15 ml/m  AORTIC VALVE AV Vmax:      151.00 cm/s AV Peak Grad: 9.1 mmHg LVOT Vmax:    112.00 cm/s LVOT Vmean:   66.700 cm/s LVOT VTI:     0.211 m MITRAL VALVE MV Area (PHT): 5.13 cm    SHUNTS MV Decel Time: 148 msec    Systemic VTI: 0.21 m  MV E velocity: 93.00 cm/s MV A velocity: 96.40 cm/s MV E/A ratio:  0.96 Carolan Clines Electronically signed by Carolan Clines Signature Date/Time: 09/20/2023/3:09:27 PM    Final    CT Angio Chest PE W/Cm &/Or Wo Cm  Result Date: 09/19/2023 CLINICAL DATA:  Shortness of breath EXAM: CT ANGIOGRAPHY CHEST WITH CONTRAST TECHNIQUE: Multidetector CT imaging of the chest was performed using the standard protocol during bolus administration of intravenous contrast. Multiplanar CT image reconstructions and MIPs were obtained to evaluate the vascular anatomy. RADIATION DOSE REDUCTION: This exam was performed according to the departmental dose-optimization program which includes automated exposure control, adjustment of the mA and/or kV according to patient size and/or use of iterative reconstruction technique. CONTRAST:  75mL OMNIPAQUE IOHEXOL 350 MG/ML SOLN COMPARISON:  Chest x-ray 927 2024 FINDINGS: Cardiovascular: Satisfactory opacification of the pulmonary arteries to the segmental level. Attenuated vasculature in the right upper lobe. No evidence of pulmonary embolism. Normal heart size. No pericardial effusion. Nonaneurysmal aorta. Moderate atherosclerosis. No dissection. Mediastinum/Nodes: Midline trachea. No thyroid mass. No suspicious lymph nodes. Esophagus within normal limits. Lungs/Pleura: Advanced emphysema with large right greater than left apical bulla and hyperexpansion of the right upper lobe. No acute airspace disease, pleural effusion or pneumothorax. Upper Abdomen: No acute finding Musculoskeletal: No acute or suspicious osseous abnormality Review of the MIP images confirms  the above findings. IMPRESSION: 1. Negative for acute pulmonary embolus. 2. Advanced emphysema with large right greater than left apical bulla and hyperexpansion of the right upper lobe. No pneumothorax Aortic Atherosclerosis (ICD10-I70.0) and Emphysema (ICD10-J43.9). Electronically Signed   By: Jasmine Pang M.D.   On: 09/19/2023 20:19   DG Chest 2 View  Result Date: 09/19/2023 CLINICAL DATA:  Shortness of breath and congestion. EXAM: CHEST - 2 VIEW COMPARISON:  02/03/2019 FINDINGS: Lungs are markedly hyperexpanded. Lucency in the lung apices is progressive in the interval suggesting bullous changes superimposed on emphysema. Vertical linear density in the right upper lung raises the question of a pneumothorax given the background lucency. If this is a pneumothorax, orientation would suggest that a portion of the right upper lobe is scarred to the chest wall. Alternatively, this could be the wall of the large bulla. Overlying sheet or clothing could also generate this appearance. Dense airspace consolidation is seen at the right base. No substantial pleural effusion. No acute bony abnormality. Telemetry leads overlie the chest. IMPRESSION: 1. Marked hyperexpansion with progressive lucency in the lung apices suggesting bullous changes superimposed on emphysema. 2. Vertical linear density in the right upper lung raises the question of a pneumothorax given the background lucency. If this is a pneumothorax, orientation of this pleural line would suggest that a portion of the right upper lobe is scarred to the chest wall. Alternatively, this could be the wall of the large bulla. Overlying sheet or clothing could also generate this appearance. CT chest without contrast would likely prove quite helpful to further evaluate. 3. Dense airspace consolidation at the right base compatible with pneumonia. Electronically Signed   By: Kennith Center M.D.   On: 09/19/2023 18:31     Medical Consultants:    None.   Subjective:    MAHALAH MONTELLANO relates her breathing is not improved.  Objective:    Vitals:   09/20/23 2002 09/20/23 2234 09/21/23 0713 09/21/23 0826  BP: (!) 114/46  (!) 96/55   Pulse: 95  92   Resp: 18  20   Temp: 98.1 F (36.7 C)  97.9 F (36.6 C)  TempSrc: Oral  Oral   SpO2: 99% 98% 97% 100%  Weight:      Height:       SpO2: 100 % O2 Flow Rate (L/min): (S) 3 L/min (Weaned to 2 L [place].) FiO2 (%): 32 %   Intake/Output Summary (Last 24 hours) at 09/21/2023 0913 Last data filed at 09/20/2023 1726 Gross per 24 hour  Intake 872.2 ml  Output --  Net 872.2 ml   Filed Weights   09/19/23 1654  Weight: 70.3 kg    Exam: General exam: In no acute distress. Respiratory system: Good air movement and clear to auscultation. Cardiovascular system: S1 & S2 heard, RRR. No JVD. Gastrointestinal system: Abdomen is nondistended, soft and nontender.  Extremities: No pedal edema. Skin: No rashes, lesions or ulcers Psychiatry: Judgement and insight appear normal. Mood & affect appropriate.  Data Reviewed:    Labs: Basic Metabolic Panel: Recent Labs  Lab 09/19/23 1752 09/20/23 0446  NA 141 140  K 3.2* 4.1  CL 106 105  CO2 24 25  GLUCOSE 146* 160*  BUN 7 8  CREATININE 0.73 0.81  CALCIUM 8.9 8.6*   GFR Estimated Creatinine Clearance: 72.8 mL/min (by C-G formula based on SCr of 0.81 mg/dL). Liver Function Tests: Recent Labs  Lab 09/19/23 1752  AST 21  ALT 18  ALKPHOS 118  BILITOT 0.5  PROT 7.4  ALBUMIN 3.6   No results for input(s): "LIPASE", "AMYLASE" in the last 168 hours. No results for input(s): "AMMONIA" in the last 168 hours. Coagulation profile Recent Labs  Lab 09/20/23 0446  INR 1.1   COVID-19 Labs  No results for input(s): "DDIMER", "FERRITIN", "LDH", "CRP" in the last 72 hours.  Lab Results  Component Value Date   SARSCOV2NAA NEGATIVE 09/19/2023   SARSCOV2NAA NEGATIVE 08/25/2021   SARSCOV2NAA Not Detected 11/15/2019     CBC: Recent Labs  Lab 09/19/23 1752 09/20/23 0446  WBC 9.6 6.4  NEUTROABS 8.0*  --   HGB 12.2 11.4*  HCT 36.0 35.2*  MCV 93.5 96.7  PLT 262 247   Cardiac Enzymes: No results for input(s): "CKTOTAL", "CKMB", "CKMBINDEX", "TROPONINI" in the last 168 hours. BNP (last 3 results) No results for input(s): "PROBNP" in the last 8760 hours. CBG: No results for input(s): "GLUCAP" in the last 168 hours. D-Dimer: No results for input(s): "DDIMER" in the last 72 hours. Hgb A1c: No results for input(s): "HGBA1C" in the last 72 hours. Lipid Profile: No results for input(s): "CHOL", "HDL", "LDLCALC", "TRIG", "CHOLHDL", "LDLDIRECT" in the last 72 hours. Thyroid function studies: Recent Labs    09/20/23 0446  TSH 0.484   Anemia work up: No results for input(s): "VITAMINB12", "FOLATE", "FERRITIN", "TIBC", "IRON", "RETICCTPCT" in the last 72 hours. Sepsis Labs: Recent Labs  Lab 09/19/23 1752 09/19/23 1805 09/19/23 1929 09/20/23 0446  WBC 9.6  --   --  6.4  LATICACIDVEN  --  1.5 1.5  --    Microbiology Recent Results (from the past 240 hour(s))  Respiratory (~20 pathogens) panel by PCR     Status: Abnormal   Collection Time: 09/19/23  9:19 PM   Specimen: Nasopharyngeal Swab; Respiratory  Result Value Ref Range Status   Adenovirus NOT DETECTED NOT DETECTED Final   Coronavirus 229E NOT DETECTED NOT DETECTED Final    Comment: (NOTE) The Coronavirus on the Respiratory Panel, DOES NOT test for the novel  Coronavirus (2019 nCoV)    Coronavirus HKU1 NOT DETECTED NOT DETECTED Final   Coronavirus NL63 NOT DETECTED NOT DETECTED  Final   Coronavirus OC43 NOT DETECTED NOT DETECTED Final   Metapneumovirus NOT DETECTED NOT DETECTED Final   Rhinovirus / Enterovirus DETECTED (A) NOT DETECTED Final   Influenza A NOT DETECTED NOT DETECTED Final   Influenza B NOT DETECTED NOT DETECTED Final   Parainfluenza Virus 1 NOT DETECTED NOT DETECTED Final   Parainfluenza Virus 2 NOT DETECTED NOT  DETECTED Final   Parainfluenza Virus 3 NOT DETECTED NOT DETECTED Final   Parainfluenza Virus 4 NOT DETECTED NOT DETECTED Final   Respiratory Syncytial Virus NOT DETECTED NOT DETECTED Final   Bordetella pertussis NOT DETECTED NOT DETECTED Final   Bordetella Parapertussis NOT DETECTED NOT DETECTED Final   Chlamydophila pneumoniae NOT DETECTED NOT DETECTED Final   Mycoplasma pneumoniae NOT DETECTED NOT DETECTED Final    Comment: Performed at [facility], [address] [place], [place] [address]  SARS Coronavirus 2 by RT PCR (hospital order, performed in [facility] hospital lab) *cepheid single result test* Nasopharyngeal Swab     Status: None   Collection Time: 09/19/23  9:19 PM   Specimen: Nasopharyngeal Swab; Nasal Swab  Result Value Ref Range Status   SARS Coronavirus 2 by RT PCR NEGATIVE NEGATIVE Final    Comment: (NOTE) SARS-CoV-2 target nucleic acids are NOT DETECTED.  The SARS-CoV-2 RNA is generally detectable in upper and lower respiratory specimens during the acute phase of infection. The lowest concentration of SARS-CoV-2 viral copies this assay can detect is 250 copies / mL. A negative result does not preclude SARS-CoV-2 infection and should not be used as the sole basis for treatment or other patient management decisions.  A negative result may occur with improper specimen collection / handling, submission of specimen other than nasopharyngeal swab, presence of viral mutation(s) within the areas targeted by this assay, and inadequate number of viral copies (<250 copies / mL). A negative result must be combined with clinical observations, patient history, and epidemiological information.  Fact Sheet for Patients:   RoadLapTop.co.za  Fact Sheet for Healthcare Providers: http://kim-miller.com/  This test is not yet approved or  cleared by the [place] FDA and has been authorized for detection and/or diagnosis of  SARS-CoV-2 by FDA under an Emergency Use Authorization (EUA).  This EUA will remain in effect (meaning this test can be used) for the duration of the COVID-19 declaration under Section 564(b)(1) of the Act, 21 U.S.C. section 360bbb-3(b)(1), unless the authorization is terminated or revoked sooner.  Performed at [facility], [address], [place], [place] [address]   Culture, blood (Routine X 2) w Reflex to ID Panel     Status: None (Preliminary result)   Collection Time: 09/19/23 10:51 PM   Specimen: BLOOD  Result Value Ref Range Status   Specimen Description   Final    BLOOD BLOOD LEFT ARM AEROBIC BOTTLE ONLY Performed at [facility], [address] [place], [place] [address]    Special Requests   Final    BOTTLES DRAWN AEROBIC ONLY Blood Culture adequate volume Performed at [facility], [address], [place], [place] [address]    Culture   Final    NO GROWTH < 12 HOURS Performed at [facility], [address] [place], [place] [address]    Report Status PENDING  Incomplete  Culture, blood (Routine X 2) w Reflex to ID Panel     Status: None (Preliminary result)   Collection Time: 09/19/23 10:51 PM   Specimen: BLOOD  Result Value Ref Range Status   Specimen Description   Final    BLOOD BLOOD LEFT ARM Performed at [facility], [address] [place], [place] [address]    Special Requests   Final    BOTTLES DRAWN AEROBIC ONLY Blood Culture adequate volume Performed at [facility], [address] [place], [place] [address]    Culture   Final    NO GROWTH < 12 HOURS Performed at [facility], [address], [place], [place] [address]    Report Status PENDING  Incomplete     Medications:    albuterol  2.5 mg Inhalation QID   clonazePAM  2 mg Oral QHS   enoxaparin (LOVENOX) injection  40 mg Subcutaneous Q24H   fluticasone furoate-vilanterol  1 puff  Inhalation Daily   methylPREDNISolone (SOLU-MEDROL) injection  40 mg Intravenous Q12H   pantoprazole  20 mg Oral Daily   [START ON 09/22/2023] predniSONE  50 mg Oral Q breakfast   rosuvastatin  20 mg Oral Daily   sodium chloride flush  3 mL Intravenous Q12H   Continuous Infusions:  doxycycline (VIBRAMYCIN) IV 100 mg (09/20/23 2152)      LOS: 2 days   Marinda Elk  [facility]  09/21/2023, 9:13 AM

## 2023-09-21 NOTE — Plan of Care (Signed)

## 2023-09-21 NOTE — Plan of Care (Signed)

## 2023-09-22 DIAGNOSIS — J441 Chronic obstructive pulmonary disease with (acute) exacerbation: Secondary | ICD-10-CM | POA: Diagnosis not present

## 2023-09-22 DIAGNOSIS — J9601 Acute respiratory failure with hypoxia: Secondary | ICD-10-CM | POA: Diagnosis not present

## 2023-09-22 MED ORDER — COSYNTROPIN 0.25 MG IJ SOLR
0.2500 mg | Freq: Once | INTRAMUSCULAR | Status: AC
  Administered 2023-09-23: 0.25 mg via INTRAVENOUS
  Filled 2023-09-22: qty 0.25

## 2023-09-22 MED ORDER — FLUDROCORTISONE ACETATE 0.1 MG PO TABS
0.1000 mg | ORAL_TABLET | Freq: Every day | ORAL | Status: DC
  Administered 2023-09-22 – 2023-09-24 (×3): 0.1 mg via ORAL
  Filled 2023-09-22 (×3): qty 1

## 2023-09-22 NOTE — Plan of Care (Signed)

## 2023-09-22 NOTE — Progress Notes (Signed)
[facility] PROGRESS NOTE    Progress Note  Tracy Gray  ZYS:[id number] DOB: February 21, 1966 DOA: 09/19/2023 PCP: Burton Apley, MD     Brief Narrative:   Tracy Gray is an 57 y.o. female past medical history of COPD, 40-year pack history quit about 4 years ago, relate that about 2 weeks ago started having nasal congestion and sinus drainage followed by productive cough and shortness of breath initially exertional but now at rest came into the ED found to be hypoxic placed on 3 L of oxygen.  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 Still requiring 2 L of oxygen to keep saturations greater 96%. Out of bed to chair, wean to room air. She has remained febrile no leukocytosis. Respiratory virus is positive for rhinovirus.  Hypotension/essential hypertension: Continue to hold amlodipine, clonidine, lisinopril and hydrochlorothiazide blood pressure continues to be borderline but stable.  2D echo showed an EF of 60% with grade 1 diastolic dysfunction. Cortisol in the morning was 4.1, will perform cosyntropin test. Concerned about relative adrenal insufficiency.  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  DVT prophylaxis: lovenox Family Communication:none Status is: Inpatient Remains inpatient appropriate because: Acute respiratory failure with hypoxia due to COPD exacerbation    Code Status:     Code Status Orders  (From admission, onward)           Start     Ordered   09/19/23 2119  Full code  Continuous       Question:  By:  Answer:  Consent: discussion documented in EHR   09/19/23 2119           Code Status History     This patient has a current code status but no historical code status.         IV Access:    Peripheral IV   Procedures and diagnostic studies:   ECHOCARDIOGRAM COMPLETE  Result Date: 09/20/2023    ECHOCARDIOGRAM REPORT   Patient Name:   Tracy Gray Date of Exam: 09/20/2023 Medical Rec #:  [id number]         Height:       64.0 in Accession #:    [id number]        Weight:       155.0 lb Date of Birth:  07/26/1966         BSA:          1.755 m Patient Age:    57 years          BP:           108/72 mmHg Patient Gender: F                 HR:           92 bpm. Exam Location:  Inpatient Procedure: 2D Echo, Cardiac Doppler, Color Doppler and Saline Contrast Bubble            Study Indications:    Stroke I63.9  History:        Patient has no prior history of Echocardiogram examinations.                 COPD, Signs/Symptoms:Hypotension and Dyspnea; Risk  Factors:Former Smoker, Sleep Apnea and Dyslipidemia.  Sonographer:    Lucendia Herrlich Referring Phys: [id number] [facility] GOEL  Sonographer Comments: Image acquisition challenging due to respiratory motion. IMPRESSIONS  1. Left ventricular ejection fraction, by estimation, is 60 to 65%. The left ventricle has normal function. The left ventricle has no regional wall motion abnormalities. Left ventricular diastolic parameters are consistent with Grade I diastolic dysfunction (impaired relaxation).  2. Right ventricular systolic function is normal. The right ventricular size is normal. Tricuspid regurgitation signal is inadequate for assessing PA pressure.  3. The mitral valve is normal in structure. No evidence of mitral valve regurgitation.  4. Aortic valve regurgitation is not visualized.  5. Pulmonic valve regurgitation not evaluated.  6. Aortic not evaluated.  7. The inferior vena cava is dilated in size with >50% respiratory variability, suggesting right atrial pressure of 8 mmHg.  8. Agitated saline contrast bubble study was negative, with no evidence of any interatrial shunt. Conclusion(s)/Recommendation(s): No intracardiac source of  embolism detected on this transthoracic study. Consider a transesophageal echocardiogram to exclude cardiac source of embolism if clinically indicated. FINDINGS  Left Ventricle: Left ventricular ejection fraction, by estimation, is 60 to 65%. The left ventricle has normal function. The left ventricle has no regional wall motion abnormalities. The left ventricular internal cavity size was normal in size. There is  no left ventricular hypertrophy. Left ventricular diastolic parameters are consistent with Grade I diastolic dysfunction (impaired relaxation). Right Ventricle: The right ventricular size is normal. Right ventricular systolic function is normal. Tricuspid regurgitation signal is inadequate for assessing PA pressure. Left Atrium: Left atrial size was normal in size. Right Atrium: Right atrial size was normal in size. Pericardium: There is no evidence of pericardial effusion. Mitral Valve: The mitral valve is normal in structure. No evidence of mitral valve regurgitation. Tricuspid Valve: Tricuspid valve regurgitation is not demonstrated. Aortic Valve: Aortic valve regurgitation is not visualized. Aortic valve peak gradient measures 9.1 mmHg. Pulmonic Valve: The pulmonic valve was not well visualized. Pulmonic valve regurgitation not evaluated. Aorta: Not evaluated. Venous: The inferior vena cava is dilated in size with greater than 50% respiratory variability, suggesting right atrial pressure of 8 mmHg. IAS/Shunts: No atrial level shunt detected by color flow Doppler. Agitated saline contrast was given intravenously to evaluate for intracardiac shunting. Agitated saline contrast bubble study was negative, with no evidence of any interatrial shunt.   Diastology LV e' medial:    9.68 cm/s LV E/e' medial:  9.6 LV e' lateral:   7.83 cm/s LV E/e' lateral: 11.9  RIGHT VENTRICLE             IVC RV S prime:     10.20 cm/s  IVC diam: 2.40 cm TAPSE (M-mode): 2.3 cm LEFT ATRIUM             Index        RIGHT ATRIUM            Index LA Vol (A2C):   45.0 ml 25.63 ml/m  RA Area:     11.40 cm LA Vol (A4C):   45.8 ml 26.09 ml/m  RA Volume:   23.00 ml  13.10 ml/m LA Biplane Vol: 45.9 ml 26.15 ml/m  AORTIC VALVE AV Vmax:      151.00 cm/s AV Peak Grad: 9.1 mmHg LVOT Vmax:    112.00 cm/s LVOT Vmean:   66.700 cm/s LVOT VTI:     0.211 m MITRAL VALVE MV Area (PHT): 5.13 cm    SHUNTS MV  Decel Time: 148 msec    Systemic VTI: 0.21 m MV E velocity: 93.00 cm/s MV A velocity: 96.40 cm/s MV E/A ratio:  0.96 Carolan Clines Electronically signed by Carolan Clines Signature Date/Time: 09/20/2023/3:09:27 PM    Final      Medical Consultants:   None.   Subjective:    Tracy Gray relates her breathing is not improved.  Objective:    Vitals:   09/21/23 1957 09/22/23 0450 09/22/23 0559 09/22/23 0829  BP: 136/69 (!) 90/52 100/65   Pulse: 86 92 75   Resp: 17 18 18    Temp: 97.8 F (36.6 C) (!) 97.4 F (36.3 C)    TempSrc: Oral Oral    SpO2: 100% 96% 100% 100%  Weight:      Height:       SpO2: 100 % O2 Flow Rate (L/min): 2 L/min FiO2 (%): 28 %   Intake/Output Summary (Last 24 hours) at 09/22/2023 0909 Last data filed at 09/21/2023 1600 Gross per 24 hour  Intake 814.69 ml  Output --  Net 814.69 ml   Filed Weights   09/19/23 1654  Weight: 70.3 kg    Exam: General exam: In no acute distress. Respiratory system: Good air movement and wheezing bilaterally. Cardiovascular system: S1 & S2 heard, RRR. No JVD. Gastrointestinal system: Abdomen is nondistended, soft and nontender.  Extremities: No pedal edema. Skin: No rashes, lesions or ulcers Psychiatry: Judgement and insight appear normal. Mood & affect appropriate. Data Reviewed:    Labs: Basic Metabolic Panel: Recent Labs  Lab 09/19/23 1752 09/20/23 0446  NA 141 140  K 3.2* 4.1  CL 106 105  CO2 24 25  GLUCOSE 146* 160*  BUN 7 8  CREATININE 0.73 0.81  CALCIUM 8.9 8.6*   GFR Estimated Creatinine Clearance: 72.8 mL/min (by C-G formula based on  SCr of 0.81 mg/dL). Liver Function Tests: Recent Labs  Lab 09/19/23 1752  AST 21  ALT 18  ALKPHOS 118  BILITOT 0.5  PROT 7.4  ALBUMIN 3.6   No results for input(s): "LIPASE", "AMYLASE" in the last 168 hours. No results for input(s): "AMMONIA" in the last 168 hours. Coagulation profile Recent Labs  Lab 09/20/23 0446  INR 1.1   COVID-19 Labs  No results for input(s): "DDIMER", "FERRITIN", "LDH", "CRP" in the last 72 hours.  Lab Results  Component Value Date   SARSCOV2NAA NEGATIVE 09/19/2023   SARSCOV2NAA NEGATIVE 08/25/2021   SARSCOV2NAA Not Detected 11/15/2019    CBC: Recent Labs  Lab 09/19/23 1752 09/20/23 0446  WBC 9.6 6.4  NEUTROABS 8.0*  --   HGB 12.2 11.4*  HCT 36.0 35.2*  MCV 93.5 96.7  PLT 262 247   Cardiac Enzymes: No results for input(s): "CKTOTAL", "CKMB", "CKMBINDEX", "TROPONINI" in the last 168 hours. BNP (last 3 results) No results for input(s): "PROBNP" in the last 8760 hours. CBG: No results for input(s): "GLUCAP" in the last 168 hours. D-Dimer: No results for input(s): "DDIMER" in the last 72 hours. Hgb A1c: No results for input(s): "HGBA1C" in the last 72 hours. Lipid Profile: No results for input(s): "CHOL", "HDL", "LDLCALC", "TRIG", "CHOLHDL", "LDLDIRECT" in the last 72 hours. Thyroid function studies: Recent Labs    09/20/23 0446  TSH 0.484   Anemia work up: No results for input(s): "VITAMINB12", "FOLATE", "FERRITIN", "TIBC", "IRON", "RETICCTPCT" in the last 72 hours. Sepsis Labs: Recent Labs  Lab 09/19/23 1752 09/19/23 1805 09/19/23 1929 09/20/23 0446  WBC 9.6  --   --  6.4  LATICACIDVEN  --  1.5 1.5  --    Microbiology Recent Results (from the past 240 hour(s))  Respiratory (~20 pathogens) panel by PCR     Status: Abnormal   Collection Time: 09/19/23  9:19 PM   Specimen: Nasopharyngeal Swab; Respiratory  Result Value Ref Range Status   Adenovirus NOT DETECTED NOT DETECTED Final   Coronavirus 229E NOT DETECTED NOT  DETECTED Final    Comment: (NOTE) The Coronavirus on the Respiratory Panel, DOES NOT test for the novel  Coronavirus (2019 nCoV)    Coronavirus HKU1 NOT DETECTED NOT DETECTED Final   Coronavirus NL63 NOT DETECTED NOT DETECTED Final   Coronavirus OC43 NOT DETECTED NOT DETECTED Final   Metapneumovirus NOT DETECTED NOT DETECTED Final   Rhinovirus / Enterovirus DETECTED (A) NOT DETECTED Final   Influenza A NOT DETECTED NOT DETECTED Final   Influenza B NOT DETECTED NOT DETECTED Final   Parainfluenza Virus 1 NOT DETECTED NOT DETECTED Final   Parainfluenza Virus 2 NOT DETECTED NOT DETECTED Final   Parainfluenza Virus 3 NOT DETECTED NOT DETECTED Final   Parainfluenza Virus 4 NOT DETECTED NOT DETECTED Final   Respiratory Syncytial Virus NOT DETECTED NOT DETECTED Final   Bordetella pertussis NOT DETECTED NOT DETECTED Final   Bordetella Parapertussis NOT DETECTED NOT DETECTED Final   Chlamydophila pneumoniae NOT DETECTED NOT DETECTED Final   Mycoplasma pneumoniae NOT DETECTED NOT DETECTED Final    Comment: Performed at [facility], [address] [place], [place] [address]  SARS Coronavirus 2 by RT PCR (hospital order, performed in [facility] [place] hospital lab) *cepheid single result test* Nasopharyngeal Swab     Status: None   Collection Time: 09/19/23  9:19 PM   Specimen: Nasopharyngeal Swab; Nasal Swab  Result Value Ref Range Status   SARS Coronavirus 2 by RT PCR NEGATIVE NEGATIVE Final    Comment: (NOTE) SARS-CoV-2 target nucleic acids are NOT DETECTED.  The SARS-CoV-2 RNA is generally detectable in upper and lower respiratory specimens during the acute phase of infection. The lowest concentration of SARS-CoV-2 viral copies this assay can detect is 250 copies / mL. A negative result does not preclude SARS-CoV-2 infection and should not be used as the sole basis for treatment or other patient management decisions.  A negative result may occur with improper specimen collection /  handling, submission of specimen other than nasopharyngeal swab, presence of viral mutation(s) within the areas targeted by this assay, and inadequate number of viral copies (<250 copies / mL). A negative result must be combined with clinical observations, patient history, and epidemiological information.  Fact Sheet for Patients:   RoadLapTop.co.za  Fact Sheet for Healthcare Providers: http://kim-miller.com/  This test is not yet approved or  cleared by the [place] FDA and has been authorized for detection and/or diagnosis of SARS-CoV-2 by FDA under an Emergency Use Authorization (EUA).  This EUA will remain in effect (meaning this test can be used) for the duration of the COVID-19 declaration under Section 564(b)(1) of the Act, 21 U.S.C. section 360bbb-3(b)(1), unless the authorization is terminated or revoked sooner.  Performed at [facility], [address], [place], [place] [address]   Culture, blood (Routine X 2) w Reflex to ID Panel     Status: None (Preliminary result)   Collection Time: 09/19/23 10:51 PM   Specimen: BLOOD  Result Value Ref Range Status   Specimen Description   Final    BLOOD BLOOD LEFT ARM AEROBIC BOTTLE ONLY Performed at [facility], [address] [place], [place] [address]    Special Requests   Final    BOTTLES DRAWN AEROBIC ONLY Blood Culture adequate volume Performed at [facility], [address] [place], [place] [address]    Culture   Final    NO GROWTH 3 DAYS Performed at [facility], [address] [place], [place] [address]    Report Status PENDING  Incomplete  Culture, blood (Routine X 2) w Reflex to ID Panel     Status: None (Preliminary result)   Collection Time: 09/19/23 10:51 PM   Specimen: BLOOD  Result Value Ref Range Status   Specimen Description   Final    BLOOD BLOOD LEFT ARM Performed at [facility], [address] [place], [place] [address]    Special Requests   Final    BOTTLES DRAWN AEROBIC ONLY Blood Culture adequate volume Performed at [facility], [address], [place], [place] [address]    Culture   Final    NO GROWTH 3 DAYS Performed at [facility], [address], [place], [place] [address]    Report Status PENDING  Incomplete     Medications:    albuterol  2.5 mg Inhalation QID   clonazePAM  2 mg Oral QHS   enoxaparin (LOVENOX) injection  40 mg Subcutaneous Q24H   fluticasone furoate-vilanterol  1 puff Inhalation Daily   pantoprazole  20 mg Oral Daily   predniSONE  50 mg Oral Q breakfast   rosuvastatin  20 mg Oral Daily   sodium chloride flush  3 mL Intravenous Q12H   Continuous Infusions:  doxycycline (VIBRAMYCIN) IV 100 mg (09/21/23 2141)      LOS: 3 days   Marinda Elk  [facility]  09/22/2023, 9:09 AM

## 2023-09-22 NOTE — Progress Notes (Signed)
Pt requested Dr. David Stall from [facility] in [facility], to be added into pt's chart records.  Pt states this is the doctor that took care of her 10 years ago when she had her HIV testing completed and states it had all came back negative at that time. Phone number is [phone].

## 2023-09-23 DIAGNOSIS — I9589 Other hypotension: Secondary | ICD-10-CM

## 2023-09-23 DIAGNOSIS — J9601 Acute respiratory failure with hypoxia: Secondary | ICD-10-CM | POA: Diagnosis not present

## 2023-09-23 DIAGNOSIS — J441 Chronic obstructive pulmonary disease with (acute) exacerbation: Secondary | ICD-10-CM | POA: Diagnosis not present

## 2023-09-23 LAB — HIV-1/HIV-2 QUALITATIVE RNA
Final Interpretation: NEGATIVE
HIV-1 RNA, Qualitative: NONREACTIVE
HIV-2 RNA, Qualitative: NONREACTIVE

## 2023-09-23 LAB — HCV AB W REFLEX TO QUANT PCR: HCV Ab: NONREACTIVE

## 2023-09-23 LAB — HCV INTERPRETATION

## 2023-09-23 LAB — ACTH STIMULATION, 3 TIME POINTS
Cortisol, 30 Min: 11 ug/dL
Cortisol, 60 Min: 15.8 ug/dL
Cortisol, Base: 4.1 ug/dL

## 2023-09-23 LAB — HIV-1/2 AB - DIFFERENTIATION
HIV 1 Ab: NONREACTIVE
HIV 2 Ab: NONREACTIVE
Note: NEGATIVE

## 2023-09-23 LAB — HEPATITIS B SURFACE ANTIBODY, QUANTITATIVE: Hep B S AB Quant (Post): <3.5 m[IU]/mL — ABNORMAL LOW

## 2023-09-23 MED ORDER — DOXYCYCLINE HYCLATE 100 MG PO TABS
100.0000 mg | ORAL_TABLET | Freq: Two times a day (BID) | ORAL | Status: DC
  Administered 2023-09-23 – 2023-09-24 (×3): 100 mg via ORAL
  Filled 2023-09-23 (×3): qty 1

## 2023-09-23 MED ORDER — PHENOL 1.4 % MT LIQD
1.0000 | OROMUCOSAL | Status: DC | PRN
  Administered 2023-09-23 (×2): 1 via OROMUCOSAL
  Filled 2023-09-23: qty 177

## 2023-09-23 NOTE — Plan of Care (Signed)

## 2023-09-23 NOTE — Plan of Care (Signed)
  Problem: Clinical Measurements: Goal: Will remain free from infection Outcome: Progressing Goal: Cardiovascular complication will be avoided Outcome: Progressing   Problem: Activity: Goal: Risk for activity intolerance will decrease Outcome: Progressing   Problem: Nutrition: Goal: Adequate nutrition will be maintained Outcome: Progressing   Problem: Coping: Goal: Level of anxiety will decrease Outcome: Progressing   Problem: Elimination: Goal: Will not experience complications related to bowel motility Outcome: Progressing Goal: Will not experience complications related to urinary retention Outcome: Progressing   Problem: Pain Managment: Goal: General experience of comfort will improve Outcome: Progressing   Problem: Safety: Goal: Ability to remain free from injury will improve Outcome: Progressing   Problem: Skin Integrity: Goal: Risk for impaired skin integrity will decrease Outcome: Progressing   Problem: Clinical Measurements: Goal: Respiratory complications will improve Outcome: Not Progressing

## 2023-09-23 NOTE — Progress Notes (Signed)
[facility] PROGRESS NOTE    Progress Note  Tracy Gray  ZOX:[id number] DOB: [birth year] DOA: 09/19/2023 PCP: Burton Apley, MD     Brief Narrative:   Tracy Gray is an 57 y.o. female past medical history of COPD, 40-year pack history quit about 4 years ago, relate that about 2 weeks ago started having nasal congestion and sinus drainage followed by productive cough and shortness of breath initially exertional but now at rest came into the ED found to be hypoxic placed on 3 L of oxygen.  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  Steroids and antibiotics have been transition to oral Still requiring 2 L of oxygen to keep saturations greater 96%.  Please wean to room air. Out of bed to chair, wean to room air. Ambulate and check saturations with ambulation. She has remained febrile no leukocytosis. Respiratory virus is positive for rhinovirus.  Hypotension/essential hypertension: Continue to hold amlodipine, clonidine, lisinopril and hydrochlorothiazide blood pressure was low to borderline on admission and she related orthostatic symptoms, she was placed on steroids for her COPD exacerbation admission his blood pressure has slowly trended up She relates she has been giving clonidine as needed for an elevated blood pressure. She relates she has been on multiple courses over the last several years of steroid for COPD and sinusitis, she relates more than 10 treatments. 2D echo showed an EF of 60% with grade 1 diastolic dysfunction. Cortisol in the morning was 4.1, will perform cosyntropin test. Concerned about relative adrenal insufficiency.  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  DVT prophylaxis: lovenox Family Communication:none Status is:  Inpatient Remains inpatient appropriate because: Acute respiratory failure with hypoxia due to COPD exacerbation    Code Status:     Code Status Orders  (From admission, onward)           Start     Ordered   09/19/23 2119  Full code  Continuous       Question:  By:  Answer:  Consent: discussion documented in EHR   09/19/23 2119           Code Status History     This patient has a current code status but no historical code status.         IV Access:   Peripheral IV   Procedures and diagnostic studies:   No results found.   Medical Consultants:   None.   Subjective:    Tracy Gray relates her breathing is about the same.  Objective:    Vitals:   09/22/23 2115 09/23/23 0408 09/23/23 0631 09/23/23 0823  BP:  124/76    Pulse:  81 95   Resp:  (!) 23 (!) 24   Temp:  97.6 F (36.4 C)    TempSrc:  Oral    SpO2: 97% 97% 97% 98%  Weight:      Height:       SpO2: 98 % O2 Flow Rate (L/min): 2 L/min FiO2 (%): 28 %  No intake or output data in the 24 hours ending 09/23/23 0925  Filed Weights   09/19/23 1654  Weight: 70.3 kg    Exam: General exam: In no acute distress. Respiratory system: Good air movement and clear to auscultation. Cardiovascular system: S1 & S2 heard,  RRR. No JVD. Gastrointestinal system: Abdomen is nondistended, soft and nontender.  Extremities: No pedal edema. Skin: No rashes, lesions or ulcers Psychiatry: Judgement and insight appear normal. Mood & affect appropriate. Data Reviewed:    Labs: Basic Metabolic Panel: Recent Labs  Lab 09/19/23 1752 09/20/23 0446  NA 141 140  K 3.2* 4.1  CL 106 105  CO2 24 25  GLUCOSE 146* 160*  BUN 7 8  CREATININE 0.73 0.81  CALCIUM 8.9 8.6*   GFR Estimated Creatinine Clearance: 72.8 mL/min (by C-G formula based on SCr of 0.81 mg/dL). Liver Function Tests: Recent Labs  Lab 09/19/23 1752  AST 21  ALT 18  ALKPHOS 118  BILITOT 0.5  PROT 7.4  ALBUMIN 3.6   No  results for input(s): "LIPASE", "AMYLASE" in the last 168 hours. No results for input(s): "AMMONIA" in the last 168 hours. Coagulation profile Recent Labs  Lab 09/20/23 0446  INR 1.1   COVID-19 Labs  No results for input(s): "DDIMER", "FERRITIN", "LDH", "CRP" in the last 72 hours.  Lab Results  Component Value Date   SARSCOV2NAA NEGATIVE 09/19/2023   SARSCOV2NAA NEGATIVE 08/25/2021   SARSCOV2NAA Not Detected 11/15/2019    CBC: Recent Labs  Lab 09/19/23 1752 09/20/23 0446  WBC 9.6 6.4  NEUTROABS 8.0*  --   HGB 12.2 11.4*  HCT 36.0 35.2*  MCV 93.5 96.7  PLT 262 247   Cardiac Enzymes: No results for input(s): "CKTOTAL", "CKMB", "CKMBINDEX", "TROPONINI" in the last 168 hours. BNP (last 3 results) No results for input(s): "PROBNP" in the last 8760 hours. CBG: No results for input(s): "GLUCAP" in the last 168 hours. D-Dimer: No results for input(s): "DDIMER" in the last 72 hours. Hgb A1c: No results for input(s): "HGBA1C" in the last 72 hours. Lipid Profile: No results for input(s): "CHOL", "HDL", "LDLCALC", "TRIG", "CHOLHDL", "LDLDIRECT" in the last 72 hours. Thyroid function studies: No results for input(s): "TSH", "T4TOTAL", "T3FREE", "THYROIDAB" in the last 72 hours.  Invalid input(s): "FREET3"  Anemia work up: No results for input(s): "VITAMINB12", "FOLATE", "FERRITIN", "TIBC", "IRON", "RETICCTPCT" in the last 72 hours. Sepsis Labs: Recent Labs  Lab 09/19/23 1752 09/19/23 1805 09/19/23 1929 09/20/23 0446  WBC 9.6  --   --  6.4  LATICACIDVEN  --  1.5 1.5  --    Microbiology Recent Results (from the past 240 hour(s))  Respiratory (~20 pathogens) panel by PCR     Status: Abnormal   Collection Time: 09/19/23  9:19 PM   Specimen: Nasopharyngeal Swab; Respiratory  Result Value Ref Range Status   Adenovirus NOT DETECTED NOT DETECTED Final   Coronavirus 229E NOT DETECTED NOT DETECTED Final    Comment: (NOTE) The Coronavirus on the Respiratory Panel, DOES  NOT test for the novel  Coronavirus (2019 nCoV)    Coronavirus HKU1 NOT DETECTED NOT DETECTED Final   Coronavirus NL63 NOT DETECTED NOT DETECTED Final   Coronavirus OC43 NOT DETECTED NOT DETECTED Final   Metapneumovirus NOT DETECTED NOT DETECTED Final   Rhinovirus / Enterovirus DETECTED (A) NOT DETECTED Final   Influenza A NOT DETECTED NOT DETECTED Final   Influenza B NOT DETECTED NOT DETECTED Final   Parainfluenza Virus 1 NOT DETECTED NOT DETECTED Final   Parainfluenza Virus 2 NOT DETECTED NOT DETECTED Final   Parainfluenza Virus 3 NOT DETECTED NOT DETECTED Final   Parainfluenza Virus 4 NOT DETECTED NOT DETECTED Final   Respiratory Syncytial Virus NOT DETECTED NOT DETECTED Final   Bordetella pertussis NOT DETECTED NOT DETECTED Final  Bordetella Parapertussis NOT DETECTED NOT DETECTED Final   Chlamydophila pneumoniae NOT DETECTED NOT DETECTED Final   Mycoplasma pneumoniae NOT DETECTED NOT DETECTED Final    Comment: Performed at [facility], [address] [place], [place] [address]  SARS Coronavirus 2 by RT PCR (hospital order, performed in [facility] hospital lab) *cepheid single result test* Nasopharyngeal Swab     Status: None   Collection Time: 09/19/23  9:19 PM   Specimen: Nasopharyngeal Swab; Nasal Swab  Result Value Ref Range Status   SARS Coronavirus 2 by RT PCR NEGATIVE NEGATIVE Final    Comment: (NOTE) SARS-CoV-2 target nucleic acids are NOT DETECTED.  The SARS-CoV-2 RNA is generally detectable in upper and lower respiratory specimens during the acute phase of infection. The lowest concentration of SARS-CoV-2 viral copies this assay can detect is 250 copies / mL. A negative result does not preclude SARS-CoV-2 infection and should not be used as the sole basis for treatment or other patient management decisions.  A negative result may occur with improper specimen collection / handling, submission of specimen other than nasopharyngeal swab, presence of viral  mutation(s) within the areas targeted by this assay, and inadequate number of viral copies (<250 copies / mL). A negative result must be combined with clinical observations, patient history, and epidemiological information.  Fact Sheet for Patients:   RoadLapTop.co.za  Fact Sheet for Healthcare Providers: http://kim-miller.com/  This test is not yet approved or  cleared by the [place] FDA and has been authorized for detection and/or diagnosis of SARS-CoV-2 by FDA under an Emergency Use Authorization (EUA).  This EUA will remain in effect (meaning this test can be used) for the duration of the COVID-19 declaration under Section 564(b)(1) of the Act, 21 U.S.C. section 360bbb-3(b)(1), unless the authorization is terminated or revoked sooner.  Performed at [facility], [address] [place], [place] [address]   Culture, blood (Routine X 2) w Reflex to ID Panel     Status: None (Preliminary result)   Collection Time: 09/19/23 10:51 PM   Specimen: BLOOD  Result Value Ref Range Status   Specimen Description   Final    BLOOD BLOOD LEFT ARM AEROBIC BOTTLE ONLY Performed at [facility], [address], [place], [place] [address]    Special Requests   Final    BOTTLES DRAWN AEROBIC ONLY Blood Culture adequate volume Performed at [facility], [address] [place], [place] [address]    Culture   Final    NO GROWTH 4 DAYS Performed at [facility], [address] [place], [place] [address]    Report Status PENDING  Incomplete  Culture, blood (Routine X 2) w Reflex to ID Panel     Status: None (Preliminary result)   Collection Time: 09/19/23 10:51 PM   Specimen: BLOOD  Result Value Ref Range Status   Specimen Description   Final    BLOOD BLOOD LEFT ARM Performed at [facility] [address] [place], [place] [address]    Special Requests    Final    BOTTLES DRAWN AEROBIC ONLY Blood Culture adequate volume Performed at [facility], [address] [place], [place] [address]    Culture   Final    NO GROWTH 4 DAYS Performed at [facility], [address] [place], [place] [address]    Report Status PENDING  Incomplete     Medications:  albuterol  2.5 mg Inhalation QID   clonazePAM  2 mg Oral QHS   doxycycline  100 mg Oral Q12H   enoxaparin (LOVENOX) injection  40 mg Subcutaneous Q24H   fludrocortisone  0.1 mg Oral Daily   fluticasone furoate-vilanterol  1 puff Inhalation Daily   pantoprazole  20 mg Oral Daily   predniSONE  50 mg Oral Q breakfast   rosuvastatin  20 mg Oral Daily   sodium chloride flush  3 mL Intravenous Q12H   Continuous Infusions:      LOS: 4 days   Marinda Elk  [facility]  09/23/2023, 9:25 AM

## 2023-09-23 NOTE — Progress Notes (Signed)
PHARMACIST - PHYSICIAN COMMUNICATION DR:   David Stall CONCERNING: Antibiotic IV to Oral Route Change Policy  RECOMMENDATION: This patient is receiving doxycycline by the intravenous route.  Based on criteria approved by the Pharmacy and Therapeutics Committee, the antibiotic(s) is/are being converted to the equivalent oral dose form(s).   DESCRIPTION: These criteria include: Patient being treated for a respiratory tract infection, urinary tract infection, cellulitis or clostridium difficile associated diarrhea if on metronidazole The patient is not neutropenic and does not exhibit a GI malabsorption state The patient is eating (either orally or via tube) and/or has been taking other orally administered medications for a least 24 hours The patient is improving clinically and has a Tmax < 100.5  If you have questions about this conversion, please contact the Pharmacy Department  []   [phone] )  [facility] []   [phone] )  [facility]  []   [phone] )  [facility] [x]   [phone] )  [facility]   Thank you for allowing pharmacy to be a part of this patient's care.  Selinda Eon, PharmD, BCPS Clinical Pharmacist [facility] 09/23/2023 7:27 AM

## 2023-09-23 NOTE — Progress Notes (Signed)
Pt ambulated in hallway on room air. O2 sat 85-89% and HR 114-118.

## 2023-09-23 NOTE — Progress Notes (Signed)
Pt had cosyntropin testing scheduled at 0600 this AM order to correlate this with lab.  Lab stated they cannot start this test until after 0800 bc it is a send out, medication will have to be given after the first lab level is drawn after 0800. Med retimed to 0815.

## 2023-09-23 NOTE — TOC Initial Note (Signed)
Transition of Care [facility]) - Initial/Assessment Note    Patient Details  Name: Tracy Gray MRN: [id number] Date of Birth: 1966-06-22  Transition of [facility] CM/SW Contact:    Harriett Sine, RN Phone [phone]  09/23/2023, 3:34 PM  Clinical Narrative:                 Pt from home. Referred by Urgent Care. Pt has pcp, no SDOH needs, TOC following        Patient Goals and CMS Choice            Expected Discharge Plan and Services                                              Prior Living Arrangements/Services                       Activities of Daily Living   ADL Screening (condition at time of admission) Does the patient have a NEW difficulty with bathing/dressing/toileting/self-feeding that is expected to last >3 days?: No Does the patient have a NEW difficulty with getting in/out of bed, walking, or climbing stairs that is expected to last >3 days?: No Does the patient have a NEW difficulty with communication that is expected to last >3 days?: No Is the patient deaf or have difficulty hearing?: No Does the patient have difficulty seeing, even when wearing glasses/contacts?: No Does the patient have difficulty concentrating, remembering, or making decisions?: No  Permission Sought/Granted                  Emotional Assessment              Admission diagnosis:  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] COPD exacerbation (HCC) [J44.1] Patient Active Problem List   Diagnosis Date Noted   Acute respiratory failure with hypoxia (HCC) 09/20/2023   Normocytic anemia 09/20/2023   Hypotension 09/19/2023   Former cigarette  smoker 05/13/2022   Foreign body in throat 08/25/2021   Thrush 02/07/2020   GERD (gastroesophageal reflux disease) 07/21/2019   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   Dysphagia    COPD exacerbation (HCC) 07/16/2016   Obstructive sleep apnea 05/10/2016   COPD  GOLD III with min  reversiblity  03/01/2016   Sinusitis, chronic 03/01/2016   Cigarette smoker 02/03/2016   Upper airway cough syndrome 02/02/2016   Dyspnea 02/02/2016   PCP:  Burton Apley, MD Pharmacy:   [facility] [phone] - [place], [place] - [address] [place] [address] Phone: [phone] Fax: [phone]     Social Determinants of Health (SDOH) Social History: SDOH Screenings   Food Insecurity: No Food Insecurity (09/19/2023)  Housing: Low Risk  (09/19/2023)  Transportation Needs: No Transportation Needs (09/19/2023)  Utilities: Not At Risk (09/19/2023)  Tobacco Use: Medium Risk (09/19/2023)   SDOH Interventions:     Readmission Risk Interventions     No data to display

## 2023-09-24 DIAGNOSIS — J441 Chronic obstructive pulmonary disease with (acute) exacerbation: Secondary | ICD-10-CM | POA: Diagnosis not present

## 2023-09-24 LAB — CULTURE, BLOOD (ROUTINE X 2)
Culture: NO GROWTH
Culture: NO GROWTH
Special Requests: ADEQUATE
Special Requests: ADEQUATE

## 2023-09-24 MED ORDER — IPRATROPIUM-ALBUTEROL 0.5-2.5 (3) MG/3ML IN SOLN: 3.0000 mL | RESPIRATORY_TRACT | 0 refills | Status: AC | PRN

## 2023-09-24 MED ORDER — AMLODIPINE BESYLATE 10 MG PO TABS
10.0000 mg | ORAL_TABLET | Freq: Every day | ORAL | Status: DC
  Administered 2023-09-24 – 2023-09-25 (×2): 10 mg via ORAL
  Filled 2023-09-24 (×2): qty 1

## 2023-09-24 MED ORDER — PREDNISONE 20 MG PO TABS: ORAL_TABLET | ORAL | 0 refills | Status: DC

## 2023-09-24 MED ORDER — GUAIFENESIN ER 600 MG PO TB12: 600.0000 mg | ORAL_TABLET | Freq: Two times a day (BID) | ORAL | 0 refills | Status: AC | PRN

## 2023-09-24 MED ORDER — IPRATROPIUM-ALBUTEROL 0.5-2.5 (3) MG/3ML IN SOLN
3.0000 mL | RESPIRATORY_TRACT | Status: DC
  Administered 2023-09-24 – 2023-09-25 (×5): 3 mL via RESPIRATORY_TRACT
  Filled 2023-09-24 (×5): qty 3

## 2023-09-24 MED ORDER — BENZONATATE 100 MG PO CAPS: 100.0000 mg | ORAL_CAPSULE | Freq: Two times a day (BID) | ORAL | 0 refills | Status: AC

## 2023-09-24 NOTE — Plan of Care (Signed)

## 2023-09-24 NOTE — Progress Notes (Signed)
Mobility Specialist - Progress Note   09/24/23 1119  Mobility  Activity Ambulated with assistance in hallway  Level of Assistance Standby assist, set-up cues, supervision of patient - no hands on  Assistive Device None (hand held)  Distance Ambulated (ft) 90 ft  Range of Motion/Exercises Active  Activity Response Tolerated well  Mobility Referral Yes  $Mobility charge 1 Mobility  Mobility Specialist Start Time (ACUTE ONLY) 1050  Mobility Specialist Stop Time (ACUTE ONLY) 1100  Mobility Specialist Time Calculation (min) (ACUTE ONLY) 10 min   Pt received in bed and agreed to mobility. Had no issues throughout session. Pt O2 saturation hovered around 92% throughout session. Returned to bed with all needs met  Marilynne Halsted Mobility Specialist

## 2023-09-24 NOTE — Plan of Care (Signed)

## 2023-09-24 NOTE — Discharge Summary (Addendum)
laboratory) are listed below for reference.    Significant Diagnostic Studies: ECHOCARDIOGRAM COMPLETE  Result Date: 09/20/2023    ECHOCARDIOGRAM REPORT   Patient Name:   Tracy Gray Date of Exam: 09/20/2023 Medical Rec #:  [id number]         Height:       64.0 in Accession #:    [id number]        Weight:       155.0 lb Date of Birth:  [birth year]         BSA:          1.755 m Patient Age:    57 years          BP:           108/72 mmHg Patient Gender: F                 HR:           92 bpm. Exam Location:  Inpatient Procedure: 2D Echo, Cardiac Doppler, Color Doppler and Saline Contrast Bubble            Study Indications:    Stroke I63.9  History:        Patient has no prior history of Echocardiogram examinations.                 COPD, Signs/Symptoms:Hypotension and Dyspnea; Risk                 Factors:Former Smoker, Sleep Apnea and Dyslipidemia.  Sonographer:    Lucendia Herrlich Referring Phys: [id number] [facility]  Sonographer Comments: Image acquisition challenging due to respiratory motion. IMPRESSIONS  1. Left ventricular ejection fraction, by estimation, is 60 to 65%. The left ventricle has normal function. The left  ventricle has no regional wall motion abnormalities. Left ventricular diastolic parameters are consistent with Grade I diastolic dysfunction (impaired relaxation).  2. Right ventricular systolic function is normal. The right ventricular size is normal. Tricuspid regurgitation signal is inadequate for assessing PA pressure.  3. The mitral valve is normal in structure. No evidence of mitral valve regurgitation.  4. Aortic valve regurgitation is not visualized.  5. Pulmonic valve regurgitation not evaluated.  6. Aortic not evaluated.  7. The inferior vena cava is dilated in size with >50% respiratory variability, suggesting right atrial pressure of 8 mmHg.  8. Agitated saline contrast bubble study was negative, with no evidence of any interatrial shunt. Conclusion(s)/Recommendation(s): No intracardiac source of embolism detected on this transthoracic study. Consider a transesophageal echocardiogram to exclude cardiac source of embolism if clinically indicated. FINDINGS  Left Ventricle: Left ventricular ejection fraction, by estimation, is 60 to 65%. The left ventricle has normal function. The left ventricle has no regional wall motion abnormalities. The left ventricular internal cavity size was normal in size. There is  no left ventricular hypertrophy. Left ventricular diastolic parameters are consistent with Grade I diastolic dysfunction (impaired relaxation). Right Ventricle: The right ventricular size is normal. Right ventricular systolic function is normal. Tricuspid regurgitation signal is inadequate for assessing PA pressure. Left Atrium: Left atrial size was normal in size. Right Atrium: Right atrial size was normal in size. Pericardium: There is no evidence of pericardial effusion. Mitral Valve: The mitral valve is normal in structure. No evidence of mitral valve regurgitation. Tricuspid Valve: Tricuspid valve regurgitation is not demonstrated. Aortic Valve: Aortic valve regurgitation is not visualized. Aortic  valve peak gradient measures 9.1 mmHg. Pulmonic Valve: The pulmonic valve was not well visualized. Pulmonic valve  laboratory) are listed below for reference.    Significant Diagnostic Studies: ECHOCARDIOGRAM COMPLETE  Result Date: 09/20/2023    ECHOCARDIOGRAM REPORT   Patient Name:   Tracy Gray Date of Exam: 09/20/2023 Medical Rec #:  [id number]         Height:       64.0 in Accession #:    [id number]        Weight:       155.0 lb Date of Birth:  [birth year]         BSA:          1.755 m Patient Age:    57 years          BP:           108/72 mmHg Patient Gender: F                 HR:           92 bpm. Exam Location:  Inpatient Procedure: 2D Echo, Cardiac Doppler, Color Doppler and Saline Contrast Bubble            Study Indications:    Stroke I63.9  History:        Patient has no prior history of Echocardiogram examinations.                 COPD, Signs/Symptoms:Hypotension and Dyspnea; Risk                 Factors:Former Smoker, Sleep Apnea and Dyslipidemia.  Sonographer:    Lucendia Herrlich Referring Phys: [id number] [facility]  Sonographer Comments: Image acquisition challenging due to respiratory motion. IMPRESSIONS  1. Left ventricular ejection fraction, by estimation, is 60 to 65%. The left ventricle has normal function. The left  ventricle has no regional wall motion abnormalities. Left ventricular diastolic parameters are consistent with Grade I diastolic dysfunction (impaired relaxation).  2. Right ventricular systolic function is normal. The right ventricular size is normal. Tricuspid regurgitation signal is inadequate for assessing PA pressure.  3. The mitral valve is normal in structure. No evidence of mitral valve regurgitation.  4. Aortic valve regurgitation is not visualized.  5. Pulmonic valve regurgitation not evaluated.  6. Aortic not evaluated.  7. The inferior vena cava is dilated in size with >50% respiratory variability, suggesting right atrial pressure of 8 mmHg.  8. Agitated saline contrast bubble study was negative, with no evidence of any interatrial shunt. Conclusion(s)/Recommendation(s): No intracardiac source of embolism detected on this transthoracic study. Consider a transesophageal echocardiogram to exclude cardiac source of embolism if clinically indicated. FINDINGS  Left Ventricle: Left ventricular ejection fraction, by estimation, is 60 to 65%. The left ventricle has normal function. The left ventricle has no regional wall motion abnormalities. The left ventricular internal cavity size was normal in size. There is  no left ventricular hypertrophy. Left ventricular diastolic parameters are consistent with Grade I diastolic dysfunction (impaired relaxation). Right Ventricle: The right ventricular size is normal. Right ventricular systolic function is normal. Tricuspid regurgitation signal is inadequate for assessing PA pressure. Left Atrium: Left atrial size was normal in size. Right Atrium: Right atrial size was normal in size. Pericardium: There is no evidence of pericardial effusion. Mitral Valve: The mitral valve is normal in structure. No evidence of mitral valve regurgitation. Tricuspid Valve: Tricuspid valve regurgitation is not demonstrated. Aortic Valve: Aortic valve regurgitation is not visualized. Aortic  valve peak gradient measures 9.1 mmHg. Pulmonic Valve: The pulmonic valve was not well visualized. Pulmonic valve  Physician Discharge Summary  IZABELA OW ZOX:[id number] DOB: 1966/02/24 DOA: 09/19/2023  PCP: Burton Apley, MD  Admit date: 09/19/2023 Discharge date: 09/24/2023  Time spent:45 minutes  Recommendations for Outpatient Follow-up:  PCP in 1 week Pulmonary Dr. Sherene Sires in 2 to 3 weeks   Discharge Diagnoses:  Principal Problem:   COPD exacerbation (HCC) Advanced COPD   Hypotension   Acute respiratory failure with hypoxia (HCC)   Normocytic anemia   Discharge Condition: Improving  Diet recommendation: Low-salt, heart healthy  Filed Weights   09/19/23 1654  Weight: 70.3 kg    History of present illness:  57 y.o. female past medical history of COPD, 40-year pack history quit about 4 years ago, relate that about 2 weeks ago started having nasal congestion and sinus drainage followed by productive cough and shortness of breath initially exertional but now at rest came into the ED found to be hypoxic placed on 3 L of oxygen.  CT angio of the chest negative for PE advance emphysema with right greater than left apical bullae   Hospital Course:   Acute hypoxic respiratory failure COPD exacerbation -Heavy smoker, 50 years, peak of 4 packs a day, quit few years ago -CTA chest noted advanced emphysema -= Positive for rhinovirus on admission -Treated with IV steroids, nebs, antitussives, flutter valve, incentive spirometry -Slowly improving, weaned off oxygen -Discharged home on prednisone taper -Multiple family members continue to smoke in the house, causing secondary exposure, counseled about this   Hypotension/essential hypertension: -Clonidine and lisinopril HCTZ were discontinued on admission -Blood pressure trended up on steroids -2D echo noted preserved EF, grade 1 diastolic dysfunction -Random a.m. cortisol was low however this was while on steroids and cosyntropin stim test showed appropriate response however all this is likely erroneous in the setting of ongoing  prednisone/steroid use in the hospital -Blood pressures in the 150s this morning, restarted on amlodipine, clonidine and lisinopril will be discontinued at discharge -Follow-up with PCP next week, monitor blood pressure as she tapers off steroids   Discharge Exam: Vitals:   09/24/23 0613 09/24/23 0838  BP:    Pulse:    Resp:    Temp:    SpO2: 98% 95%   Gen: Awake, Alert, Oriented X 3,  HEENT: no JVD Lungs: Good air movement bilaterally, rare exp wheezes CVS: S1S2/RRR Abd: soft, Non tender, non distended, BS present Extremities: No edema Skin: no new rashes on exposed skin   Discharge Instructions   Discharge Instructions     Diet - low sodium heart healthy   Complete by: As directed    Diet - low sodium heart healthy   Complete by: As directed    Increase activity slowly   Complete by: As directed    Increase activity slowly   Complete by: As directed       Allergies as of 09/24/2023       Reactions   Bee Venom Anaphylaxis   Penicillins Anaphylaxis   Dog Epithelium    Other Reaction(s): Other (See Comments) Reaction to Asthma throat closing   Protonix [pantoprazole Sodium] Diarrhea        Medication List     STOP taking these medications    cloNIDine 0.1 MG tablet Commonly known as: CATAPRES   lisinopril-hydrochlorothiazide 10-12.5 MG tablet Commonly known as: ZESTORETIC       TAKE these medications    albuterol 108 (90 Base) MCG/ACT inhaler Commonly known as: VENTOLIN HFA Inhale 2 puffs into the lungs every 4 (four) hours as  Physician Discharge Summary  IZABELA OW ZOX:[id number] DOB: 1966/02/24 DOA: 09/19/2023  PCP: Burton Apley, MD  Admit date: 09/19/2023 Discharge date: 09/24/2023  Time spent:45 minutes  Recommendations for Outpatient Follow-up:  PCP in 1 week Pulmonary Dr. Sherene Sires in 2 to 3 weeks   Discharge Diagnoses:  Principal Problem:   COPD exacerbation (HCC) Advanced COPD   Hypotension   Acute respiratory failure with hypoxia (HCC)   Normocytic anemia   Discharge Condition: Improving  Diet recommendation: Low-salt, heart healthy  Filed Weights   09/19/23 1654  Weight: 70.3 kg    History of present illness:  57 y.o. female past medical history of COPD, 40-year pack history quit about 4 years ago, relate that about 2 weeks ago started having nasal congestion and sinus drainage followed by productive cough and shortness of breath initially exertional but now at rest came into the ED found to be hypoxic placed on 3 L of oxygen.  CT angio of the chest negative for PE advance emphysema with right greater than left apical bullae   Hospital Course:   Acute hypoxic respiratory failure COPD exacerbation -Heavy smoker, 50 years, peak of 4 packs a day, quit few years ago -CTA chest noted advanced emphysema -= Positive for rhinovirus on admission -Treated with IV steroids, nebs, antitussives, flutter valve, incentive spirometry -Slowly improving, weaned off oxygen -Discharged home on prednisone taper -Multiple family members continue to smoke in the house, causing secondary exposure, counseled about this   Hypotension/essential hypertension: -Clonidine and lisinopril HCTZ were discontinued on admission -Blood pressure trended up on steroids -2D echo noted preserved EF, grade 1 diastolic dysfunction -Random a.m. cortisol was low however this was while on steroids and cosyntropin stim test showed appropriate response however all this is likely erroneous in the setting of ongoing  prednisone/steroid use in the hospital -Blood pressures in the 150s this morning, restarted on amlodipine, clonidine and lisinopril will be discontinued at discharge -Follow-up with PCP next week, monitor blood pressure as she tapers off steroids   Discharge Exam: Vitals:   09/24/23 0613 09/24/23 0838  BP:    Pulse:    Resp:    Temp:    SpO2: 98% 95%   Gen: Awake, Alert, Oriented X 3,  HEENT: no JVD Lungs: Good air movement bilaterally, rare exp wheezes CVS: S1S2/RRR Abd: soft, Non tender, non distended, BS present Extremities: No edema Skin: no new rashes on exposed skin   Discharge Instructions   Discharge Instructions     Diet - low sodium heart healthy   Complete by: As directed    Diet - low sodium heart healthy   Complete by: As directed    Increase activity slowly   Complete by: As directed    Increase activity slowly   Complete by: As directed       Allergies as of 09/24/2023       Reactions   Bee Venom Anaphylaxis   Penicillins Anaphylaxis   Dog Epithelium    Other Reaction(s): Other (See Comments) Reaction to Asthma throat closing   Protonix [pantoprazole Sodium] Diarrhea        Medication List     STOP taking these medications    cloNIDine 0.1 MG tablet Commonly known as: CATAPRES   lisinopril-hydrochlorothiazide 10-12.5 MG tablet Commonly known as: ZESTORETIC       TAKE these medications    albuterol 108 (90 Base) MCG/ACT inhaler Commonly known as: VENTOLIN HFA Inhale 2 puffs into the lungs every 4 (four) hours as  Physician Discharge Summary  IZABELA OW ZOX:[id number] DOB: 1966/02/24 DOA: 09/19/2023  PCP: Burton Apley, MD  Admit date: 09/19/2023 Discharge date: 09/24/2023  Time spent:45 minutes  Recommendations for Outpatient Follow-up:  PCP in 1 week Pulmonary Dr. Sherene Sires in 2 to 3 weeks   Discharge Diagnoses:  Principal Problem:   COPD exacerbation (HCC) Advanced COPD   Hypotension   Acute respiratory failure with hypoxia (HCC)   Normocytic anemia   Discharge Condition: Improving  Diet recommendation: Low-salt, heart healthy  Filed Weights   09/19/23 1654  Weight: 70.3 kg    History of present illness:  57 y.o. female past medical history of COPD, 40-year pack history quit about 4 years ago, relate that about 2 weeks ago started having nasal congestion and sinus drainage followed by productive cough and shortness of breath initially exertional but now at rest came into the ED found to be hypoxic placed on 3 L of oxygen.  CT angio of the chest negative for PE advance emphysema with right greater than left apical bullae   Hospital Course:   Acute hypoxic respiratory failure COPD exacerbation -Heavy smoker, 50 years, peak of 4 packs a day, quit few years ago -CTA chest noted advanced emphysema -= Positive for rhinovirus on admission -Treated with IV steroids, nebs, antitussives, flutter valve, incentive spirometry -Slowly improving, weaned off oxygen -Discharged home on prednisone taper -Multiple family members continue to smoke in the house, causing secondary exposure, counseled about this   Hypotension/essential hypertension: -Clonidine and lisinopril HCTZ were discontinued on admission -Blood pressure trended up on steroids -2D echo noted preserved EF, grade 1 diastolic dysfunction -Random a.m. cortisol was low however this was while on steroids and cosyntropin stim test showed appropriate response however all this is likely erroneous in the setting of ongoing  prednisone/steroid use in the hospital -Blood pressures in the 150s this morning, restarted on amlodipine, clonidine and lisinopril will be discontinued at discharge -Follow-up with PCP next week, monitor blood pressure as she tapers off steroids   Discharge Exam: Vitals:   09/24/23 0613 09/24/23 0838  BP:    Pulse:    Resp:    Temp:    SpO2: 98% 95%   Gen: Awake, Alert, Oriented X 3,  HEENT: no JVD Lungs: Good air movement bilaterally, rare exp wheezes CVS: S1S2/RRR Abd: soft, Non tender, non distended, BS present Extremities: No edema Skin: no new rashes on exposed skin   Discharge Instructions   Discharge Instructions     Diet - low sodium heart healthy   Complete by: As directed    Diet - low sodium heart healthy   Complete by: As directed    Increase activity slowly   Complete by: As directed    Increase activity slowly   Complete by: As directed       Allergies as of 09/24/2023       Reactions   Bee Venom Anaphylaxis   Penicillins Anaphylaxis   Dog Epithelium    Other Reaction(s): Other (See Comments) Reaction to Asthma throat closing   Protonix [pantoprazole Sodium] Diarrhea        Medication List     STOP taking these medications    cloNIDine 0.1 MG tablet Commonly known as: CATAPRES   lisinopril-hydrochlorothiazide 10-12.5 MG tablet Commonly known as: ZESTORETIC       TAKE these medications    albuterol 108 (90 Base) MCG/ACT inhaler Commonly known as: VENTOLIN HFA Inhale 2 puffs into the lungs every 4 (four) hours as  laboratory) are listed below for reference.    Significant Diagnostic Studies: ECHOCARDIOGRAM COMPLETE  Result Date: 09/20/2023    ECHOCARDIOGRAM REPORT   Patient Name:   Tracy Gray Date of Exam: 09/20/2023 Medical Rec #:  [id number]         Height:       64.0 in Accession #:    [id number]        Weight:       155.0 lb Date of Birth:  [birth year]         BSA:          1.755 m Patient Age:    57 years          BP:           108/72 mmHg Patient Gender: F                 HR:           92 bpm. Exam Location:  Inpatient Procedure: 2D Echo, Cardiac Doppler, Color Doppler and Saline Contrast Bubble            Study Indications:    Stroke I63.9  History:        Patient has no prior history of Echocardiogram examinations.                 COPD, Signs/Symptoms:Hypotension and Dyspnea; Risk                 Factors:Former Smoker, Sleep Apnea and Dyslipidemia.  Sonographer:    Lucendia Herrlich Referring Phys: [id number] [facility]  Sonographer Comments: Image acquisition challenging due to respiratory motion. IMPRESSIONS  1. Left ventricular ejection fraction, by estimation, is 60 to 65%. The left ventricle has normal function. The left  ventricle has no regional wall motion abnormalities. Left ventricular diastolic parameters are consistent with Grade I diastolic dysfunction (impaired relaxation).  2. Right ventricular systolic function is normal. The right ventricular size is normal. Tricuspid regurgitation signal is inadequate for assessing PA pressure.  3. The mitral valve is normal in structure. No evidence of mitral valve regurgitation.  4. Aortic valve regurgitation is not visualized.  5. Pulmonic valve regurgitation not evaluated.  6. Aortic not evaluated.  7. The inferior vena cava is dilated in size with >50% respiratory variability, suggesting right atrial pressure of 8 mmHg.  8. Agitated saline contrast bubble study was negative, with no evidence of any interatrial shunt. Conclusion(s)/Recommendation(s): No intracardiac source of embolism detected on this transthoracic study. Consider a transesophageal echocardiogram to exclude cardiac source of embolism if clinically indicated. FINDINGS  Left Ventricle: Left ventricular ejection fraction, by estimation, is 60 to 65%. The left ventricle has normal function. The left ventricle has no regional wall motion abnormalities. The left ventricular internal cavity size was normal in size. There is  no left ventricular hypertrophy. Left ventricular diastolic parameters are consistent with Grade I diastolic dysfunction (impaired relaxation). Right Ventricle: The right ventricular size is normal. Right ventricular systolic function is normal. Tricuspid regurgitation signal is inadequate for assessing PA pressure. Left Atrium: Left atrial size was normal in size. Right Atrium: Right atrial size was normal in size. Pericardium: There is no evidence of pericardial effusion. Mitral Valve: The mitral valve is normal in structure. No evidence of mitral valve regurgitation. Tricuspid Valve: Tricuspid valve regurgitation is not demonstrated. Aortic Valve: Aortic valve regurgitation is not visualized. Aortic  valve peak gradient measures 9.1 mmHg. Pulmonic Valve: The pulmonic valve was not well visualized. Pulmonic valve  laboratory) are listed below for reference.    Significant Diagnostic Studies: ECHOCARDIOGRAM COMPLETE  Result Date: 09/20/2023    ECHOCARDIOGRAM REPORT   Patient Name:   Tracy Gray Date of Exam: 09/20/2023 Medical Rec #:  [id number]         Height:       64.0 in Accession #:    [id number]        Weight:       155.0 lb Date of Birth:  [birth year]         BSA:          1.755 m Patient Age:    57 years          BP:           108/72 mmHg Patient Gender: F                 HR:           92 bpm. Exam Location:  Inpatient Procedure: 2D Echo, Cardiac Doppler, Color Doppler and Saline Contrast Bubble            Study Indications:    Stroke I63.9  History:        Patient has no prior history of Echocardiogram examinations.                 COPD, Signs/Symptoms:Hypotension and Dyspnea; Risk                 Factors:Former Smoker, Sleep Apnea and Dyslipidemia.  Sonographer:    Lucendia Herrlich Referring Phys: [id number] [facility]  Sonographer Comments: Image acquisition challenging due to respiratory motion. IMPRESSIONS  1. Left ventricular ejection fraction, by estimation, is 60 to 65%. The left ventricle has normal function. The left  ventricle has no regional wall motion abnormalities. Left ventricular diastolic parameters are consistent with Grade I diastolic dysfunction (impaired relaxation).  2. Right ventricular systolic function is normal. The right ventricular size is normal. Tricuspid regurgitation signal is inadequate for assessing PA pressure.  3. The mitral valve is normal in structure. No evidence of mitral valve regurgitation.  4. Aortic valve regurgitation is not visualized.  5. Pulmonic valve regurgitation not evaluated.  6. Aortic not evaluated.  7. The inferior vena cava is dilated in size with >50% respiratory variability, suggesting right atrial pressure of 8 mmHg.  8. Agitated saline contrast bubble study was negative, with no evidence of any interatrial shunt. Conclusion(s)/Recommendation(s): No intracardiac source of embolism detected on this transthoracic study. Consider a transesophageal echocardiogram to exclude cardiac source of embolism if clinically indicated. FINDINGS  Left Ventricle: Left ventricular ejection fraction, by estimation, is 60 to 65%. The left ventricle has normal function. The left ventricle has no regional wall motion abnormalities. The left ventricular internal cavity size was normal in size. There is  no left ventricular hypertrophy. Left ventricular diastolic parameters are consistent with Grade I diastolic dysfunction (impaired relaxation). Right Ventricle: The right ventricular size is normal. Right ventricular systolic function is normal. Tricuspid regurgitation signal is inadequate for assessing PA pressure. Left Atrium: Left atrial size was normal in size. Right Atrium: Right atrial size was normal in size. Pericardium: There is no evidence of pericardial effusion. Mitral Valve: The mitral valve is normal in structure. No evidence of mitral valve regurgitation. Tricuspid Valve: Tricuspid valve regurgitation is not demonstrated. Aortic Valve: Aortic valve regurgitation is not visualized. Aortic  valve peak gradient measures 9.1 mmHg. Pulmonic Valve: The pulmonic valve was not well visualized. Pulmonic valve

## 2023-09-25 DIAGNOSIS — J441 Chronic obstructive pulmonary disease with (acute) exacerbation: Secondary | ICD-10-CM | POA: Diagnosis not present

## 2023-09-25 NOTE — Discharge Summary (Signed)
Physician Discharge Summary  KALIE CABRAL UJW:[id number] DOB: 06/21/1966 DOA: 09/19/2023  PCP: Burton Apley, MD  Admit date: 09/19/2023 Discharge date: 09/25/2023  Admitted From: home Disposition:  home  Recommendations for Outpatient Follow-up:  Follow up with PCP in 1-2 weeks  Home Health: none Equipment/Devices: none  Discharge Condition: stable CODE STATUS: Full code  HPI: Per admitting MD, Tracy Gray is a 57 y.o. female with medical history significant of known COPD for which she has been diagnosed at least 10 years ago.  Patient quit tobacco use approximately 4 years ago.  Patient describes, in general her exercise capacity as being able to playfully run around her grandkids at home.  But certainly cannot indefinite distances or actually run in the yard behind them due to shortness of breath.  Patient reports that she was in her usual state of health till about 2 weeks ago, since then she has had nasal congestion, with copious drainage of sinus discharge posteriorly into the nasopharynx and marked cough with greenish expectoration.  Patient reports that she has previously had sinus surgeries and therefore she cannot blow her nose.  She has also had insidious onset of sensation of shortness of breath since then.  Which was initially primarily exertional but now presents somewhat even at rest.  Patient was pretty much incapacitated today and yesterday.  And stayed in bed, did not eat or drink much.  Patient initially went to an urgent care facility where she was noted to be markedly tachypneic just getting out of the car and beginning to walk.  Patient was found to be hypoxic on exertion and sent to [facility], ER.  Here, after discussion with ER attending it is corroborated that patient was having some interruption of her speech during prolonged sentences due to need to breathe.  And also was noted to have exertional hypoxemia.  There is no report of patient having any fevers  chest pain vomiting diarrhea palpitation leg swelling. ER workup is notable for a CAT scan pulmonary embolism protocol being done.  Medical evaluation is sought. Patient does not use supplementary oxygen at home, has currently been stabilized on 2 to 3 L/min of supplementary oxygen, currently 98% SpO2 on same.  Hospital Course / Discharge diagnoses: Principal Problem:   COPD exacerbation (HCC) Active Problems:   COPD  GOLD III with min reversiblity    Hypotension   Acute respiratory failure with hypoxia (HCC)   Normocytic anemia   Acute hypoxic respiratory failure COPD exacerbation -Heavy smoker, 50 years, peak of 4 packs a day, quit few years ago -CTA chest noted advanced emphysema -= Positive for rhinovirus on admission -Treated with IV steroids, nebs, antitussives, flutter valve, incentive spirometry -Slowly improving, weaned off oxygen -Discharged home on prednisone taper -Multiple family members continue to smoke in the house, causing secondary exposure, counseled about this   Hypotension/essential hypertension: -Clonidine and lisinopril HCTZ were discontinued on admission -Blood pressure trended up on steroids -2D echo noted preserved EF, grade 1 diastolic dysfunction -Random a.m. cortisol was low however this was while on steroids and cosyntropin stim test showed appropriate response however all this is likely erroneous in the setting of ongoing prednisone/steroid use in the hospital -Blood pressures in the 150s this morning, restarted on amlodipine, clonidine and lisinopril will be discontinued at discharge -Follow-up with PCP next week, monitor blood pressure as she tapers off steroids  Sepsis ruled out   Discharge Instructions  Discharge Instructions     Diet - low sodium  Physician Discharge Summary  KALIE CABRAL UJW:[id number] DOB: 06/21/1966 DOA: 09/19/2023  PCP: Burton Apley, MD  Admit date: 09/19/2023 Discharge date: 09/25/2023  Admitted From: home Disposition:  home  Recommendations for Outpatient Follow-up:  Follow up with PCP in 1-2 weeks  Home Health: none Equipment/Devices: none  Discharge Condition: stable CODE STATUS: Full code  HPI: Per admitting MD, Tracy Gray is a 57 y.o. female with medical history significant of known COPD for which she has been diagnosed at least 10 years ago.  Patient quit tobacco use approximately 4 years ago.  Patient describes, in general her exercise capacity as being able to playfully run around her grandkids at home.  But certainly cannot indefinite distances or actually run in the yard behind them due to shortness of breath.  Patient reports that she was in her usual state of health till about 2 weeks ago, since then she has had nasal congestion, with copious drainage of sinus discharge posteriorly into the nasopharynx and marked cough with greenish expectoration.  Patient reports that she has previously had sinus surgeries and therefore she cannot blow her nose.  She has also had insidious onset of sensation of shortness of breath since then.  Which was initially primarily exertional but now presents somewhat even at rest.  Patient was pretty much incapacitated today and yesterday.  And stayed in bed, did not eat or drink much.  Patient initially went to an urgent care facility where she was noted to be markedly tachypneic just getting out of the car and beginning to walk.  Patient was found to be hypoxic on exertion and sent to [facility], ER.  Here, after discussion with ER attending it is corroborated that patient was having some interruption of her speech during prolonged sentences due to need to breathe.  And also was noted to have exertional hypoxemia.  There is no report of patient having any fevers  chest pain vomiting diarrhea palpitation leg swelling. ER workup is notable for a CAT scan pulmonary embolism protocol being done.  Medical evaluation is sought. Patient does not use supplementary oxygen at home, has currently been stabilized on 2 to 3 L/min of supplementary oxygen, currently 98% SpO2 on same.  Hospital Course / Discharge diagnoses: Principal Problem:   COPD exacerbation (HCC) Active Problems:   COPD  GOLD III with min reversiblity    Hypotension   Acute respiratory failure with hypoxia (HCC)   Normocytic anemia   Acute hypoxic respiratory failure COPD exacerbation -Heavy smoker, 50 years, peak of 4 packs a day, quit few years ago -CTA chest noted advanced emphysema -= Positive for rhinovirus on admission -Treated with IV steroids, nebs, antitussives, flutter valve, incentive spirometry -Slowly improving, weaned off oxygen -Discharged home on prednisone taper -Multiple family members continue to smoke in the house, causing secondary exposure, counseled about this   Hypotension/essential hypertension: -Clonidine and lisinopril HCTZ were discontinued on admission -Blood pressure trended up on steroids -2D echo noted preserved EF, grade 1 diastolic dysfunction -Random a.m. cortisol was low however this was while on steroids and cosyntropin stim test showed appropriate response however all this is likely erroneous in the setting of ongoing prednisone/steroid use in the hospital -Blood pressures in the 150s this morning, restarted on amlodipine, clonidine and lisinopril will be discontinued at discharge -Follow-up with PCP next week, monitor blood pressure as she tapers off steroids  Sepsis ruled out   Discharge Instructions  Discharge Instructions     Diet - low sodium  2 puffs into the lungs daily.               Durable Medical Equipment  (From admission, onward)           Start     Ordered   09/24/23 1014  For home use only DME Nebulizer machine  Once       Question Answer Comment  Patient needs a nebulizer to treat with the following condition COPD (chronic obstructive pulmonary disease) (HCC)   Length of Need Lifetime      09/24/23 1013             Consultations: nne  Procedures/Studies:  ECHOCARDIOGRAM COMPLETE  Result Date: 09/20/2023    ECHOCARDIOGRAM REPORT   Patient Name:   TRIS HOWELL Date of Exam: 09/20/2023 Medical Rec #:  [id number]         Height:       64.0 in Accession #:    [id number]        Weight:       155.0 lb Date of Birth:  [birth year]         BSA:          1.755 m Patient Age:    57 years          BP:           108/72 mmHg Patient Gender: F                 HR:            92 bpm. Exam Location:  Inpatient Procedure: 2D Echo, Cardiac Doppler, Color Doppler and Saline Contrast Bubble            Study Indications:    Stroke I63.9  History:        Patient has no prior history of Echocardiogram examinations.                 COPD, Signs/Symptoms:Hypotension and Dyspnea; Risk                 Factors:Former Smoker, Sleep Apnea and Dyslipidemia.  Sonographer:    Lucendia Herrlich Referring Phys: [id number] [facility]  Sonographer Comments: Image acquisition challenging due to respiratory motion. IMPRESSIONS  1. Left ventricular ejection fraction, by estimation, is 60 to 65%. The left ventricle has normal function. The left ventricle has no regional wall motion abnormalities. Left ventricular diastolic parameters are consistent with Grade I diastolic dysfunction (impaired relaxation).  2. Right ventricular systolic function is normal. The right ventricular size is normal. Tricuspid regurgitation signal is inadequate for assessing PA pressure.  3. The mitral valve is normal in structure. No evidence of mitral valve regurgitation.  4. Aortic valve regurgitation is not visualized.  5. Pulmonic valve regurgitation not evaluated.  6. Aortic not evaluated.  7. The inferior vena cava is dilated in size with >50% respiratory variability, suggesting right atrial pressure of 8 mmHg.  8. Agitated saline contrast bubble study was negative, with no evidence of any interatrial shunt. Conclusion(s)/Recommendation(s): No intracardiac source of embolism detected on this transthoracic study. Consider a transesophageal echocardiogram to exclude cardiac source of embolism if clinically indicated. FINDINGS  Left Ventricle: Left ventricular ejection fraction, by estimation, is 60 to 65%. The left ventricle has normal function. The left ventricle has no regional wall motion abnormalities. The left ventricular internal cavity size was normal in size. There is  no left ventricular hypertrophy. Left ventricular diastolic  parameters are consistent with Grade I diastolic  2 puffs into the lungs daily.               Durable Medical Equipment  (From admission, onward)           Start     Ordered   09/24/23 1014  For home use only DME Nebulizer machine  Once       Question Answer Comment  Patient needs a nebulizer to treat with the following condition COPD (chronic obstructive pulmonary disease) (HCC)   Length of Need Lifetime      09/24/23 1013             Consultations: nne  Procedures/Studies:  ECHOCARDIOGRAM COMPLETE  Result Date: 09/20/2023    ECHOCARDIOGRAM REPORT   Patient Name:   TRIS HOWELL Date of Exam: 09/20/2023 Medical Rec #:  [id number]         Height:       64.0 in Accession #:    [id number]        Weight:       155.0 lb Date of Birth:  [birth year]         BSA:          1.755 m Patient Age:    57 years          BP:           108/72 mmHg Patient Gender: F                 HR:            92 bpm. Exam Location:  Inpatient Procedure: 2D Echo, Cardiac Doppler, Color Doppler and Saline Contrast Bubble            Study Indications:    Stroke I63.9  History:        Patient has no prior history of Echocardiogram examinations.                 COPD, Signs/Symptoms:Hypotension and Dyspnea; Risk                 Factors:Former Smoker, Sleep Apnea and Dyslipidemia.  Sonographer:    Lucendia Herrlich Referring Phys: [id number] [facility] GOEL  Sonographer Comments: Image acquisition challenging due to respiratory motion. IMPRESSIONS  1. Left ventricular ejection fraction, by estimation, is 60 to 65%. The left ventricle has normal function. The left ventricle has no regional wall motion abnormalities. Left ventricular diastolic parameters are consistent with Grade I diastolic dysfunction (impaired relaxation).  2. Right ventricular systolic function is normal. The right ventricular size is normal. Tricuspid regurgitation signal is inadequate for assessing PA pressure.  3. The mitral valve is normal in structure. No evidence of mitral valve regurgitation.  4. Aortic valve regurgitation is not visualized.  5. Pulmonic valve regurgitation not evaluated.  6. Aortic not evaluated.  7. The inferior vena cava is dilated in size with >50% respiratory variability, suggesting right atrial pressure of 8 mmHg.  8. Agitated saline contrast bubble study was negative, with no evidence of any interatrial shunt. Conclusion(s)/Recommendation(s): No intracardiac source of embolism detected on this transthoracic study. Consider a transesophageal echocardiogram to exclude cardiac source of embolism if clinically indicated. FINDINGS  Left Ventricle: Left ventricular ejection fraction, by estimation, is 60 to 65%. The left ventricle has normal function. The left ventricle has no regional wall motion abnormalities. The left ventricular internal cavity size was normal in size. There is  no left ventricular hypertrophy. Left ventricular diastolic  parameters are consistent with Grade I diastolic  2 puffs into the lungs daily.               Durable Medical Equipment  (From admission, onward)           Start     Ordered   09/24/23 1014  For home use only DME Nebulizer machine  Once       Question Answer Comment  Patient needs a nebulizer to treat with the following condition COPD (chronic obstructive pulmonary disease) (HCC)   Length of Need Lifetime      09/24/23 1013             Consultations: nne  Procedures/Studies:  ECHOCARDIOGRAM COMPLETE  Result Date: 09/20/2023    ECHOCARDIOGRAM REPORT   Patient Name:   TRIS HOWELL Date of Exam: 09/20/2023 Medical Rec #:  [id number]         Height:       64.0 in Accession #:    [id number]        Weight:       155.0 lb Date of Birth:  [birth year]         BSA:          1.755 m Patient Age:    57 years          BP:           108/72 mmHg Patient Gender: F                 HR:            92 bpm. Exam Location:  Inpatient Procedure: 2D Echo, Cardiac Doppler, Color Doppler and Saline Contrast Bubble            Study Indications:    Stroke I63.9  History:        Patient has no prior history of Echocardiogram examinations.                 COPD, Signs/Symptoms:Hypotension and Dyspnea; Risk                 Factors:Former Smoker, Sleep Apnea and Dyslipidemia.  Sonographer:    Lucendia Herrlich Referring Phys: [id number] [facility] GO[facility]  Sonographer Comments: Image acquisition challenging due to respiratory motion. IMPRESSIONS  1. Left ventricular ejection fraction, by estimation, is 60 to 65%. The left ventricle has normal function. The left ventricle has no regional wall motion abnormalities. Left ventricular diastolic parameters are consistent with Grade I diastolic dysfunction (impaired relaxation).  2. Right ventricular systolic function is normal. The right ventricular size is normal. Tricuspid regurgitation signal is inadequate for assessing PA pressure.  3. The mitral valve is normal in structure. No evidence of mitral valve regurgitation.  4. Aortic valve regurgitation is not visualized.  5. Pulmonic valve regurgitation not evaluated.  6. Aortic not evaluated.  7. The inferior vena cava is dilated in size with >50% respiratory variability, suggesting right atrial pressure of 8 mmHg.  8. Agitated saline contrast bubble study was negative, with no evidence of any interatrial shunt. Conclusion(s)/Recommendation(s): No intracardiac source of embolism detected on this transthoracic study. Consider a transesophageal echocardiogram to exclude cardiac source of embolism if clinically indicated. FINDINGS  Left Ventricle: Left ventricular ejection fraction, by estimation, is 60 to 65%. The left ventricle has normal function. The left ventricle has no regional wall motion abnormalities. The left ventricular internal cavity size was normal in size. There is  no left ventricular hypertrophy. Left ventricular diastolic  parameters are consistent with Grade I diastolic  Physician Discharge Summary  KALIE CABRAL UJW:[id number] DOB: 06/21/1966 DOA: 09/19/2023  PCP: Burton Apley, MD  Admit date: 09/19/2023 Discharge date: 09/25/2023  Admitted From: home Disposition:  home  Recommendations for Outpatient Follow-up:  Follow up with PCP in 1-2 weeks  Home Health: none Equipment/Devices: none  Discharge Condition: stable CODE STATUS: Full code  HPI: Per admitting MD, Tracy Gray is a 57 y.o. female with medical history significant of known COPD for which she has been diagnosed at least 10 years ago.  Patient quit tobacco use approximately 4 years ago.  Patient describes, in general her exercise capacity as being able to playfully run around her grandkids at home.  But certainly cannot indefinite distances or actually run in the yard behind them due to shortness of breath.  Patient reports that she was in her usual state of health till about 2 weeks ago, since then she has had nasal congestion, with copious drainage of sinus discharge posteriorly into the nasopharynx and marked cough with greenish expectoration.  Patient reports that she has previously had sinus surgeries and therefore she cannot blow her nose.  She has also had insidious onset of sensation of shortness of breath since then.  Which was initially primarily exertional but now presents somewhat even at rest.  Patient was pretty much incapacitated today and yesterday.  And stayed in bed, did not eat or drink much.  Patient initially went to an urgent care facility where she was noted to be markedly tachypneic just getting out of the car and beginning to walk.  Patient was found to be hypoxic on exertion and sent to [facility], ER.  Here, after discussion with ER attending it is corroborated that patient was having some interruption of her speech during prolonged sentences due to need to breathe.  And also was noted to have exertional hypoxemia.  There is no report of patient having any fevers  chest pain vomiting diarrhea palpitation leg swelling. ER workup is notable for a CAT scan pulmonary embolism protocol being done.  Medical evaluation is sought. Patient does not use supplementary oxygen at home, has currently been stabilized on 2 to 3 L/min of supplementary oxygen, currently 98% SpO2 on same.  Hospital Course / Discharge diagnoses: Principal Problem:   COPD exacerbation (HCC) Active Problems:   COPD  GOLD III with min reversiblity    Hypotension   Acute respiratory failure with hypoxia (HCC)   Normocytic anemia   Acute hypoxic respiratory failure COPD exacerbation -Heavy smoker, 50 years, peak of 4 packs a day, quit few years ago -CTA chest noted advanced emphysema -= Positive for rhinovirus on admission -Treated with IV steroids, nebs, antitussives, flutter valve, incentive spirometry -Slowly improving, weaned off oxygen -Discharged home on prednisone taper -Multiple family members continue to smoke in the house, causing secondary exposure, counseled about this   Hypotension/essential hypertension: -Clonidine and lisinopril HCTZ were discontinued on admission -Blood pressure trended up on steroids -2D echo noted preserved EF, grade 1 diastolic dysfunction -Random a.m. cortisol was low however this was while on steroids and cosyntropin stim test showed appropriate response however all this is likely erroneous in the setting of ongoing prednisone/steroid use in the hospital -Blood pressures in the 150s this morning, restarted on amlodipine, clonidine and lisinopril will be discontinued at discharge -Follow-up with PCP next week, monitor blood pressure as she tapers off steroids  Sepsis ruled out   Discharge Instructions  Discharge Instructions     Diet - low sodium  Physician Discharge Summary  KALIE CABRAL UJW:[id number] DOB: 06/21/1966 DOA: 09/19/2023  PCP: Burton Apley, MD  Admit date: 09/19/2023 Discharge date: 09/25/2023  Admitted From: home Disposition:  home  Recommendations for Outpatient Follow-up:  Follow up with PCP in 1-2 weeks  Home Health: none Equipment/Devices: none  Discharge Condition: stable CODE STATUS: Full code  HPI: Per admitting MD, Tracy Gray is a 57 y.o. female with medical history significant of known COPD for which she has been diagnosed at least 10 years ago.  Patient quit tobacco use approximately 4 years ago.  Patient describes, in general her exercise capacity as being able to playfully run around her grandkids at home.  But certainly cannot indefinite distances or actually run in the yard behind them due to shortness of breath.  Patient reports that she was in her usual state of health till about 2 weeks ago, since then she has had nasal congestion, with copious drainage of sinus discharge posteriorly into the nasopharynx and marked cough with greenish expectoration.  Patient reports that she has previously had sinus surgeries and therefore she cannot blow her nose.  She has also had insidious onset of sensation of shortness of breath since then.  Which was initially primarily exertional but now presents somewhat even at rest.  Patient was pretty much incapacitated today and yesterday.  And stayed in bed, did not eat or drink much.  Patient initially went to an urgent care facility where she was noted to be markedly tachypneic just getting out of the car and beginning to walk.  Patient was found to be hypoxic on exertion and sent to [facility], ER.  Here, after discussion with ER attending it is corroborated that patient was having some interruption of her speech during prolonged sentences due to need to breathe.  And also was noted to have exertional hypoxemia.  There is no report of patient having any fevers  chest pain vomiting diarrhea palpitation leg swelling. ER workup is notable for a CAT scan pulmonary embolism protocol being done.  Medical evaluation is sought. Patient does not use supplementary oxygen at home, has currently been stabilized on 2 to 3 L/min of supplementary oxygen, currently 98% SpO2 on same.  Hospital Course / Discharge diagnoses: Principal Problem:   COPD exacerbation (HCC) Active Problems:   COPD  GOLD III with min reversiblity    Hypotension   Acute respiratory failure with hypoxia (HCC)   Normocytic anemia   Acute hypoxic respiratory failure COPD exacerbation -Heavy smoker, 50 years, peak of 4 packs a day, quit few years ago -CTA chest noted advanced emphysema -= Positive for rhinovirus on admission -Treated with IV steroids, nebs, antitussives, flutter valve, incentive spirometry -Slowly improving, weaned off oxygen -Discharged home on prednisone taper -Multiple family members continue to smoke in the house, causing secondary exposure, counseled about this   Hypotension/essential hypertension: -Clonidine and lisinopril HCTZ were discontinued on admission -Blood pressure trended up on steroids -2D echo noted preserved EF, grade 1 diastolic dysfunction -Random a.m. cortisol was low however this was while on steroids and cosyntropin stim test showed appropriate response however all this is likely erroneous in the setting of ongoing prednisone/steroid use in the hospital -Blood pressures in the 150s this morning, restarted on amlodipine, clonidine and lisinopril will be discontinued at discharge -Follow-up with PCP next week, monitor blood pressure as she tapers off steroids  Sepsis ruled out   Discharge Instructions  Discharge Instructions     Diet - low sodium  2 puffs into the lungs daily.               Durable Medical Equipment  (From admission, onward)           Start     Ordered   09/24/23 1014  For home use only DME Nebulizer machine  Once       Question Answer Comment  Patient needs a nebulizer to treat with the following condition COPD (chronic obstructive pulmonary disease) (HCC)   Length of Need Lifetime      09/24/23 1013             Consultations: nne  Procedures/Studies:  ECHOCARDIOGRAM COMPLETE  Result Date: 09/20/2023    ECHOCARDIOGRAM REPORT   Patient Name:   TRIS HOWELL Date of Exam: 09/20/2023 Medical Rec #:  [id number]         Height:       64.0 in Accession #:    [id number]        Weight:       155.0 lb Date of Birth:  [birth year]         BSA:          1.755 m Patient Age:    57 years          BP:           108/72 mmHg Patient Gender: F                 HR:            92 bpm. Exam Location:  Inpatient Procedure: 2D Echo, Cardiac Doppler, Color Doppler and Saline Contrast Bubble            Study Indications:    Stroke I63.9  History:        Patient has no prior history of Echocardiogram examinations.                 COPD, Signs/Symptoms:Hypotension and Dyspnea; Risk                 Factors:Former Smoker, Sleep Apnea and Dyslipidemia.  Sonographer:    Lucendia Herrlich Referring Phys: [id number] [facility] GOEL  Sonographer Comments: Image acquisition challenging due to respiratory motion. IMPRESSIONS  1. Left ventricular ejection fraction, by estimation, is 60 to 65%. The left ventricle has normal function. The left ventricle has no regional wall motion abnormalities. Left ventricular diastolic parameters are consistent with Grade I diastolic dysfunction (impaired relaxation).  2. Right ventricular systolic function is normal. The right ventricular size is normal. Tricuspid regurgitation signal is inadequate for assessing PA pressure.  3. The mitral valve is normal in structure. No evidence of mitral valve regurgitation.  4. Aortic valve regurgitation is not visualized.  5. Pulmonic valve regurgitation not evaluated.  6. Aortic not evaluated.  7. The inferior vena cava is dilated in size with >50% respiratory variability, suggesting right atrial pressure of 8 mmHg.  8. Agitated saline contrast bubble study was negative, with no evidence of any interatrial shunt. Conclusion(s)/Recommendation(s): No intracardiac source of embolism detected on this transthoracic study. Consider a transesophageal echocardiogram to exclude cardiac source of embolism if clinically indicated. FINDINGS  Left Ventricle: Left ventricular ejection fraction, by estimation, is 60 to 65%. The left ventricle has normal function. The left ventricle has no regional wall motion abnormalities. The left ventricular internal cavity size was normal in size. There is  no left ventricular hypertrophy. Left ventricular diastolic  parameters are consistent with Grade I diastolic

## 2023-09-25 NOTE — Care Management Important Message (Signed)
Important Message  Patient Details IM Letter given to the Patient. Name: Tracy Gray MRN: [id number] Date of Birth: Dec 23, 1966   Important Message Given:  Yes - Medicare IM     Caren Macadam 09/25/2023, 11:52 AM

## 2023-09-25 NOTE — Progress Notes (Deleted)
Patient seen and examined this morning. She is feeling better. She has no wheezing and is stable for discharge home. Please refer to DC summary by Dr Jomarie Longs 09/24/2023.  Tracy Eve M. Elvera Lennox, MD, PhD [facility]  Between 7 am - 7 pm you can contact me via Amion (for emergencies) or Securechat (non urgent matters).  I am not available 7 pm - 7 am, please contact night coverage MD/APP via Amion

## 2023-09-25 NOTE — TOC Transition Note (Signed)
Transition of Care [facility]) - CM/SW Discharge Note   Patient Details  Name: Tracy Gray MRN: [id number] Date of Birth: [birth year]  [facility]) CM/SW Contact:  Harriett Sine, RN Phone Number: 09/25/2023, 3:46 PM   Clinical Narrative:    Pt from home, d/c home with self care. DME delivered to pt room before d/c. Transportation arranged by pt. Taxi voucher declined   Final next level of care: Home/Self Care Barriers to Discharge: Barriers Resolved   Patient Goals and CMS Choice CMS Medicare.gov Compare Post Acute Care list provided to::  (none) Choice offered to / list presented to : NA  Discharge Placement                    Name of family member notified: arranged by pt Patient and family notified of of transfer: 09/25/23  Discharge Plan and Services Additional resources added to the After Visit Summary for                  DME Arranged: Nebulizer machine DME Agency: [employer] Date DME Agency Contacted: 09/25/23 Time DME Agency Contacted: 1030 Representative spoke with at DME Agency: Vaughan Basta            Social Determinants of Health (SDOH) Interventions SDOH Screenings   Food Insecurity: No Food Insecurity (09/19/2023)  Housing: Low Risk  (09/19/2023)  Transportation Needs: No Transportation Needs (09/19/2023)  Utilities: Not At Risk (09/19/2023)  Tobacco Use: Medium Risk (09/19/2023)     Readmission Risk Interventions     No data to display

## 2023-10-01 ENCOUNTER — Telehealth: Payer: Self-pay | Admitting: Acute Care

## 2023-10-01 NOTE — Telephone Encounter (Signed)
PT was just released from hospital. Should she still go in for the CT scan sched soon? Her # is [phone]

## 2023-10-02 NOTE — Telephone Encounter (Signed)
Spoke with patient by phone.  Reports she was released from hospital 10/01/23 for 'pneumonia.'  Will reschedule LDCT in about a month.  Patient was hospitalized 7 days.  Will contact patient to reschedule in mid November to make sure she is feeling well.  Had CTA during hospital admission.

## 2023-10-07 ENCOUNTER — Ambulatory Visit (INDEPENDENT_AMBULATORY_CARE_PROVIDER_SITE_OTHER): Payer: 59 | Admitting: Acute Care

## 2023-10-07 DIAGNOSIS — Z87891 Personal history of nicotine dependence: Secondary | ICD-10-CM

## 2023-10-07 NOTE — Patient Instructions (Signed)

## 2023-10-07 NOTE — Progress Notes (Addendum)
 Provider Attestation I agree with the documentation of the Shared Decision Making visit,  smoking cessation counseling if appropriate, and verification or eligibility for lung cancer screening as documented by the RN Nurse Navigator.   Lauraine PHEBE Lites, MSN, AGACNP-BC [place] [facility] See Amion for personal pager PCCM on call pager [phone]     Virtual Visit via Telephone Note  I connected with Tracy Gray on 10/07/23 at  3:00 PM EDT by telephone and verified that I am speaking with the correct person using two identifiers.  Location: Patient: Tracy Gray  Provider: Laneta Speaks, RN   I discussed the limitations, risks, security and privacy concerns of performing an evaluation and management service by telephone and the availability of in person appointments. I also discussed with the patient that there may be a patient responsible charge related to this service. The patient expressed understanding and agreed to proceed.     Shared Decision Making Visit Lung Cancer Screening Program [id number])   Eligibility: Age 57 y.o. Pack Years Smoking History Calculation 65 (# packs/per year x # years smoked) Recent History of coughing up blood  no Unexplained weight loss? no ( >Than 15 pounds within the last 6 months ) Prior History Lung / other cancer no (Diagnosis within the last 5 years already requiring surveillance chest CT Scans). Smoking Status Former Smoker Former Smokers: Years since quit: 4 years  Quit Date: 2020  Visit Components: Discussion included one or more decision making aids. yes Discussion included risk/benefits of screening. yes Discussion included potential follow up diagnostic testing for abnormal scans. yes Discussion included meaning and risk of over diagnosis. yes Discussion included meaning and risk of False Positives. yes Discussion included meaning of total radiation exposure. yes  Counseling Included: Importance  of adherence to annual lung cancer LDCT screening. yes Impact of comorbidities on ability to participate in the program. yes Ability and willingness to under diagnostic treatment. yes  Smoking Cessation Counseling:  Former Smokers:  Discussed the importance of maintaining cigarette abstinence. yes Diagnosis Code: Personal History of Nicotine Dependence. S12.108 Information about tobacco cessation classes and interventions provided to patient. Yes Written Order for Lung Cancer Screening with LDCT placed in Epic. Yes (CT Chest Lung Cancer Screening Low Dose W/O CM) [id number] Z12.2-Screening of respiratory organs Z87.891-Personal history of nicotine dependence  Pt recently hospitalized for pneumonia and covid and is still recovering.  She is aware that we will be calling her to reschedule CT sometime in mid November.     Ember Gottwald, RN

## 2023-10-08 ENCOUNTER — Other Ambulatory Visit: Payer: 59

## 2023-11-10 ENCOUNTER — Ambulatory Visit (INDEPENDENT_AMBULATORY_CARE_PROVIDER_SITE_OTHER): Payer: 59 | Admitting: Primary Care

## 2023-11-10 ENCOUNTER — Encounter: Payer: Self-pay | Admitting: Primary Care

## 2023-11-10 VITALS — BP 92/60 | HR 91 | Temp 98.1°F | Ht 64.0 in | Wt 151.8 lb

## 2023-11-10 DIAGNOSIS — Z87891 Personal history of nicotine dependence: Secondary | ICD-10-CM | POA: Diagnosis not present

## 2023-11-10 DIAGNOSIS — J449 Chronic obstructive pulmonary disease, unspecified: Secondary | ICD-10-CM | POA: Diagnosis not present

## 2023-11-10 DIAGNOSIS — Z23 Encounter for immunization: Secondary | ICD-10-CM | POA: Diagnosis not present

## 2023-11-10 MED ORDER — BREZTRI AEROSPHERE 160-9-4.8 MCG/ACT IN AERO: 2.0000 | INHALATION_SPRAY | Freq: Two times a day (BID) | RESPIRATORY_TRACT | Status: DC

## 2023-11-10 NOTE — Patient Instructions (Addendum)
Tracy Gray can absolutely NOT be exposed to cigarette smoke in the house. Any family member must smoke outside the house, this is extremely important due to her underlying severe COPD and severe emphysema  COPD recommendations:  Stop [employer] two puffs morning and evening Continue Albuterol nebulizer every 6 hours as needed for breakthrough shortness of breath/wheezing  Use Incentive spirometer 5-10 deep breaths three to four times a day  Take mucinex twice daily as needed and use flutter valve to loosen chest congestion  Stay away from cigarette smoke Stay as active as possible Stay up to date with vaccines  Vaccines:  RSV vaccine recommended at age 52 Received annual flu vaccine today  Recommend you get covid vaccine and pneumonia vaccine (Prevnar) with pharmacy   Follow-up: 3-4 months with Waynetta Sandy NP or Dr. Dionicio Stall

## 2023-11-10 NOTE — Progress Notes (Signed)
@Patient  ID: Tracy Gray, female    DOB: 12-04-1966, 57 y.o.   MRN: [id number]  Chief Complaint  Patient presents with   Follow-up    Referring provider: Burton Apley, MD  HPI: 57 year old female, former smoker.  Past medical history significant for COPD Gold 3, OSA, acute respiratory failure with hypoxia, former smoker.  11/10/2023 Discussed the use of AI scribe software for clinical note transcription with the patient, who gave verbal consent to proceed.  History of Present Illness   Patient of Dr. Sherene Sires, last seen May 2023  The patient, with a history of advanced emphysema and COPD, presents for a follow-up visit after a recent hospitalization due to a COPD flare-up, treated with steroids, BD and flutter valve. CTA in September 2024 was negative for pulmonary embolism, advanced emphysema with large right greater than left apical bulla and hyperexpansion of right upper lobe.  No acute airspace disease. She reports improvement since being dischaged. She quit smoking 5 years ago but continues to be exposed to second-hand smoke at home.The patient has been using Stiolto for symptom management but reports difficulty in breathing, particularly during physical activities such as grocery shopping and doing laundry. She also mentions frequent use of Albuterol inhaler due to breathlessness. The patient has experienced adverse effects, including a sore throat and voice changes from using a dry powder inhaler (Abreva) which she had to discontinue. The patient has a history of using steroid-based inhalers and believes she experienced better symptom control when on these medications. She is currently not on any steroid medication.   The patient's respiratory symptoms have been further complicated by multiple infections, including COVID-19 (three times), RSV (twice), and rhinovirus. She reports persistent discomfort in the right lung, particularly when exposed to cold air.   Allergies  Allergen  Reactions   Bee Venom Anaphylaxis   Penicillins Anaphylaxis   Dog Epithelium     Other Reaction(s): Other (See Comments)  Reaction to Asthma throat closing   Protonix [Pantoprazole Sodium] Diarrhea    Immunization History  Administered Date(s) Administered   Fluad Quad(high Dose 65+) 10/06/2020   Influenza Inj Mdck Quad Pf 10/07/2019   Influenza Split 09/23/2015, 09/22/2017   Influenza-Unspecified 05/19/2017, 09/02/2021   Moderna Sars-Covid-2 Vaccination 04/21/2020, 05/19/2020, 12/28/2020   Pneumococcal-Unspecified 09/23/2015   Tdap 04/25/2013    Past Medical History:  Diagnosis Date   Adenomatous colon polyp 03/26/2018   3 adenomas, max 10mm   Allergy    Anxiety    Asthma    Chronic headache    COPD (chronic obstructive pulmonary disease) (HCC)    Degenerative disc disease    Fibromyalgia    GERD (gastroesophageal reflux disease)    Hyperlipidemia    Hypertension    Multiple sclerosis (HCC)    Nerve damage    OSA (obstructive sleep apnea)    not on CPAP    Sleep apnea     Tobacco History: Social History   Tobacco Use  Smoking Status Former   Current packs/day: 0.00   Average packs/day: 1.5 packs/day for 45.4 years (68.2 ttl pk-yrs)   Types: Cigarettes   Start date: 23   Quit date: 06/03/2018   Years since quitting: 5.4  Smokeless Tobacco Never  Tobacco Comments       Counseling given: Not Answered Tobacco comments:     Outpatient Medications Prior to Visit  Medication Sig Dispense Refill   albuterol (ACCUNEB) 0.63 MG/3ML nebulizer solution Take 1 ampule by nebulization 2 (two) times daily.  albuterol (PROVENTIL) (2.5 MG/3ML) 0.083% nebulizer solution Take 2.5 mg by nebulization every 4 (four) hours as needed for wheezing or shortness of breath. PLAN C     albuterol (VENTOLIN HFA) 108 (90 Base) MCG/ACT inhaler Inhale 2 puffs into the lungs every 4 (four) hours as needed for wheezing or shortness of breath.     amLODipine (NORVASC) 10 MG tablet  Take 10 mg by mouth daily.     Artificial Tear Ointment (DRY EYES OP) Place 1 drop into both eyes daily as needed (dry eyes).     clonazePAM (KLONOPIN) 1 MG tablet Take 2 mg by mouth at bedtime.     EPINEPHrine 0.3 mg/0.3 mL IJ SOAJ injection Inject 0.3 mg into the muscle as needed for anaphylaxis.     famotidine (PEPCID) 20 MG tablet TAKE 2 TABLETS BY MOUTH DAILY AT BEDTIME 180 tablet 0   fluticasone (FLONASE) 50 MCG/ACT nasal spray Place 1 spray into both nostrils 3 (three) times daily as needed for allergies.     HYDROcodone-acetaminophen (NORCO) 7.5-325 MG tablet Take 1 tablet by mouth in the morning, at noon, and at bedtime.     ibuprofen (ADVIL) 200 MG tablet Take 200 mg by mouth every 6 (six) hours as needed.     montelukast (SINGULAIR) 10 MG tablet Take 10 mg by mouth at bedtime.     predniSONE (DELTASONE) 20 MG tablet Take 40 Mg daily for 3 days then 20 Mg daily for 3 days then 10 Mg daily for 3 days then stop 20 tablet 0   QUININE SULFATE PO Take 300 mg by mouth daily. Ordered from [place]     Respiratory Therapy Supplies (FLUTTER) DEVI Use as directed 1 each 0   rosuvastatin (CRESTOR) 20 MG tablet Take 20 mg by mouth daily.     sodium chloride (OCEAN) 0.65 % nasal spray Place 1 spray into both nostrils as needed (to keep moisture in nose).     Tiotropium Bromide-Olodaterol (STIOLTO RESPIMAT) 2.5-2.5 MCG/ACT AERS Inhale 2 puffs into the lungs daily. 4 g 5   ipratropium-albuterol (DUONEB) 0.5-2.5 (3) MG/3ML SOLN Take 3 mLs by nebulization every 4 (four) hours as needed for up to 20 days. 360 mL 0   No facility-administered medications prior to visit.   Review of Systems  Review of Systems  Constitutional: Negative.   HENT:  Positive for congestion.   Respiratory:  Positive for cough, shortness of breath and wheezing.   Cardiovascular: Negative.    Physical Exam  BP 92/60 (BP Location: Left Arm, Patient Position: Sitting, Cuff Size: Normal)   Pulse 91   Temp 98.1 F (36.7 C)  (Oral)   Ht 5\' 4"  (1.626 m)   Wt 151 lb 12.8 oz (68.9 kg)   SpO2 97%   BMI 26.06 kg/m  Physical Exam Constitutional:      General: She is not in acute distress.    Appearance: Normal appearance. She is not ill-appearing.  HENT:     Head: Normocephalic and atraumatic.  Cardiovascular:     Rate and Rhythm: Normal rate.  Pulmonary:     Effort: Pulmonary effort is normal. No respiratory distress.     Breath sounds: No stridor. Wheezing present.  Musculoskeletal:        General: Normal range of motion.  Skin:    General: Skin is warm and dry.  Neurological:     General: No focal deficit present.     Mental Status: She is alert and oriented to person, place, and  time. Mental status is at baseline.  Psychiatric:        Mood and Affect: Mood normal.        Behavior: Behavior normal.        Thought Content: Thought content normal.        Judgment: Judgment normal.      Lab Results:  CBC    Component Value Date/Time   WBC 6.4 09/20/2023 0446   RBC 3.64 (L) 09/20/2023 0446   HGB 11.4 (L) 09/20/2023 0446   HCT 35.2 (L) 09/20/2023 0446   PLT 247 09/20/2023 0446   MCV 96.7 09/20/2023 0446   MCH 31.3 09/20/2023 0446   MCHC 32.4 09/20/2023 0446   RDW 14.1 09/20/2023 0446   LYMPHSABS 0.5 (L) 09/19/2023 1752   MONOABS 1.0 09/19/2023 1752   EOSABS 0.0 09/19/2023 1752   BASOSABS 0.0 09/19/2023 1752    BMET    Component Value Date/Time   NA 140 09/20/2023 0446   K 4.1 09/20/2023 0446   CL 105 09/20/2023 0446   CO2 25 09/20/2023 0446   GLUCOSE 160 (H) 09/20/2023 0446   BUN 8 09/20/2023 0446   CREATININE 0.81 09/20/2023 0446   CALCIUM 8.6 (L) 09/20/2023 0446   GFRNONAA >60 09/20/2023 0446   GFRAA >60 12/18/2015 1733    BNP    Component Value Date/Time   BNP 151.2 (H) 09/19/2023 1752    ProBNP    Component Value Date/Time   PROBNP 45.0 03/01/2016 1103    Imaging: No results found.   Assessment & Plan:   1. COPD  GOLD III with min reversiblity   2. Former  cigarette  smoker  3. Immunization due - Flu vaccine trivalent PF, 6mos and older(Flulaval,Afluria,Fluarix,Fluzone)  Severe COPD and Emphysema Recent hospitalization for COPD exacerbation. Patient quit smoking 5 years ago but is exposed to secondhand smoke at home. Currently on Stiolto and Albuterol but reports frequent use of Albuterol due to shortness of breath. Intolerance to dry powder inhalers. -Discontinue Ipratropium-Albuterol nebulizer as it is redundant with Stiolto. -Start Breztri Aerosphere two puffs twice daily  -Continue use of incentive spirometer 3-4 times a day  -Take mucinex and use flutter valve as needed to loosen chest congestion -Advise family members to smoke outside the house to reduce patient's exposure to secondhand smoke.  General Health Maintenance -Administer influenza vaccine today. -Recommend RSV vaccine at age 70. -Check if due for pneumonia vaccine (last received in 2016). -Recommend COVID-19 vaccine at pharmacy. -Continue annual lung cancer screenings due to history of smoking and severe emphysema.     Follow-up: 3-4 months with [facility] NP or Dr. York Ram, NP 11/10/2023

## 2024-01-02 ENCOUNTER — Telehealth: Payer: Self-pay | Admitting: Primary Care

## 2024-01-02 NOTE — Telephone Encounter (Signed)
 AVS States:Start Breztri Aerosphere two puffs morning and evening   PT states no Pharm ever called but I see a call in on 11/18. Please check and call PT.   [facility] on [address]

## 2024-01-03 MED ORDER — BREZTRI AEROSPHERE 160-9-4.8 MCG/ACT IN AERO: 2.0000 | INHALATION_SPRAY | Freq: Two times a day (BID) | RESPIRATORY_TRACT | 6 refills | Status: DC

## 2024-01-03 NOTE — Telephone Encounter (Signed)
 Rx for [facility] sent to pharmacy for pt.

## 2024-01-19 ENCOUNTER — Ambulatory Visit: Payer: 59 | Admitting: Gastroenterology

## 2024-01-19 ENCOUNTER — Encounter: Payer: Self-pay | Admitting: Gastroenterology

## 2024-01-19 VITALS — BP 104/70 | HR 95 | Ht 64.0 in | Wt 152.0 lb

## 2024-01-19 DIAGNOSIS — Z860101 Personal history of adenomatous and serrated colon polyps: Secondary | ICD-10-CM | POA: Diagnosis not present

## 2024-01-19 DIAGNOSIS — Z860100 Personal history of colon polyps, unspecified: Secondary | ICD-10-CM

## 2024-01-19 DIAGNOSIS — R1310 Dysphagia, unspecified: Secondary | ICD-10-CM | POA: Diagnosis not present

## 2024-01-19 DIAGNOSIS — K219 Gastro-esophageal reflux disease without esophagitis: Secondary | ICD-10-CM

## 2024-01-19 MED ORDER — SUFLAVE 178.7 G PO SOLR: 1.0000 | Freq: Once | ORAL | 0 refills | Status: AC

## 2024-01-19 NOTE — Progress Notes (Unsigned)
01/19/2024 Tracy Gray [id number] 10-30-1966   HISTORY OF PRESENT ILLNESS: ***         Past Medical History:  Diagnosis Date   Adenomatous colon polyp 03/26/2018   3 adenomas, max 10mm   Allergy    Anxiety    Asthma    Chronic headache    COPD (chronic obstructive pulmonary disease) (HCC)    Degenerative disc disease    Fibromyalgia    GERD (gastroesophageal reflux disease)    Hyperlipidemia    Hypertension    Multiple sclerosis (HCC)    Nerve damage    OSA (obstructive sleep apnea)    not on CPAP    Sleep apnea    Past Surgical History:  Procedure Laterality Date   APPENDECTOMY     ARTHROCENTESIS INJECTION Left 06/14/2022   Procedure: LEFT ARTHROCENTESIS INJECTION;  Surgeon: Ocie Doyne, DMD;  Location: [facility] OR;  Service: Oral Surgery;  Laterality: Left;  jaw   ARTHROTOMY Right 06/14/2022   Procedure: RIGHT TEMPORAL MANDIBULAR JOINT ARTHROTOMY WITH MENISECTOMY AND FAT GRAFT;  Surgeon: Ocie Doyne, DMD;  Location: [facility] OR;  Service: Oral Surgery;  Laterality: Right;   COLONOSCOPY     DILATION AND CURETTAGE OF UTERUS     DIRECT LARYNGOSCOPY N/A 08/25/2021   Procedure: DIRECT LARYNGOSCOPY AND ESOPHAGOSCOPY, REMOVAL OF FOREIGN BODY;  Surgeon: Osborn Coho, MD;  Location: [facility] OR;  Service: ENT;  Laterality: N/A;   ECTOPIC PREGNANCY SURGERY     ESOPHAGEAL MANOMETRY N/A 09/07/2018   Procedure: ESOPHAGEAL MANOMETRY (EM);  Surgeon: Napoleon Form, MD;  Location: [facility];  Service: Endoscopy;  Laterality: N/A;   ESOPHAGOGASTRODUODENOSCOPY     EXPLORATORY LAPAROTOMY     SINUS ENDO W/FUSION Bilateral 07/11/2021   Procedure: ENDOSCOPIC SINUS SURGERY WITH FUSION NAVIGATION;  Surgeon: Osborn Coho, MD;  Location: [facility];  Service: ENT;  Laterality: Bilateral;   SINUS IRRIGATION     TEMPOROMANDIBULAR JOINT SURGERY     TUBAL LIGATION     TURBINATE REDUCTION Bilateral 07/11/2021   Procedure: BILATERAL INFERIOR TURBINATE REDUCTION;   Surgeon: Osborn Coho, MD;  Location: [facility];  Service: ENT;  Laterality: Bilateral;    reports that she quit smoking about 5 years ago. Her smoking use included cigarettes. She started smoking about 51 years ago. She has a 68.2 pack-year smoking history. She has never used smokeless tobacco. She reports that she does not drink alcohol and does not use drugs. family history includes Asthma in her daughter, grandchild, and son; Breast cancer in her paternal aunt; Cirrhosis in her maternal uncle; Emphysema in her father and mother; Hiatal hernia in her mother; Irritable bowel syndrome in her mother; Other in her mother; Prostate cancer in her father. Allergies  Allergen Reactions   Bee Venom Anaphylaxis   Penicillins Anaphylaxis   Dog Epithelium     Other Reaction(s): Other (See Comments)  Reaction to Asthma throat closing   Protonix [Pantoprazole Sodium] Diarrhea      Outpatient Encounter Medications as of 01/19/2024  Medication Sig   albuterol (PROVENTIL) (2.5 MG/3ML) 0.083% nebulizer solution Take 2.5 mg by nebulization every 4 (four) hours as needed for wheezing or shortness of breath. PLAN C   albuterol (VENTOLIN HFA) 108 (90 Base) MCG/ACT inhaler Inhale 2 puffs into the lungs every 4 (four) hours as needed for wheezing or shortness of breath.   amLODipine (NORVASC) 10 MG tablet Take 10 mg by mouth daily.   Artificial Tear Ointment (DRY EYES  OP) Place 1 drop into both eyes daily as needed (dry eyes).   Budeson-Glycopyrrol-Formoterol (BREZTRI AEROSPHERE) 160-9-4.8 MCG/ACT AERO Inhale 2 puffs into the lungs in the morning and at bedtime.   clonazePAM (KLONOPIN) 1 MG tablet Take 2 mg by mouth at bedtime.   EPINEPHrine 0.3 mg/0.3 mL IJ SOAJ injection Inject 0.3 mg into the muscle as needed for anaphylaxis.   famotidine (PEPCID) 20 MG tablet TAKE 2 TABLETS BY MOUTH DAILY AT BEDTIME   fluticasone (FLONASE) 50 MCG/ACT nasal spray Place 1 spray into both nostrils 3 (three)  times daily as needed for allergies.   HYDROcodone-acetaminophen (NORCO) 7.5-325 MG tablet Take 1 tablet by mouth in the morning, at noon, and at bedtime.   ibuprofen (ADVIL) 200 MG tablet Take 200 mg by mouth every 6 (six) hours as needed.   montelukast (SINGULAIR) 10 MG tablet Take 10 mg by mouth at bedtime.   predniSONE (DELTASONE) 20 MG tablet Take 40 Mg daily for 3 days then 20 Mg daily for 3 days then 10 Mg daily for 3 days then stop   QUININE SULFATE PO Take 300 mg by mouth daily. Ordered from [place], pt taking 2  a day   Respiratory Therapy Supplies (FLUTTER) DEVI Use as directed   rosuvastatin (CRESTOR) 20 MG tablet Take 20 mg by mouth daily.   sodium chloride (OCEAN) 0.65 % nasal spray Place 1 spray into both nostrils as needed (to keep moisture in nose).   ipratropium-albuterol (DUONEB) 0.5-2.5 (3) MG/3ML SOLN Take 3 mLs by nebulization every 4 (four) hours as needed for up to 20 days.   No facility-administered encounter medications on file as of 01/19/2024.     REVIEW OF SYSTEMS  : All other systems reviewed and negative except where noted in the History of Present Illness.   PHYSICAL EXAM: BP 104/70   Pulse 95   Ht 5\' 4"  (1.626 m)   Wt 152 lb (68.9 kg)   BMI 26.09 kg/m  General: Well developed white female in no acute distress Head: Normocephalic and atraumatic Eyes:  sclerae anicteric,conjunctive pink. Ears: Normal auditory acuity Neck: Supple, no masses.  Lungs: Clear throughout to auscultation Heart: Regular rate and rhythm Abdomen: Soft, nontender, non distended. No masses or hepatomegaly noted. Normal bowel sounds Rectal: *** Musculoskeletal: Symmetrical with no gross deformities  Skin: No lesions on visible extremities Extremities: No edema  Neurological: Alert oriented x 4, grossly nonfocal Cervical Nodes:  No significant cervical adenopathy Psychological:  Alert and cooperative. Normal mood and affect  ASSESSMENT AND PLAN:    CC:  Burton Apley,  MD

## 2024-01-19 NOTE — Patient Instructions (Signed)
You will receive your bowel preparation through [facility], which ensures the lowest copay and home delivery, with outreach via text or call from an [phone] number. Please respond promptly to avoid rescheduling. If you are interested in alternative options or have any questions please contact them at [phone]  Your Provider Has Sent Your Bowel Prep Regimen To [facility] What to expect. [facility] will contact you to verify your information and collect your copay, if applicable. Enjoy the comfort of your home while we deliver your prescription to you, free of any shipping charges. Fast, FREE delivery or shipping. [facility] accepts all major insurance benefits and applies discounts & coupons  Have additional questions? [facility]'s patient care team is always here to help.  Chat: [email or web address] Call: [phone] Email: [email or web address] NCPDP: [id number] How will we contact you? Welcome Phone call  a Welcome text and a Checkout link in a text Texts you receive from [phone] Are Not Spam.   *To set up delivery, you must complete the checkout process via link or speak to one of our patient care representatives. If we are unable to reach you, your prescription may be delayed.  You have been scheduled for a colonoscopy. Please follow written instructions given to you at your visit today.   Please pick up your prep supplies at the pharmacy within the next 1-3 days.  If you use inhalers (even only as needed), please bring them with you on the day of your procedure.  DO NOT TAKE 7 DAYS PRIOR TO TEST- Trulicity (dulaglutide) Ozempic, Wegovy (semaglutide) Mounjaro (tirzepatide) Bydureon Bcise (exanatide extended release)  DO NOT TAKE 1 DAY PRIOR TO YOUR TEST Rybelsus (semaglutide) Adlyxin (lixisenatide) Victoza (liraglutide) Byetta  (exanatide) ___________________________________________________________________________  _______________________________________________________  If your blood pressure at your visit was 140/90 or greater, please contact your primary care physician to follow up on this.  _______________________________________________________  If you are age 62 or older, your body mass index should be between 23-30. Your Body mass index is 26.09 kg/m. If this is out of the aforementioned range listed, please consider follow up with your Primary Care Provider.  If you are age 32 or younger, your body mass index should be between 19-25. Your Body mass index is 26.09 kg/m. If this is out of the aformentioned range listed, please consider follow up with your Primary Care Provider.   ________________________________________________________  The [facility] providers would like to encourage you to use [facility] to communicate with providers for non-urgent requests or questions.  Due to long hold times on the telephone, sending your provider a message by [facility] may be a faster and more efficient way to get a response.  Please allow 48 business hours for a response.  Please remember that this is for non-urgent requests.  _______________________________________________________

## 2024-01-22 ENCOUNTER — Encounter: Payer: Self-pay | Admitting: Gastroenterology

## 2024-01-22 DIAGNOSIS — Z860100 Personal history of colon polyps, unspecified: Secondary | ICD-10-CM | POA: Insufficient documentation

## 2024-02-10 ENCOUNTER — Ambulatory Visit: Payer: 59 | Admitting: Primary Care

## 2024-02-25 ENCOUNTER — Encounter: Payer: 59 | Admitting: Internal Medicine

## 2024-02-27 ENCOUNTER — Encounter: Payer: Self-pay | Admitting: Primary Care

## 2024-04-06 ENCOUNTER — Encounter: Admitting: Internal Medicine

## 2024-04-22 ENCOUNTER — Encounter: Payer: Self-pay | Admitting: Acute Care

## 2024-05-04 ENCOUNTER — Telehealth: Payer: Self-pay | Admitting: Internal Medicine

## 2024-05-04 NOTE — Telephone Encounter (Signed)
 Per chart patient has not been on Stiolto in >1 year. Patient should currently be on Breztri . Patient is also overdue for follow up. Refill denied with [facility] via phone.

## 2024-05-04 NOTE — Telephone Encounter (Signed)
 Incoming faxed refill request from [facility] ----Stiolto Respimat  Aer 2.5-2.42mcg---total 3*4 GM=12 GM  last fill date 02/28/2024    Prescription help line [phone]   fax [phone]

## 2024-05-18 NOTE — Progress Notes (Unsigned)
 [facility] History and Physical   Primary Care Physician:  Dudley Ghee, MD   Reason for Procedure:  Dysphagia, history of colon polyps  Plan:    EGD, esophageal dilation, colonoscopy     HPI: Tracy Gray is a 58 y.o. female presenting for evaluation and treatment of dysphagia and a surveillance colonoscopy due to a history of colon polyps.  She was seen in the clinic in January 2025 by Loa Riling, PA-C and these procedures were scheduled.  She was reporting pill and food dysphagia for several months at that time.  She was on famotidine  40 mg daily, she reports that PPIs cause diarrhea.  EGD in 2019 with grade a reflux esophagitis, a 54 French Maloney dilator was passed.  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  Colonoscopy in April 2019 revealed 3 polyps, the largest 10 mm, all adenomas.  A repeat colonoscopy was recommended 3 years after that.  Today history is same - she also says she gets a chest pressure relieved by burping. No classic heartburn.   Past Medical History:  Diagnosis Date   Adenomatous colon polyp 03/26/2018   3 adenomas, max 10mm   Allergy     Anxiety    Asthma    Chronic headache    COPD (chronic obstructive pulmonary disease) (HCC)    Degenerative disc disease    Fibromyalgia    GERD (gastroesophageal reflux disease)    Hyperlipidemia    Hypertension    Multiple sclerosis (HCC)    Nerve damage    OSA (obstructive sleep apnea)    not on CPAP    Sleep apnea     Past Surgical History:  Procedure Laterality Date   APPENDECTOMY     ARTHROCENTESIS INJECTION Left 06/14/2022   Procedure: LEFT ARTHROCENTESIS INJECTION;  Surgeon: Ascencion Lava, DMD;  Location: [facility] OR;  Service: Oral Surgery;  Laterality: Left;  jaw   ARTHROTOMY Right 06/14/2022   Procedure: RIGHT TEMPORAL MANDIBULAR JOINT ARTHROTOMY WITH MENISECTOMY AND FAT GRAFT;  Surgeon: Ascencion Lava, DMD;  Location: [facility] OR;  Service: Oral Surgery;  Laterality: Right;   COLONOSCOPY     DILATION AND CURETTAGE OF UTERUS     DIRECT LARYNGOSCOPY N/A 08/25/2021   Procedure: DIRECT LARYNGOSCOPY AND ESOPHAGOSCOPY, REMOVAL OF FOREIGN BODY;  Surgeon: Ammon Bales, MD;  Location: [facility];  Service: ENT;  Laterality: N/A;   ECTOPIC PREGNANCY SURGERY     ESOPHAGEAL MANOMETRY N/A 09/07/2018   Procedure: ESOPHAGEAL MANOMETRY (EM);  Surgeon: Sergio Dandy, MD;  Location: [facility];  Service: Endoscopy;  Laterality: N/A;   ESOPHAGOGASTRODUODENOSCOPY     EXPLORATORY LAPAROTOMY     SINUS ENDO W/FUSION Bilateral 07/11/2021   Procedure: ENDOSCOPIC SINUS SURGERY WITH FUSION NAVIGATION;  Surgeon: Ammon Bales, MD;  Location: [facility];  Service: ENT;  Laterality: Bilateral;   SINUS IRRIGATION     TEMPOROMANDIBULAR JOINT SURGERY     TUBAL LIGATION     TURBINATE REDUCTION Bilateral 07/11/2021   Procedure: BILATERAL INFERIOR TURBINATE REDUCTION;  Surgeon: Ammon Bales, MD;  Location: [facility];  Service: ENT;  Laterality: Bilateral;    Prior to Admission medications   Medication Sig Start Date End Date Taking? Authorizing Provider  albuterol  (PROVENTIL ) (2.5 MG/3ML) 0.083% nebulizer solution Take 2.5 mg by nebulization every 4 (four) hours as needed for wheezing or shortness of breath. PLAN C    [provider]  albuterol  (VENTOLIN  HFA)  108 (90 Base) MCG/ACT inhaler Inhale 2 puffs into the lungs every 4 (four) hours as needed for wheezing or shortness of breath.    [provider]  amLODipine  (NORVASC ) 10 MG tablet Take 10 mg by mouth daily. 04/29/22   [provider]  Artificial Tear Ointment (DRY EYES OP) Place 1 drop into both eyes daily as needed (dry eyes).    [provider]  Budeson-Glycopyrrol-Formoterol  (BREZTRI  AEROSPHERE) 160-9-4.8 MCG/ACT AERO Inhale 2 puffs into the lungs in the morning and at bedtime. 01/03/24    Antonio Baumgarten, NP  clonazePAM  (KLONOPIN ) 1 MG tablet Take 2 mg by mouth at bedtime.    [provider]  EPINEPHrine  0.3 mg/0.3 mL IJ SOAJ injection Inject 0.3 mg into the muscle as needed for anaphylaxis.    [provider]  famotidine  (PEPCID ) 20 MG tablet TAKE 2 TABLETS BY MOUTH DAILY AT BEDTIME 10/09/22   Kenney Peacemaker, MD  fluticasone  [facility]) 50 MCG/ACT nasal spray Place 1 spray into both nostrils 3 (three) times daily as needed for allergies. 07/24/21   [provider]  HYDROcodone -acetaminophen  (NORCO) 7.5-325 MG tablet Take 1 tablet by mouth in the morning, at noon, and at bedtime.    [provider]  ibuprofen  (ADVIL ) 200 MG tablet Take 200 mg by mouth every 6 (six) hours as needed.    [provider]  ipratropium-albuterol  (DUONEB) 0.5-2.5 (3) MG/3ML SOLN Take 3 mLs by nebulization every 4 (four) hours as needed for up to 20 days. 09/24/23 10/14/23  Deforest Fast, MD  montelukast (SINGULAIR) 10 MG tablet Take 10 mg by mouth at bedtime.    [provider]  predniSONE  (DELTASONE ) 20 MG tablet Take 40 Mg daily for 3 days then 20 Mg daily for 3 days then 10 Mg daily for 3 days then stop 09/25/23   Deforest Fast, MD  QUININE SULFATE PO Take 300 mg by mouth daily. Ordered from [place], pt taking 2  a day    [provider]  Respiratory Therapy Supplies (FLUTTER) DEVI Use as directed 11/05/17   Diamond Formica, MD  rosuvastatin  (CRESTOR ) 20 MG tablet Take 20 mg by mouth daily.    [provider]  sodium chloride  (OCEAN) 0.65 % nasal spray Place 1 spray into both nostrils as needed (to keep moisture in nose). 03/05/18   [provider]    Current Outpatient Medications  Medication Sig Dispense Refill   albuterol  (PROVENTIL ) (2.5 MG/3ML) 0.083% nebulizer solution Take 2.5 mg by nebulization every 4 (four) hours as needed for wheezing or shortness of breath. PLAN C     albuterol  (VENTOLIN  HFA) 108 (90 Base)  MCG/ACT inhaler Inhale 2 puffs into the lungs every 4 (four) hours as needed for wheezing or shortness of breath.     amLODipine  (NORVASC ) 10 MG tablet Take 10 mg by mouth daily.     Artificial Tear Ointment (DRY EYES OP) Place 1 drop into both eyes daily as needed (dry eyes).     Budeson-Glycopyrrol-Formoterol  (BREZTRI  AEROSPHERE) 160-9-4.8 MCG/ACT AERO Inhale 2 puffs into the lungs in the morning and at bedtime. 10.7 g 6   clonazePAM  (KLONOPIN ) 1 MG tablet Take 2 mg by mouth at bedtime.     famotidine  (PEPCID ) 20 MG tablet TAKE 2 TABLETS BY MOUTH DAILY AT BEDTIME 180 tablet 0   fluticasone  (FLONASE) 50 MCG/ACT nasal spray Place 1 spray into both nostrils 3 (three) times daily as needed for allergies.     HYDROcodone -acetaminophen  (NORCO) 7.5-325  MG tablet Take 1 tablet by mouth in the morning, at noon, and at bedtime.     ibuprofen  (ADVIL ) 200 MG tablet Take 200 mg by mouth every 6 (six) hours as needed.     montelukast (SINGULAIR) 10 MG tablet Take 10 mg by mouth at bedtime.     QUININE SULFATE PO Take 300 mg by mouth daily. Ordered from [place], pt taking 2  a day     EPINEPHrine  0.3 mg/0.3 mL IJ SOAJ injection Inject 0.3 mg into the muscle as needed for anaphylaxis.     ipratropium-albuterol  (DUONEB) 0.5-2.5 (3) MG/3ML SOLN Take 3 mLs by nebulization every 4 (four) hours as needed for up to 20 days. 360 mL 0   predniSONE  (DELTASONE ) 20 MG tablet Take 40 Mg daily for 3 days then 20 Mg daily for 3 days then 10 Mg daily for 3 days then stop (Patient not taking: Reported on 05/19/2024) 20 tablet 0   Respiratory Therapy Supplies (FLUTTER) DEVI Use as directed 1 each 0   rosuvastatin  (CRESTOR ) 20 MG tablet Take 20 mg by mouth daily. (Patient not taking: Reported on 05/19/2024)     sodium chloride  (OCEAN) 0.65 % nasal spray Place 1 spray into both nostrils as needed (to keep moisture in nose).     Current Facility-Administered Medications  Medication Dose Route Frequency Provider Last Rate Last Admin    0.9 %  sodium chloride  infusion  500 mL Intravenous Once Kenney Peacemaker, MD        Allergies as of 05/19/2024 - Review Complete 05/19/2024  Allergen Reaction Noted   Bee venom Anaphylaxis 10/09/2012   Dog epithelium Other (See Comments) 06/27/2022   Penicillins Anaphylaxis 10/09/2012   Protonix  [pantoprazole  sodium] Diarrhea 12/08/2018    Family History  Problem Relation Age of Onset   Emphysema Mother        never smoked   Irritable bowel syndrome Mother    Hiatal hernia Mother    Other Mother        esophageal stricture   Emphysema Father        smoked   Prostate cancer Father    Breast cancer Paternal Aunt    Asthma Daughter    Asthma Son    Asthma Grandchild    Cirrhosis Maternal Uncle        alcohol related x 6 uncles   Esophageal cancer Neg Hx    Colon cancer Neg Hx    Pancreatic cancer Neg Hx    Stomach cancer Neg Hx     Social History   Socioeconomic History   Marital status: Legally Separated    Spouse name: Not on file   Number of children: 3   Years of education: Not on file   Highest education level: Not on file  Occupational History   Occupation: Unemployed  Tobacco Use   Smoking status: Former    Current packs/day: 0.00    Average packs/day: 1.5 packs/day for 45.4 years (68.2 ttl pk-yrs)    Types: Cigarettes    Start date: 98    Quit date: 06/03/2018    Years since quitting: 5.9   Smokeless tobacco: Never   Tobacco comments:       Vaping Use   Vaping status: Former   Quit date: 12/23/2018  Substance and Sexual Activity   Alcohol use: No    Alcohol/week: 0.0 standard drinks of alcohol   Drug use: No   Sexual activity: Not Currently    Birth control/protection: Post-menopausal  Comment: BTL  Other Topics Concern   Not on file  Social History Narrative   Legally separated not working on Medicare disability   3 children some grandchildren   Smoker no alcohol   No cigarettes x2 months as of 12/08/2018      Social Drivers of  Health   Financial Resource Strain: Not on file  Food Insecurity: No Food Insecurity (09/19/2023)   Hunger Vital Sign    Worried About Running Out of Food in the Last Year: Never true    Ran Out of Food in the Last Year: Never true  Transportation Needs: No Transportation Needs (09/19/2023)   PRAPARE - Administrator, Civil Service (Medical): No    Lack of Transportation (Non-Medical): No  Physical Activity: Not on file  Stress: Not on file  Social Connections: Not on file  Intimate Partner Violence: Not At Risk (09/19/2023)   Humiliation, Afraid, Rape, and Kick questionnaire    Fear of Current or Ex-Partner: No    Emotionally Abused: No    Physically Abused: No    Sexually Abused: No    Review of Systems:  All other review of systems negative except as mentioned in the HPI.  Physical Exam: Vital signs BP 103/77   Pulse 84   Temp 97.7 F (36.5 C)   Resp 14   SpO2 98%   General:   Alert,  Well-developed, well-nourished, pleasant and cooperative in NAD Lungs:  Clear throughout to auscultation.   Heart:  Regular rate and rhythm; no murmurs, clicks, rubs,  or gallops. Abdomen:  Soft, nontender and nondistended. Normal bowel sounds.   Neuro/Psych:  Alert and cooperative. Normal mood and affect. A and O x 3   @Halden Phegley  Tammie Fall, MD, [facility] [phone] (pager) 05/19/2024 8:09 AM@

## 2024-05-19 ENCOUNTER — Ambulatory Visit: Admitting: Internal Medicine

## 2024-05-19 ENCOUNTER — Encounter: Payer: Self-pay | Admitting: Internal Medicine

## 2024-05-19 VITALS — BP 110/57 | HR 75 | Temp 97.7°F | Resp 16

## 2024-05-19 DIAGNOSIS — D120 Benign neoplasm of cecum: Secondary | ICD-10-CM

## 2024-05-19 DIAGNOSIS — K222 Esophageal obstruction: Secondary | ICD-10-CM | POA: Diagnosis not present

## 2024-05-19 DIAGNOSIS — D125 Benign neoplasm of sigmoid colon: Secondary | ICD-10-CM

## 2024-05-19 DIAGNOSIS — Z1211 Encounter for screening for malignant neoplasm of colon: Secondary | ICD-10-CM

## 2024-05-19 DIAGNOSIS — K5520 Angiodysplasia of colon without hemorrhage: Secondary | ICD-10-CM | POA: Insufficient documentation

## 2024-05-19 DIAGNOSIS — R1310 Dysphagia, unspecified: Secondary | ICD-10-CM | POA: Diagnosis not present

## 2024-05-19 DIAGNOSIS — D124 Benign neoplasm of descending colon: Secondary | ICD-10-CM

## 2024-05-19 DIAGNOSIS — Z860101 Personal history of adenomatous and serrated colon polyps: Secondary | ICD-10-CM

## 2024-05-19 DIAGNOSIS — Z860100 Personal history of colon polyps, unspecified: Secondary | ICD-10-CM

## 2024-05-19 DIAGNOSIS — K449 Diaphragmatic hernia without obstruction or gangrene: Secondary | ICD-10-CM | POA: Diagnosis not present

## 2024-05-19 MED ORDER — SODIUM CHLORIDE 0.9 % IV SOLN: 500.0000 mL | Freq: Once | INTRAVENOUS | Status: DC

## 2024-05-19 NOTE — Patient Instructions (Addendum)
 I dilated the esophagus today it was narrowed some where the esophagus meets the stomach - hope that helps you swallow better. Also saw a small hiatal hernia - please read the handout.  Colonoscopy revealed 4 small polyps that I removed.  Also have something called AVM's - blood vessels on the surface - these can leak blood or bleed more. If they do we can treat them but leave alone unless causing problems.  I will let you know pathology results and when to have another routine colonoscopy by mail and/or My Chart.  I appreciate the opportunity to care for you. Kenney Peacemaker, MD, [facility]  **Handouts given on GERD/ hiatal hernia, Post dilation diet and polyps**   YOU HAD AN ENDOSCOPIC PROCEDURE TODAY AT THE [facility]:   Refer to the procedure report that was given to you for any specific questions about what was found during the examination.  If the procedure report does not answer your questions, please call your gastroenterologist to clarify.  If you requested that your care partner not be given the details of your procedure findings, then the procedure report has been included in a sealed envelope for you to review at your convenience later.  YOU SHOULD EXPECT: Some feelings of bloating in the abdomen. Passage of more gas than usual.  Walking can help get rid of the air that was put into your GI tract during the procedure and reduce the bloating. If you had a lower endoscopy (such as a colonoscopy or flexible sigmoidoscopy) you may notice spotting of blood in your stool or on the toilet paper. If you underwent a bowel prep for your procedure, you may not have a normal bowel movement for a few days.  Please Note:  You might notice some irritation and congestion in your nose or some drainage.  This is from the oxygen used during your procedure.  There is no need for concern and it should clear up in a day or so.  SYMPTOMS TO REPORT IMMEDIATELY:  Following lower endoscopy  (colonoscopy or flexible sigmoidoscopy):  Excessive amounts of blood in the stool  Significant tenderness or worsening of abdominal pains  Swelling of the abdomen that is new, acute  Fever of 100F or higher  Following upper endoscopy (EGD)  Vomiting of blood or coffee ground material  New chest pain or pain under the shoulder blades  Painful or persistently difficult swallowing  New shortness of breath  Fever of 100F or higher  Black, tarry-looking stools  For urgent or emergent issues, a gastroenterologist can be reached at any hour by calling [phone]. Do not use MyChart messaging for urgent concerns.    DIET:  Start with a clear liquid diet for 1 hour (until 9:45 am) then a soft diet for the rest of the day. Tomorrow you may proceed to your regular diet.  Drink plenty of fluids but you should avoid alcoholic beverages for 24 hours.  ACTIVITY:  You should plan to take it easy for the rest of today and you should NOT DRIVE or use heavy machinery until tomorrow (because of the sedation medicines used during the test).    FOLLOW UP: Our staff will call the number listed on your records the next business day following your procedure.  We will call around 7:15- 8:00 am to check on you and address any questions or concerns that you may have regarding the information given to you following your procedure. If we do not reach you, we  will leave a message.     If any biopsies were taken you will be contacted by phone or by letter within the next 1-3 weeks.  Please call us  at [phone] if you have not heard about the biopsies in 3 weeks.    SIGNATURES/CONFIDENTIALITY: You and/or your care partner have signed paperwork which will be entered into your electronic medical record.  These signatures attest to the fact that that the information above on your After Visit Summary has been reviewed and is understood.  Full responsibility of the confidentiality of this discharge information  lies with you and/or your care-partner.

## 2024-05-19 NOTE — Op Note (Signed)
 [facility] Patient Name: Tracy Gray Procedure Date: 05/19/2024 7:24 AM MRN: [id number] Endoscopist: Kenney Peacemaker , MD, [id number] Age: 58 Referring MD:  Date of Birth: [birth year] Gender: Female Account #: [id number] Procedure:                Colonoscopy Indications:              Surveillance: Personal history of adenomatous                            polyps on last colonoscopy > 3 years ago, Last                            colonoscopy: 2019 Medicines:                Monitored Anesthesia Care Procedure:                Pre-Anesthesia Assessment:                           - Prior to the procedure, a History and Physical                            was performed, and patient medications and                            allergies were reviewed. The patient's tolerance of                            previous anesthesia was also reviewed. The risks                            and benefits of the procedure and the sedation                            options and risks were discussed with the patient.                            All questions were answered, and informed consent                            was obtained. Prior Anticoagulants: The patient has                            taken no anticoagulant or antiplatelet agents. ASA                            Grade Assessment: III - A patient with severe                            systemic disease. After reviewing the risks and                            benefits, the patient was deemed in satisfactory  condition to undergo the procedure.                           After obtaining informed consent, the colonoscope                            was passed under direct vision. Throughout the                            procedure, the patient's blood pressure, pulse, and                            oxygen saturations were monitored continuously. The                            Olympus CF-HQ190L ([id number]) Colonoscope  was                            introduced through the anus and advanced to the the                            cecum, identified by appendiceal orifice and                            ileocecal valve. The colonoscopy was performed                            without difficulty. The patient tolerated the                            procedure well. The quality of the bowel                            preparation was adequate. The bowel preparation                            used was SUFLAVE  via split dose instruction. Scope In: 8:24:00 AM Scope Out: 8:45:39 AM Scope Withdrawal Time: 0 hours 18 minutes 26 seconds  Total Procedure Duration: 0 hours 21 minutes 39 seconds  Findings:                 The perianal and digital rectal examinations were                            normal.                           Four sessile polyps were found in the sigmoid                            colon, descending colon and ileocecal valve. The                            polyps were 3 to 6 mm in size. These polyps were  removed with a cold snare. Resection and retrieval                            were complete. Verification of patient                            identification for the specimen was done. Estimated                            blood loss was minimal.                           A few angiodysplastic lesions without bleeding were                            found in the cecum.                           The exam was otherwise without abnormality on                            direct and retroflexion views. Complications:            No immediate complications. Estimated Blood Loss:     Estimated blood loss was minimal. Impression:               - Four 3 to 6 mm polyps in the sigmoid colon, in                            the descending colon and at the ileocecal valve,                            removed with a cold snare. Resected and retrieved.                           - A few  non-bleeding colonic angiodysplastic                            lesions.                           - The examination was otherwise normal on direct                            and retroflexion views.                           - Personal history of colonic polyps. 3 adenomas                            2019, max 10 mm Recommendation:           - Patient has a contact number available for                            emergencies. The signs and symptoms of potential  delayed complications were discussed with the                            patient. Return to normal activities tomorrow.                            Written discharge instructions were provided to the                            patient.                           - Clear liquids x 1 hour then soft foods rest of                            day. Start prior diet tomorrow. (had esophageal                            dilation today)                           - Continue present medications.                           - Repeat colonoscopy is recommended for                            surveillance. The colonoscopy date will be                            determined after pathology results from today's                            exam become available for review.                           - See the other procedure note for documentation of                            additional recommendations. Kenney Peacemaker, MD 05/19/2024 9:04:16 AM This report has been signed electronically.

## 2024-05-19 NOTE — Progress Notes (Signed)
 Pt's states no medical or surgical changes since previsit or office visit.

## 2024-05-19 NOTE — Op Note (Signed)
 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 The patient's tolerance of                            previous anesthesia was also reviewed. The risks                            and benefits of the procedure and the sedation                            options and risks were discussed with the patient.                            All questions were answered, and informed consent                            was obtained. Prior Anticoagulants: The patient has                            taken no anticoagulant or antiplatelet agents. ASA                            Grade Assessment: III - A patient with severe                            systemic disease. After reviewing the risks and  benefits, the patient was deemed in satisfactory                            condition to undergo the procedure.                           After obtaining informed consent, the endoscope was                            passed under direct vision. Throughout the                            procedure, the patient's blood pressure, pulse, and                            oxygen saturations were monitored  continuously. The                            Olympus Scope [id number] was introduced through the                            mouth, and advanced to the second part of duodenum.                            The upper GI endoscopy was accomplished without                            difficulty. The patient tolerated the procedure                            well. Scope In: Scope Out: Findings:                 A mild Schatzki ring was found at the                            gastroesophageal junction. A TTS dilator was passed                            through the scope. Dilation with an 18-19-20 mm                            balloon dilator was performed to 20 mm. No impact                            with 18 mm, freely mobile. The dilation site was                            examined and showed moderate mucosal disruption.                            Estimated blood loss was minimal.                           A  3 cm hiatal hernia was present.                           The gastroesophageal flap valve was visualized                            endoscopically and classified as Hill Grade II                            (fold present, opens with respiration).                           The exam was otherwise without abnormality.                           The cardia and gastric fundus were otherwise normal                            on retroflexion. Complications:            No immediate complications. Estimated Blood Loss:     Estimated blood loss was minimal. Impression:               - Mild Schatzki ring. Dilated. 20 mm maximum.                           - 3 cm hiatal hernia.                           - Gastroesophageal flap valve classified as Hill                            Grade II (fold present, opens with respiration).                           - The examination was otherwise normal.                           - No specimens collected. Recommendation:           - Patient has a contact number  available for                            emergencies. The signs and symptoms of potential                            delayed complications were discussed with the                            patient. Return to normal activities tomorrow.                            Written discharge instructions were provided to the                            patient.                           -  Clear liquids x 1 hour then soft foods rest of                            day. Start prior diet tomorrow.                           - Continue present medications. She cannot take PPI                            due to diarrhea                           - See the other procedure note for documentation of                            additional recommendations. Kenney Peacemaker, MD 05/19/2024 8:59:20 AM This report has been signed electronically.

## 2024-05-19 NOTE — Progress Notes (Signed)
0805 Robinul 0.1 mg IV given due large amount of secretions upon assessment.  MD made aware, vss  

## 2024-05-19 NOTE — Progress Notes (Signed)
 Report given to PACU, vss

## 2024-05-19 NOTE — Progress Notes (Signed)
 4098 Albuterol  neb given for wheezing, vss

## 2024-05-20 ENCOUNTER — Telehealth: Payer: Self-pay

## 2024-05-20 NOTE — Telephone Encounter (Signed)
  Follow up Call-     05/19/2024    7:44 AM  Call back number  Post procedure Call Back phone  # [phone]  Permission to leave phone message Yes     Patient questions:  Do you have a fever, pain , or abdominal swelling? No. Pain Score  0 *  Have you tolerated food without any problems? Yes.    Have you been able to return to your normal activities? Yes.    Do you have any questions about your discharge instructions: Diet   No. Medications  No. Follow up visit  No.  Do you have questions or concerns about your Care? No.  Actions: * If pain score is 4 or above: No action needed, pain <4.

## 2024-05-21 ENCOUNTER — Ambulatory Visit: Payer: Self-pay | Admitting: Internal Medicine

## 2024-05-21 LAB — SURGICAL PATHOLOGY

## 2024-07-14 ENCOUNTER — Encounter: Payer: Self-pay | Admitting: Acute Care

## 2024-10-08 ENCOUNTER — Encounter: Payer: Self-pay | Admitting: Emergency Medicine

## 2024-10-08 ENCOUNTER — Encounter (HOSPITAL_COMMUNITY): Payer: Self-pay | Admitting: Emergency Medicine

## 2024-10-08 ENCOUNTER — Other Ambulatory Visit: Payer: Self-pay

## 2024-10-08 ENCOUNTER — Telehealth: Payer: Self-pay | Admitting: Nurse Practitioner

## 2024-10-08 ENCOUNTER — Ambulatory Visit (INDEPENDENT_AMBULATORY_CARE_PROVIDER_SITE_OTHER)

## 2024-10-08 ENCOUNTER — Ambulatory Visit: Admission: EM | Admit: 2024-10-08 | Discharge: 2024-10-08 | Disposition: A | Discharge status: Home / Self Care

## 2024-10-08 ENCOUNTER — Inpatient Hospital Stay (HOSPITAL_COMMUNITY)
Admission: EM | Admit: 2024-10-08 | Discharge: 2024-10-12 | DRG: 191 | Disposition: A | Discharge status: Home / Self Care | Attending: Internal Medicine | Admitting: Internal Medicine

## 2024-10-08 ENCOUNTER — Ambulatory Visit
Admission: RE | Admit: 2024-10-08 | Discharge: 2024-10-08 | Disposition: A | Discharge status: Home / Self Care | Source: Ambulatory Visit

## 2024-10-08 DIAGNOSIS — Z79899 Other long term (current) drug therapy: Secondary | ICD-10-CM

## 2024-10-08 DIAGNOSIS — E860 Dehydration: Secondary | ICD-10-CM | POA: Diagnosis present

## 2024-10-08 DIAGNOSIS — J441 Chronic obstructive pulmonary disease with (acute) exacerbation: Principal | ICD-10-CM | POA: Diagnosis present

## 2024-10-08 DIAGNOSIS — Z91030 Bee allergy status: Secondary | ICD-10-CM

## 2024-10-08 DIAGNOSIS — J439 Emphysema, unspecified: Secondary | ICD-10-CM | POA: Diagnosis present

## 2024-10-08 DIAGNOSIS — R059 Cough, unspecified: Secondary | ICD-10-CM

## 2024-10-08 DIAGNOSIS — F419 Anxiety disorder, unspecified: Secondary | ICD-10-CM | POA: Diagnosis present

## 2024-10-08 DIAGNOSIS — G35D Multiple sclerosis, unspecified: Secondary | ICD-10-CM | POA: Diagnosis present

## 2024-10-08 DIAGNOSIS — E7800 Pure hypercholesterolemia, unspecified: Secondary | ICD-10-CM | POA: Diagnosis present

## 2024-10-08 DIAGNOSIS — I10 Essential (primary) hypertension: Secondary | ICD-10-CM | POA: Diagnosis present

## 2024-10-08 DIAGNOSIS — G4733 Obstructive sleep apnea (adult) (pediatric): Secondary | ICD-10-CM | POA: Diagnosis present

## 2024-10-08 DIAGNOSIS — K219 Gastro-esophageal reflux disease without esophagitis: Secondary | ICD-10-CM | POA: Diagnosis present

## 2024-10-08 DIAGNOSIS — M797 Fibromyalgia: Secondary | ICD-10-CM | POA: Diagnosis present

## 2024-10-08 DIAGNOSIS — G8929 Other chronic pain: Secondary | ICD-10-CM | POA: Diagnosis present

## 2024-10-08 DIAGNOSIS — Z825 Family history of asthma and other chronic lower respiratory diseases: Secondary | ICD-10-CM

## 2024-10-08 DIAGNOSIS — Z87892 Personal history of anaphylaxis: Secondary | ICD-10-CM

## 2024-10-08 DIAGNOSIS — Z803 Family history of malignant neoplasm of breast: Secondary | ICD-10-CM

## 2024-10-08 DIAGNOSIS — Z880 Allergy status to penicillin: Secondary | ICD-10-CM

## 2024-10-08 DIAGNOSIS — F17210 Nicotine dependence, cigarettes, uncomplicated: Secondary | ICD-10-CM

## 2024-10-08 DIAGNOSIS — E872 Acidosis, unspecified: Secondary | ICD-10-CM | POA: Diagnosis present

## 2024-10-08 DIAGNOSIS — B9789 Other viral agents as the cause of diseases classified elsewhere: Secondary | ICD-10-CM | POA: Diagnosis present

## 2024-10-08 DIAGNOSIS — I959 Hypotension, unspecified: Secondary | ICD-10-CM

## 2024-10-08 DIAGNOSIS — Z860101 Personal history of adenomatous and serrated colon polyps: Secondary | ICD-10-CM

## 2024-10-08 DIAGNOSIS — Z7951 Long term (current) use of inhaled steroids: Secondary | ICD-10-CM

## 2024-10-08 DIAGNOSIS — Z87891 Personal history of nicotine dependence: Secondary | ICD-10-CM

## 2024-10-08 DIAGNOSIS — I2510 Atherosclerotic heart disease of native coronary artery without angina pectoris: Secondary | ICD-10-CM | POA: Diagnosis present

## 2024-10-08 DIAGNOSIS — Z8042 Family history of malignant neoplasm of prostate: Secondary | ICD-10-CM

## 2024-10-08 LAB — POCT RESPIRATORY SYNCYTIAL VIRUS: RSV Rapid Ag: NEGATIVE

## 2024-10-08 LAB — BASIC METABOLIC PANEL WITH GFR
Anion gap: 17 — ABNORMAL HIGH (ref 5–15)
BUN: 10 mg/dL (ref 6–20)
CO2: 24 mmol/L (ref 22–32)
Calcium: 9.5 mg/dL (ref 8.9–10.3)
Chloride: 100 mmol/L (ref 98–111)
Creatinine, Ser: 0.91 mg/dL (ref 0.44–1.00)
GFR, Estimated: >60 mL/min (ref >60)
Glucose, Bld: 166 mg/dL — ABNORMAL HIGH (ref 70–99)
Potassium: 3.6 mmol/L (ref 3.5–5.1)
Sodium: 141 mmol/L (ref 135–145)

## 2024-10-08 LAB — POC COVID19/FLU A&B COMBO
Covid Antigen, POC: NEGATIVE
Influenza A Antigen, POC: NEGATIVE
Influenza B Antigen, POC: NEGATIVE

## 2024-10-08 MED ORDER — IPRATROPIUM-ALBUTEROL 0.5-2.5 (3) MG/3ML IN SOLN
3.0000 mL | Freq: Once | RESPIRATORY_TRACT | Status: AC
  Administered 2024-10-08: 3 mL via RESPIRATORY_TRACT

## 2024-10-08 MED ORDER — ALBUTEROL SULFATE (2.5 MG/3ML) 0.083% IN NEBU
10.0000 mg/h | INHALATION_SOLUTION | Freq: Once | RESPIRATORY_TRACT | Status: AC
  Administered 2024-10-08: 10 mg/h via RESPIRATORY_TRACT
  Filled 2024-10-08: qty 3

## 2024-10-08 MED ORDER — METHYLPREDNISOLONE SODIUM SUCC 125 MG IJ SOLR
125.0000 mg | Freq: Once | INTRAMUSCULAR | Status: AC
  Administered 2024-10-08: 125 mg via INTRAVENOUS
  Filled 2024-10-08: qty 2

## 2024-10-08 MED ORDER — IPRATROPIUM BROMIDE 0.02 % IN SOLN
0.5000 mg | Freq: Once | RESPIRATORY_TRACT | Status: AC
  Administered 2024-10-08: 0.5 mg via RESPIRATORY_TRACT
  Filled 2024-10-08: qty 2.5

## 2024-10-08 MED ORDER — METHYLPREDNISOLONE SODIUM SUCC 125 MG IJ SOLR
80.0000 mg | Freq: Once | INTRAMUSCULAR | Status: AC
  Administered 2024-10-08: 80 mg via INTRAMUSCULAR

## 2024-10-08 MED ADMIN — Magnesium Sulfate IV Soln 2 GM/50ML (40 MG/ML): 2 g | INTRAVENOUS | NDC 83634050041

## 2024-10-08 MED ADMIN — Lactated Ringer's Solution: 1000 mL | INTRAVENOUS | NDC 00338011704

## 2024-10-08 MED FILL — Magnesium Sulfate IV Soln 2 GM/50ML (40 MG/ML): 2.0000 g | INTRAVENOUS | Qty: 50 | Status: AC

## 2024-10-08 NOTE — ED Triage Notes (Signed)
 Pt with hx of COPD.  Seen today at [facility] and sent for further eval of shob.  Given duoneb and steroids there with no relief of symptoms.  Pt has used several treatments at home as well without relief.  Pt reports not being able to exert herself at all without losing her breath.

## 2024-10-08 NOTE — Telephone Encounter (Signed)
 Called pt to follow up on check in at ED. First call was successful at 7:19 pm. 2nd call was not answered. No VM left.

## 2024-10-08 NOTE — Discharge Instructions (Addendum)
 You were seen today for worsening shortness of breath, congestion, and cough. Your evaluation showed signs of a flare-up of your COPD and emphysema. Your chest X-ray showed chronic changes from your lung disease but no new infection or pneumonia. Your flu, COVID, and RSV tests were negative. However, your symptoms are very similar to a previous episode last year that required hospitalization, and your breathing has not improved enough with the medications used in the clinic. You received a breathing treatment (DuoNeb) while here which didn't really provide any improvement. You were also given an injection of steroids. Because of your history of severe lung disease and the similarity of your symptoms to your prior hospitalization, you need further evaluation and treatment in the emergency department. You will go directly to the hospital from here with a responsible adult driving you. At the hospital, they can monitor your breathing more closely, perform additional testing, and provide treatments that are not available in the clinic.

## 2024-10-08 NOTE — ED Provider Notes (Signed)
 [place] EMERGENCY DEPARTMENT AT [facility] Provider Note   CSN: [id number] Arrival date & time: 10/08/24  8042     Patient presents with: Shortness of Breath   Tracy Gray is a 58 y.o. female.    Shortness of Breath    Patient has a history of COPD, former cigarette smoking, hypercholesterolemia, emphysema, hypertension.  Patient states she started having trouble with her breathing over the last few days.  Initially she had about a week of nasal congestion as well as chest congestion and a cough.  Family member was recently ill.  She has had increasing shortness of breath and is getting short of breath walking very short distances now.  She has had some fevers at home.  She has not had any issues with nausea vomiting or diarrhea.  She started having some episodes of lightheadedness and increased use of her MDI.  She went to an urgent care who noted her symptoms.  She was also mildly hypotensive.  She had a chest x-ray that showed emphysematous changes without definite pneumonia.  She was sent to the ED for further evaluation  Prior to Admission medications   Medication Sig Start Date End Date Taking? Authorizing Provider  albuterol  (PROVENTIL ) (2.5 MG/3ML) 0.083% nebulizer solution Take 2.5 mg by nebulization every 4 (four) hours as needed for wheezing or shortness of breath. PLAN C    [provider]  albuterol  (VENTOLIN  HFA) 108 (90 Base) MCG/ACT inhaler Inhale 2 puffs into the lungs every 4 (four) hours as needed for wheezing or shortness of breath.    [provider]  amLODipine  (NORVASC ) 10 MG tablet Take 10 mg by mouth daily. 04/29/22   [provider]  Artificial Tear Ointment (DRY EYES OP) Place 1 drop into both eyes daily as needed (dry eyes).    [provider]  atorvastatin (LIPITOR) 10 MG tablet 1 tablet Orally Once a day    [provider]  BREO ELLIPTA  100-25 MCG/ACT AEPB 1 puff daily. Patient not taking:  Reported on 10/08/2024 04/25/24   [provider]  Budeson-Glycopyrrol-Formoterol  (BREZTRI  AEROSPHERE) 160-9-4.8 MCG/ACT AERO Inhale 2 puffs into the lungs in the morning and at bedtime. 01/03/24   Hope Almarie ORN, NP  budesonide -formoterol  (SYMBICORT ) 160-4.5 MCG/ACT inhaler 2 puffs Inhalation Twice a day    [provider]  clonazePAM  (KLONOPIN ) 1 MG tablet Take 2 mg by mouth at bedtime.    [provider]  cloNIDine (CATAPRES) 0.2 MG tablet Take 0.2 mg by mouth daily. Patient not taking: Reported on 10/08/2024 04/24/24   [provider]  cycloSPORINE (RESTASIS) 0.05 % ophthalmic emulsion 1  into affected eye Ophthalmic Twice a day    [provider]  EPINEPHrine  0.3 mg/0.3 mL IJ SOAJ injection Inject 0.3 mg into the muscle as needed for anaphylaxis.    [provider]  estradiol (ESTRACE) 0.5 MG tablet 1 tablet Orally Daily for Three Weeks, 1 Week off Patient not taking: Reported on 10/08/2024    [provider]  famotidine  (PEPCID ) 20 MG tablet TAKE 2 TABLETS BY MOUTH DAILY AT BEDTIME 10/09/22   Avram Lupita BRAVO, MD  fluticasone  [facility]) 50 MCG/ACT nasal spray Place 1 spray into both nostrils 3 (three) times daily as needed for allergies. 07/24/21   [provider]  HYDROcodone -acetaminophen  (NORCO) 7.5-325 MG tablet Take 1 tablet by mouth in the morning, at noon, and at bedtime.    [provider]  ibuprofen  (ADVIL ) 200 MG tablet Take 200  mg by mouth every 6 (six) hours as needed.    [provider]  ipratropium (ATROVENT) 0.02 % nebulizer solution Take by nebulization 4 (four) times daily. 07/19/24   [provider]  ipratropium-albuterol  (DUONEB) 0.5-2.5 (3) MG/3ML SOLN Take 3 mLs by nebulization every 4 (four) hours as needed for up to 20 days. 09/24/23 10/08/24  Fairy Frames, MD  lisinopril-hydrochlorothiazide (ZESTORETIC) 10-12.5 MG tablet Take 1 tablet by mouth daily. 04/30/24   [provider]  medroxyPROGESTERone (PROVERA) 5 MG tablet 1 tablet Orally Once a day Patient not taking: Reported on 10/08/2024    [provider]  metroNIDAZOLE (METROCREAM) 0.75 % cream Apply 1 Application topically. Patient not taking: Reported on 10/08/2024    [provider]  mexiletine (MEXITIL) 150 MG capsule 1 capsule. Patient not taking: Reported on 10/08/2024    [provider]  montelukast (SINGULAIR) 10 MG tablet Take 10 mg by mouth at bedtime.    [provider]  Olopatadine HCl (PATADAY) 0.2 % SOLN 1 drop into affected eye Ophthalmic Once a day Patient not taking: Reported on 10/08/2024    [provider]  pantoprazole  (PROTONIX ) 40 MG tablet 1 tablet Orally Once a day    [provider]  PERMETHRIN EX Quinine Sulfate 300mg  po bid prn cramps Patient not taking: Reported on 10/08/2024 12/26/23   [provider]  predniSONE  (DELTASONE ) 20 MG tablet Take 40 Mg daily for 3 days then 20 Mg daily for 3 days then 10 Mg daily for 3 days then stop Patient not taking: Reported on 05/19/2024 09/25/23   Fairy Frames, MD  QUININE SULFATE PO Take 300 mg by mouth daily. Ordered from [place], pt taking 2  a day Patient not taking: Reported on 10/08/2024    [provider]  Respiratory Therapy Supplies (FLUTTER) DEVI Use as directed 11/05/17   Darlean Ozell NOVAK, MD  rosuvastatin  (CRESTOR ) 20 MG tablet Take 20 mg by mouth daily. Patient not taking: Reported on 05/19/2024    [provider]  sodium chloride  (OCEAN) 0.65 % nasal spray Place 1 spray into both nostrils as needed (to keep moisture in nose). Patient not taking: Reported on 10/08/2024 03/05/18   [provider]    Allergies: Bee venom, Dog epithelium, Penicillamine, Penicillins, and Protonix  [pantoprazole  sodium]    Review of Systems  Respiratory:  Positive for shortness of breath.     Updated Vital Signs BP 94/62   Pulse 83   Temp 97.9 F (36.6  C) (Oral)   Resp 18   SpO2 96%   Physical Exam Vitals and nursing note reviewed.  Constitutional:      Appearance: She is well-developed. She is not diaphoretic.  HENT:     Head: Normocephalic and atraumatic.     Right Ear: External ear normal.     Left Ear: External ear normal.  Eyes:     General: No scleral icterus.       Right eye: No discharge.        Left eye: No discharge.     Conjunctiva/sclera: Conjunctivae normal.  Neck:     Trachea: No tracheal deviation.  Cardiovascular:     Rate and Rhythm: Normal rate and regular rhythm.  Pulmonary:     Effort: Accessory muscle usage present. No tachypnea or respiratory distress.     Breath sounds: No stridor. Wheezing present. No rales.  Abdominal:     General: Bowel sounds are normal. There is no distension.  Palpations: Abdomen is soft.     Tenderness: There is no abdominal tenderness. There is no guarding or rebound.  Musculoskeletal:        General: No tenderness or deformity.     Cervical back: Neck supple.     Right lower leg: No edema.     Left lower leg: No edema.  Skin:    General: Skin is warm and dry.     Findings: No rash.  Neurological:     General: No focal deficit present.     Mental Status: She is alert.     Cranial Nerves: No cranial nerve deficit, dysarthria or facial asymmetry.     Sensory: No sensory deficit.     Motor: No abnormal muscle tone or seizure activity.     Coordination: Coordination normal.  Psychiatric:        Mood and Affect: Mood normal.     (all labs ordered are listed, but only abnormal results are displayed) Labs Reviewed - No data to display  EKG: None  Radiology: DG Chest 2 View Result Date: 10/08/2024 EXAM: 2 VIEW(S) XRAY OF THE CHEST 10/08/2024 03:22:28 PM COMPARISON: Comparison 09/19/2023. CLINICAL HISTORY: cough, wheeze, sob - hx severe emphysema and COPD. Pt reports exposure to RSV from grandchild, nasal \\T \ chest congestion, productive cough (green sputum), and  dyspnea x 1 week. Hx of COPD. Catching her breath talking during triage. Pulse ox: 94% in triage. FINDINGS: LUNGS AND PLEURA: Hyperinflation of the lungs is noted. Emphysematous disease is noted predominantly in the upper lobes. Minimal bibasilar scarring or subsegmental atelectasis is noted. No pulmonary edema. No pleural effusion. No pneumothorax. HEART AND MEDIASTINUM: No acute abnormality of the cardiac and mediastinal silhouettes. BONES AND SOFT TISSUES: No acute osseous abnormality. IMPRESSION: 1. 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 2. Minimal bibasilar scarring or subsegmental atelectasis. Electronically signed by: Lynwood Seip MD 10/08/2024 04:07 PM EDT RP Workstation: [id number]     Procedures   Medications Ordered in the ED - No data to display                                  Medical Decision Making Amount and/or Complexity of Data Reviewed Labs: ordered.  Risk Prescription drug management.   Patient presented with shortness of breath cough congestion symptoms concerning for COPD exacerbation.  Patient had notable wheezing on exam.  At the urgent care she already had an x-ray as well as COVID flu RSV test that were negative.  Patient was started on steroids breathing treatment magnesium.  While she was in the ED she developed a temperature to 100.5.  IV fluids were ordered laboratory tests to check for lactic acidosis and blood cultures were added on.  Empiric antibiotics were ordered.  Care turned over to Dr. Trine at shift change     Final diagnoses:  None    ED Discharge Orders     None          Randol Simmonds, MD 10/09/24 0000

## 2024-10-08 NOTE — ED Triage Notes (Addendum)
 Pt reports exposure to RSV from grandchild, nasal & chest congestion, productive cough (green sputum), and dyspnea x 1 week. Hx of COPD. Catching her breath talking during triage. Pulse ox: 94% in triage. Pt has tried albuterol  neb txts, duoneb txts, advair, symbicort , breztri , and ventolin  inhalers with no relief from symptoms. Pt has never had RSV vaccine. Pt reports loss of appetite due to being sick and low blood pressures. Has not taken BP meds in 1 week.

## 2024-10-08 NOTE — Telephone Encounter (Signed)
 Patient was referred to the emergency department from urgent care earlier this afternoon. Review of [facility] records shows no evidence of the patient being evaluated or checked in at any ED facility. A follow-up call was made to the patient at 7:19 PM, during which she stated she was en route to the hospital and "almost there." A recheck at 8:18 PM showed the patient still had not been registered at any ED. Another call was placed to the patient at that time, but there was no answer.

## 2024-10-08 NOTE — ED Provider Notes (Signed)
 [name]    CSN: [id number] Arrival date & time: 10/08/24  1402      History   Chief Complaint Chief Complaint  Patient presents with   Nasal Congestion   Cough    HPI Tracy Gray is a 58 y.o. female.   Discussed the use of AI scribe software for clinical note transcription with the patient, who gave verbal consent to proceed.   The patient with a history of advanced emphysema, COPD, and hypertension presents with one week of nasal and chest congestion, productive cough, and worsening shortness of breath following exposure to her grandchild who recently had RSV. Her illness began with sneezing and rhinorrhea and has since progressed to severe dyspnea on exertion. She reports being unable to walk from the exam room to the front desk without becoming short of breath and describes the sensation as an elephant sitting on my chest. Speaking triggers wheezing and shortness of breath.  She has a productive cough with mucus that seems to originate deep in the chest and sinuses, consistent with her history of multiple sinus surgeries. The mucus has recently changed color. Associated symptoms include fevers, chills, and body aches. She reports a fever of 104F that broke two nights ago and a fever of 101F the following day, with no fever on the day of the visit. She also notes diarrhea but denies nausea, vomiting, or lower extremity swelling.  She experiences palpitations and lightheadedness when choking on phlegm, describing transient visual darkening and near-syncope. 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 She is a former smoker who quit in 2019 but continues to have secondhand smoke exposure at home. She has been using her nebulizer and MDI frequently. She also is compliant with her prescribed COPD therapies which include Symbicort , Breztri , and Singulair. Patient reports minimal relief. Her last nebulizer treatment was at 6 AM on the day  of the visit.  She has not received the RSV vaccine. She stopped taking her blood pressure medications for a week due to episodes of low blood pressure but has a known history of hypertension. Her recent vital signs at a neurology visit on 08/19/24 showed a blood pressure of 141/57 mmHg and oxygen saturation of 94%. Her last hospitalization for COPD occurred from September 27 to September 25, 2023.  The following sections of the patient's history were reviewed and updated as appropriate: allergies, current medications, past family history, past medical history, past social history, past surgical history, and problem list.          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   History of colonic polyps 01/22/2024   Acute respiratory failure with hypoxia (HCC) 09/20/2023   Normocytic anemia 09/20/2023   Hypotension 09/19/2023   Former cigarette  smoker 05/13/2022  Swallowed foreign body 08/25/2021   Eustachian tube dysfunction, left 04/03/2021   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   Bilateral temporomandibular joint pain 01/02/2021   Thrush 02/07/2020   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   Dysphagia    Chronic back pain 08/21/2018   Hypercholesterolemia 08/21/2018   Pulmonary emphysema (HCC) 08/21/2018   Hypertensive disorder 08/21/2018   Chronic obstructive pulmonary disease (HCC) 08/21/2018    Gastroesophageal reflux disease without esophagitis 12/08/2017   Maxillary sinusitis, chronic 11/26/2016   COPD exacerbation (HCC) 07/16/2016   Obstructive sleep apnea 05/10/2016   Deviated nasal septum 03/22/2016   Nasal turbinate hypertrophy 03/22/2016   Seasonal allergic rhinitis 03/22/2016   COPD  GOLD III with min reversiblity  03/01/2016   Sinusitis, chronic 03/01/2016   Cigarette smoker 02/03/2016   Upper airway cough syndrome 02/02/2016   Dyspnea 02/02/2016   Muscle cramps 12/24/2013    Past Surgical History:  Procedure Laterality Date   APPENDECTOMY     ARTHROCENTESIS INJECTION Left 06/14/2022   Procedure: LEFT ARTHROCENTESIS INJECTION;  Surgeon: Sheryle Hamilton, DMD;  Location: [facility] OR;  Service: Oral Surgery;  Laterality: Left;  jaw   ARTHROTOMY Right 06/14/2022   Procedure: RIGHT TEMPORAL MANDIBULAR JOINT ARTHROTOMY WITH MENISECTOMY AND FAT GRAFT;  Surgeon: Sheryle Hamilton, DMD;  Location: [facility] OR;  Service: Oral Surgery;  Laterality: Right;   COLONOSCOPY     DILATION AND CURETTAGE OF UTERUS     DIRECT LARYNGOSCOPY N/A 08/25/2021   Procedure: DIRECT LARYNGOSCOPY AND ESOPHAGOSCOPY, REMOVAL OF FOREIGN BODY;  Surgeon: Mable Lenis, MD;  Location: [facility] OR;  Service: ENT;  Laterality: N/A;   ECTOPIC PREGNANCY SURGERY     ESOPHAGEAL MANOMETRY N/A 09/07/2018   Procedure: ESOPHAGEAL MANOMETRY (EM);  Surgeon: Shila Gustav GAILS, MD;  Location: [facility];  Service: Endoscopy;  Laterality: N/A;   ESOPHAGOGASTRODUODENOSCOPY     EXPLORATORY LAPAROTOMY     SINUS ENDO W/FUSION Bilateral 07/11/2021   Procedure: ENDOSCOPIC SINUS SURGERY WITH FUSION NAVIGATION;  Surgeon: Mable Lenis, MD;  Location: [facility];  Service: ENT;  Laterality: Bilateral;   SINUS IRRIGATION     TEMPOROMANDIBULAR JOINT SURGERY     TUBAL LIGATION     TURBINATE REDUCTION Bilateral 07/11/2021   Procedure: BILATERAL INFERIOR TURBINATE REDUCTION;  Surgeon: Mable Lenis, MD;  Location: [facility];  Service: ENT;  Laterality: Bilateral;    OB History   No obstetric history on file.      Home Medications    Prior to Admission medications   Medication Sig Start Date End Date Taking? Authorizing Provider  albuterol  (PROVENTIL ) (2.5 MG/3ML) 0.083% nebulizer solution Take 2.5 mg by nebulization every 4 (four) hours as needed for wheezing or shortness of breath. PLAN C   Yes [provider]  albuterol  (VENTOLIN  HFA) 108 (90 Base) MCG/ACT inhaler Inhale 2 puffs into the lungs every 4 (four) hours as needed for wheezing or shortness of breath.   Yes [provider]  amLODipine  (NORVASC ) 10 MG tablet Take 10 mg by mouth daily. 04/29/22  Yes [provider]  Artificial Tear Ointment (DRY EYES OP) Place 1 drop into both eyes daily as needed (dry eyes).   Yes [provider]  atorvastatin (LIPITOR) 10 MG tablet 1 tablet Orally Once a day   Yes [provider]  Budeson-Glycopyrrol-Formoterol  (BREZTRI  AEROSPHERE) 160-9-4.8 MCG/ACT AERO Inhale 2 puffs into the lungs in the morning and at bedtime. 01/03/24  Yes Hope Almarie ORN, NP  budesonide -formoterol  (SYMBICORT ) 160-4.5 MCG/ACT inhaler 2  puffs Inhalation Twice a day   Yes [provider]  clonazePAM  (KLONOPIN ) 1 MG tablet Take 2 mg by mouth at bedtime.   Yes [provider]  cycloSPORINE (RESTASIS) 0.05 % ophthalmic emulsion 1  into affected eye Ophthalmic Twice a day   Yes [provider]  famotidine  (PEPCID ) 20 MG tablet TAKE 2 TABLETS BY MOUTH DAILY AT BEDTIME 10/09/22  Yes Avram Lupita BRAVO, MD  fluticasone  [facility]) 50 MCG/ACT nasal spray Place 1 spray into both nostrils 3 (three) times daily as needed for allergies. 07/24/21  Yes [provider]  HYDROcodone -acetaminophen  (NORCO) 7.5-325 MG tablet Take 1 tablet by mouth in the morning, at noon, and at bedtime.   Yes [provider]  ibuprofen  (ADVIL ) 200 MG tablet Take 200 mg by mouth every 6  (six) hours as needed.   Yes [provider]  ipratropium (ATROVENT) 0.02 % nebulizer solution Take by nebulization 4 (four) times daily. 07/19/24  Yes [provider]  ipratropium-albuterol  (DUONEB) 0.5-2.5 (3) MG/3ML SOLN Take 3 mLs by nebulization every 4 (four) hours as needed for up to 20 days. 09/24/23 10/08/24 Yes Fairy Frames, MD  lisinopril-hydrochlorothiazide (ZESTORETIC) 10-12.5 MG tablet Take 1 tablet by mouth daily. 04/30/24  Yes [provider]  montelukast (SINGULAIR) 10 MG tablet Take 10 mg by mouth at bedtime.   Yes [provider]  pantoprazole  (PROTONIX ) 40 MG tablet 1 tablet Orally Once a day   Yes [provider]  Respiratory Therapy Supplies (FLUTTER) DEVI Use as directed 11/05/17  Yes Darlean Ozell NOVAK, MD  BREO ELLIPTA  100-25 MCG/ACT AEPB 1 puff daily. Patient not taking: Reported on 10/08/2024 04/25/24   [provider]  cloNIDine (CATAPRES) 0.2 MG tablet Take 0.2 mg by mouth daily. Patient not taking: Reported on 10/08/2024 04/24/24   [provider]  EPINEPHrine  0.3 mg/0.3 mL IJ SOAJ injection Inject 0.3 mg into the muscle as needed for anaphylaxis.    [provider]  estradiol (ESTRACE) 0.5 MG tablet 1 tablet Orally Daily for Three Weeks, 1 Week off Patient not taking: Reported on 10/08/2024    [provider]  medroxyPROGESTERone (PROVERA) 5 MG tablet 1 tablet Orally Once a day Patient not taking: Reported on 10/08/2024    [provider]  metroNIDAZOLE (METROCREAM) 0.75 % cream Apply 1 Application topically. Patient not taking: Reported on 10/08/2024    [provider]  mexiletine (MEXITIL) 150 MG capsule 1 capsule. Patient not taking: Reported on 10/08/2024    [provider]  Olopatadine HCl (PATADAY) 0.2 % SOLN 1 drop into affected eye Ophthalmic Once a day Patient not taking: Reported on 10/08/2024    [provider]  PERMETHRIN EX Quinine Sulfate  300mg  po bid prn cramps Patient not taking: Reported on 10/08/2024 12/26/23   [provider]  predniSONE  (DELTASONE ) 20 MG tablet Take 40 Mg daily for 3 days then 20 Mg daily for 3 days then 10 Mg daily for 3 days then stop Patient not taking: Reported on 05/19/2024 09/25/23   Fairy Frames, MD  QUININE SULFATE PO Take 300 mg by mouth daily. Ordered from [place], pt taking 2  a day Patient not taking: Reported on 10/08/2024    [provider]  rosuvastatin  (CRESTOR ) 20 MG tablet Take 20 mg by mouth daily. Patient not taking: Reported on 05/19/2024    [provider]  sodium chloride  (OCEAN) 0.65 % nasal spray Place 1 spray into both nostrils as needed (to keep moisture in nose).  Patient not taking: Reported on 10/08/2024 03/05/18   [provider]    Family History Family History  Problem Relation Age of Onset   Emphysema Mother        never smoked   Irritable bowel syndrome Mother    Hiatal hernia Mother    Other Mother        esophageal stricture   Emphysema Father        smoked   Prostate cancer Father    Breast cancer Paternal Aunt    Asthma Daughter    Asthma Son    Asthma Grandchild    Cirrhosis Maternal Uncle        alcohol related x 6 uncles   Esophageal cancer Neg Hx    Colon cancer Neg Hx    Pancreatic cancer Neg Hx    Stomach cancer Neg Hx     Social History Social History   Tobacco Use   Smoking status: Former    Current packs/day: 0.00    Average packs/day: 1.5 packs/day for 45.4 years (68.2 ttl pk-yrs)    Types: Cigarettes    Start date: 86    Quit date: 06/03/2018    Years since quitting: 6.3   Smokeless tobacco: Never   Tobacco comments:       Vaping Use   Vaping status: Former   Quit date: 12/23/2018  Substance Use Topics   Alcohol use: No    Alcohol/week: 0.0 standard drinks of alcohol   Drug use: No     Allergies   Bee venom, Dog epithelium, Penicillamine, Penicillins, and Protonix  [pantoprazole   sodium]   Review of Systems Review of Systems  Constitutional:  Positive for chills, diaphoresis, fatigue and fever (TMAX 104).  HENT:  Positive for congestion, rhinorrhea and sneezing. Negative for ear pain (pressure in both ears).   Eyes:  Positive for discharge.  Respiratory:  Positive for cough, choking (from sputum during cough), chest tightness, shortness of breath and wheezing.   Cardiovascular:  Positive for palpitations. Negative for leg swelling.  Gastrointestinal:  Positive for diarrhea. Negative for nausea and vomiting.  Genitourinary:        Urinary incontinence due to coughing    Neurological:  Positive for dizziness and headaches.  All other systems reviewed and are negative.    Physical Exam Triage Vital Signs ED Triage Vitals  Encounter Vitals Group     BP 10/08/24 1426 (!) 88/57     Girls Systolic BP Percentile --      Girls Diastolic BP Percentile --      Boys Systolic BP Percentile --      Boys Diastolic BP Percentile --      Pulse Rate 10/08/24 1426 (!) 103     Resp 10/08/24 1426 (!) 28     Temp 10/08/24 1426 97.7 F (36.5 C)     Temp Source 10/08/24 1426 Oral     SpO2 10/08/24 1426 94 %     Weight --      Height --      Head Circumference --      Peak Flow --      Pain Score 10/08/24 1428 4     Pain Loc --      Pain Education --      Exclude from Growth Chart --    No data found.  Updated Vital Signs BP 91/60 (BP Location: Left Arm)   Pulse 87   Temp 98.5 F (36.9 C) (Oral)   Resp 18  SpO2 96%   Visual Acuity Right Eye Distance:   Left Eye Distance:   Bilateral Distance:    Right Eye Near:   Left Eye Near:    Bilateral Near:     Physical Exam Vitals reviewed.  Constitutional:      General: She is awake. She is not in acute distress.    Appearance: Normal appearance. She is well-developed and well-groomed. She is not ill-appearing, toxic-appearing or diaphoretic.  HENT:     Head: Normocephalic.     Right Ear: Tympanic  membrane, ear canal and external ear normal. No drainage, swelling or tenderness. No middle ear effusion. Tympanic membrane is not erythematous.     Left Ear: Tympanic membrane, ear canal and external ear normal. No drainage, swelling or tenderness.  No middle ear effusion. Tympanic membrane is not erythematous.     Nose: Nose normal. No congestion or rhinorrhea.     Mouth/Throat:     Lips: Pink.     Mouth: Mucous membranes are moist.     Pharynx: Oropharynx is clear. Uvula midline. No pharyngeal swelling, oropharyngeal exudate, posterior oropharyngeal erythema or uvula swelling.     Tonsils: No tonsillar exudate or tonsillar abscesses.  Eyes:     General: Vision grossly intact.     Conjunctiva/sclera: Conjunctivae normal.  Cardiovascular:     Rate and Rhythm: Normal rate.     Pulses: Normal pulses.     Heart sounds: Normal heart sounds.  Pulmonary:     Effort: Pulmonary effort is normal. Tachypnea present. No respiratory distress.     Breath sounds: Decreased air movement present. Wheezing present.     Comments: Patient is able to speak in full sentences but becomes mildly short of breath during conversation. Lung examination reveals decreased air movement at the bases and expiratory wheezing heard in the upper airways. Tachypnea is present; however, there is no use of accessory muscles. The patient remains in no acute respiratory distress.  Abdominal:     Palpations: Abdomen is soft.  Musculoskeletal:        General: Normal range of motion.     Cervical back: Normal range of motion and neck supple.     Right lower leg: No edema.     Left lower leg: No edema.  Lymphadenopathy:     Cervical: No cervical adenopathy.  Skin:    General: Skin is warm and dry.  Neurological:     General: No focal deficit present.     Mental Status: She is alert and oriented to person, place, and time.     Sensory: Sensation is intact.     Motor: Motor function is intact.     Coordination: Coordination  is intact.     Gait: Gait is intact.  Psychiatric:        Mood and Affect: Mood and affect normal.        Speech: Speech normal.        Behavior: Behavior is cooperative.      UC Treatments / Results  Labs (all labs ordered are listed, but only abnormal results are displayed) Labs Reviewed  POC COVID19/FLU A&B COMBO - Normal  POCT RESPIRATORY SYNCYTIAL VIRUS    EKG   Radiology DG Chest 2 View Result Date: 10/08/2024 EXAM: 2 VIEW(S) XRAY OF THE CHEST 10/08/2024 03:22:28 PM COMPARISON: Comparison 09/19/2023. CLINICAL HISTORY: cough, wheeze, sob - hx severe emphysema and COPD. Pt reports exposure to RSV from grandchild, nasal \\T \ chest congestion, productive cough (green sputum), and dyspnea  x 1 week. Hx of COPD. Catching her breath talking during triage. Pulse ox: 94% in triage. FINDINGS: LUNGS AND PLEURA: Hyperinflation of the lungs is noted. Emphysematous disease is noted predominantly in the upper lobes. Minimal bibasilar scarring or subsegmental atelectasis is noted. No pulmonary edema. No pleural effusion. No pneumothorax. HEART AND MEDIASTINUM: No acute abnormality of the cardiac and mediastinal silhouettes. BONES AND SOFT TISSUES: No acute osseous abnormality. IMPRESSION: 1. 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 2. Minimal bibasilar scarring or subsegmental atelectasis. Electronically signed by: Lynwood Seip MD 10/08/2024 04:07 PM EDT RP Workstation: [id number]    Procedures Procedures (including critical care time)  Medications Ordered in UC Medications  methylPREDNISolone  sodium succinate (SOLU-MEDROL ) 125 mg/2 mL injection 80 mg (has no administration in time range)  ipratropium-albuterol  (DUONEB) 0.5-2.5 (3) MG/3ML nebulizer solution 3 mL (3 mLs Nebulization Given 10/08/24 1532)    Initial Impression / Assessment and Plan / UC Course  I have reviewed the triage vital signs and the nursing notes.  Pertinent labs &  imaging results that were available during my care of the patient were reviewed by me and considered in my medical decision making (see chart for details).      The patient with a history of advanced emphysema, COPD, and hypertension presents with one week of worsening nasal and chest congestion, productive cough, and shortness of breath following RSV exposure. Her presentation is notable for severe dyspnea on exertion, productive cough with thick mucus, and intermittent chest tightness. Examination revealed decreased air movement at the bases with expiratory wheezing in the upper airways, mild tachypnea, and some breathlessness during speech, though she remained in no acute respiratory distress. She was alert, afebrile, and nontoxic. No hypoxia.   Review of prior records shows her current symptoms and exam findings closely mirror her hospitalization from September 27 to September 25, 2023, during which she was positive for rhinovirus and experienced similar hypotension and respiratory compromise. Flu, COVID, and RSV tests today were negative, but rhinovirus testing is unavailable in clinic. Chest X-ray demonstrates hyperinflation and emphysematous changes consistent with her chronic COPD but no acute infiltrates.  Given her severe underlying pulmonary disease, persistence of symptoms despite frequent use of nebulizer and inhaled therapies, and the similarity of her current presentation to her prior hospitalization, further evaluation and likely inpatient management are warranted. A DuoNeb treatment was administered in clinic with no improvement. Solu-Medrol  80 mg IM also given. The patient is stable for transport and will proceed to the emergency department via private vehicle accompanied by a responsible adult. She is agreeable with this plan and understands the need for further monitoring and care.  The patient presents with symptoms and exam findings that warrant a higher level of care and further  evaluation. Given the clinical picture, emergency department (ED) assessment is recommended for advanced diagnostics and management. The patient is currently stable and appropriate for transport via private vehicle. A responsible adult will be driving. The patient is alert, oriented, demonstrates clear mental status, good insight into their illness, and sound judgment in making decisions regarding their care. The risks, rationale, and need for ED evaluation were discussed, and the patient is agreeable to the plan.  Documentation was completed with the aid of voice recognition software. Transcription may contain typographical errors. Final Clinical Impressions(s) / UC Diagnoses   Final diagnoses:  Cough, unspecified type  COPD exacerbation (HCC)  Hypotension, unspecified hypotension type     Discharge Instructions  You were seen today for worsening shortness of breath, congestion, and cough. Your evaluation showed signs of a flare-up of your COPD and emphysema. Your chest X-ray showed chronic changes from your lung disease but no new infection or pneumonia. Your flu, COVID, and RSV tests were negative. However, your symptoms are very similar to a previous episode last year that required hospitalization, and your breathing has not improved enough with the medications used in the clinic. You received a breathing treatment (DuoNeb) while here which didn't really provide any improvement. You were also given an injection of steroids. Because of your history of severe lung disease and the similarity of your symptoms to your prior hospitalization, you need further evaluation and treatment in the emergency department. You will go directly to the hospital from here with a responsible adult driving you. At the hospital, they can monitor your breathing more closely, perform additional testing, and provide treatments that are not available in the clinic.      ED Prescriptions   None    PDMP not  reviewed this encounter.   Iola Lukes, [place] 10/08/24 [phone]

## 2024-10-09 DIAGNOSIS — J441 Chronic obstructive pulmonary disease with (acute) exacerbation: Secondary | ICD-10-CM | POA: Diagnosis present

## 2024-10-09 LAB — CBC WITH DIFFERENTIAL/PLATELET
Abs Immature Granulocytes: 0.02 K/uL (ref 0.00–0.07)
Basophils Absolute: 0 K/uL (ref 0.0–0.1)
Basophils Relative: 0 %
Eosinophils Absolute: 0 K/uL (ref 0.0–0.5)
Eosinophils Relative: 0 %
HCT: 38.3 % (ref 36.0–46.0)
Hemoglobin: 11.7 g/dL — ABNORMAL LOW (ref 12.0–15.0)
Immature Granulocytes: 0 %
Lymphocytes Relative: 9 %
Lymphs Abs: 0.5 K/uL — ABNORMAL LOW (ref 0.7–4.0)
MCH: 29.8 pg (ref 26.0–34.0)
MCHC: 30.5 g/dL (ref 30.0–36.0)
MCV: 97.5 fL (ref 80.0–100.0)
Monocytes Absolute: 0.1 K/uL (ref 0.1–1.0)
Monocytes Relative: 1 %
Neutro Abs: 5.2 K/uL (ref 1.7–7.7)
Neutrophils Relative %: 90 %
Platelets: 293 K/uL (ref 150–400)
RBC: 3.93 MIL/uL (ref 3.87–5.11)
RDW: 15.3 % (ref 11.5–15.5)
WBC: 5.8 K/uL (ref 4.0–10.5)
nRBC: 0 % (ref 0.0–0.2)

## 2024-10-09 LAB — LACTIC ACID, PLASMA: Lactic Acid, Venous: 1.6 mmol/L (ref 0.5–1.9)

## 2024-10-09 LAB — COMPREHENSIVE METABOLIC PANEL WITH GFR
ALT: 26 U/L (ref 0–44)
AST: 32 U/L (ref 15–41)
Albumin: 3.8 g/dL (ref 3.5–5.0)
Alkaline Phosphatase: 123 U/L (ref 38–126)
Anion gap: 20 — ABNORMAL HIGH (ref 5–15)
BUN: 11 mg/dL (ref 6–20)
CO2: 18 mmol/L — ABNORMAL LOW (ref 22–32)
Calcium: 9 mg/dL (ref 8.9–10.3)
Chloride: 102 mmol/L (ref 98–111)
Creatinine, Ser: 0.81 mg/dL (ref 0.44–1.00)
GFR, Estimated: >60 mL/min (ref >60)
Glucose, Bld: 164 mg/dL — ABNORMAL HIGH (ref 70–99)
Potassium: 3.7 mmol/L (ref 3.5–5.1)
Sodium: 140 mmol/L (ref 135–145)
Total Bilirubin: <0.2 mg/dL (ref 0.0–1.2)
Total Protein: 6.4 g/dL — ABNORMAL LOW (ref 6.5–8.1)

## 2024-10-09 LAB — URINALYSIS, W/ REFLEX TO CULTURE (INFECTION SUSPECTED)
Bilirubin Urine: NEGATIVE
Glucose, UA: NEGATIVE mg/dL
Hgb urine dipstick: NEGATIVE
Ketones, ur: NEGATIVE mg/dL
Leukocytes,Ua: NEGATIVE
Nitrite: NEGATIVE
Protein, ur: NEGATIVE mg/dL
Specific Gravity, Urine: 1.009 (ref 1.005–1.030)
pH: 5 (ref 5.0–8.0)

## 2024-10-09 LAB — I-STAT CG4 LACTIC ACID, ED
Lactic Acid, Venous: 3.1 mmol/L (ref 0.5–1.9)
Lactic Acid, Venous: 3.4 mmol/L (ref 0.5–1.9)
Lactic Acid, Venous: 3.4 mmol/L (ref 0.5–1.9)

## 2024-10-09 LAB — CBC
HCT: 41.4 % (ref 36.0–46.0)
Hemoglobin: 12.8 g/dL (ref 12.0–15.0)
MCH: 29.4 pg (ref 26.0–34.0)
MCHC: 30.9 g/dL (ref 30.0–36.0)
MCV: 95 fL (ref 80.0–100.0)
Platelets: 311 K/uL (ref 150–400)
RBC: 4.36 MIL/uL (ref 3.87–5.11)
RDW: 15.1 % (ref 11.5–15.5)
WBC: 7.3 K/uL (ref 4.0–10.5)
nRBC: 0 % (ref 0.0–0.2)

## 2024-10-09 LAB — BLOOD GAS, ARTERIAL
Acid-Base Excess: 0.6 mmol/L (ref 0.0–2.0)
Bicarbonate: 24.5 mmol/L (ref 20.0–28.0)
Drawn by: 11249
FIO2: 21 %
O2 Saturation: 97 %
Patient temperature: 37.8
pCO2 arterial: 37 mmHg (ref 32–48)
pH, Arterial: 7.43 (ref 7.35–7.45)
pO2, Arterial: 92 mmHg (ref 83–108)

## 2024-10-09 LAB — RESPIRATORY PANEL BY PCR

## 2024-10-09 LAB — PRO BRAIN NATRIURETIC PEPTIDE: Pro Brain Natriuretic Peptide: 157 pg/mL (ref <300.0)

## 2024-10-09 LAB — MAGNESIUM: Magnesium: 2.3 mg/dL (ref 1.7–2.4)

## 2024-10-09 LAB — PHOSPHORUS: Phosphorus: 3.9 mg/dL (ref 2.5–4.6)

## 2024-10-09 LAB — PROCALCITONIN: Procalcitonin: <0.1 ng/mL

## 2024-10-09 MED ORDER — ONDANSETRON HCL 4 MG/2ML IJ SOLN: 4.0000 mg | Freq: Four times a day (QID) | INTRAMUSCULAR | Status: DC | PRN

## 2024-10-09 MED ORDER — IPRATROPIUM-ALBUTEROL 0.5-2.5 (3) MG/3ML IN SOLN
3.0000 mL | Freq: Four times a day (QID) | RESPIRATORY_TRACT | Status: DC
  Administered 2024-10-09: 3 mL via RESPIRATORY_TRACT
  Filled 2024-10-09: qty 3

## 2024-10-09 MED ORDER — PANTOPRAZOLE SODIUM 20 MG PO TBEC: 20.0000 mg | DELAYED_RELEASE_TABLET | Freq: Every day | ORAL | Status: DC

## 2024-10-09 MED ORDER — HYDROCODONE-ACETAMINOPHEN 7.5-325 MG PO TABS
1.0000 | ORAL_TABLET | Freq: Three times a day (TID) | ORAL | Status: DC
  Administered 2024-10-09 – 2024-10-12 (×10): 1 via ORAL
  Filled 2024-10-09 (×10): qty 1

## 2024-10-09 MED ORDER — IPRATROPIUM BROMIDE 0.02 % IN SOLN
0.5000 mg | Freq: Once | RESPIRATORY_TRACT | Status: AC
  Administered 2024-10-09: 0.5 mg via RESPIRATORY_TRACT
  Filled 2024-10-09: qty 2.5

## 2024-10-09 MED ORDER — CLONAZEPAM 0.5 MG PO TABS
2.0000 mg | ORAL_TABLET | Freq: Every day | ORAL | Status: DC
  Administered 2024-10-09 – 2024-10-11 (×3): 2 mg via ORAL
  Filled 2024-10-09 (×3): qty 4

## 2024-10-09 MED ORDER — METHYLPREDNISOLONE SODIUM SUCC 125 MG IJ SOLR
80.0000 mg | Freq: Two times a day (BID) | INTRAMUSCULAR | Status: DC
  Administered 2024-10-09 – 2024-10-12 (×7): 80 mg via INTRAVENOUS
  Filled 2024-10-09 (×7): qty 2

## 2024-10-09 MED ORDER — LACTATED RINGERS IV SOLN: INTRAVENOUS | Status: AC

## 2024-10-09 MED ORDER — SODIUM CHLORIDE 0.9 % IV SOLN
500.0000 mg | INTRAVENOUS | Status: DC
  Administered 2024-10-09: 500 mg via INTRAVENOUS
  Filled 2024-10-09: qty 5

## 2024-10-09 MED ORDER — IPRATROPIUM-ALBUTEROL 0.5-2.5 (3) MG/3ML IN SOLN: 3.0000 mL | Freq: Four times a day (QID) | RESPIRATORY_TRACT | Status: DC

## 2024-10-09 MED ORDER — BUDESON-GLYCOPYRROL-FORMOTEROL 160-9-4.8 MCG/ACT IN AERO: 2.0000 | INHALATION_SPRAY | Freq: Two times a day (BID) | RESPIRATORY_TRACT | Status: DC

## 2024-10-09 MED ORDER — ENOXAPARIN SODIUM 40 MG/0.4ML IJ SOSY
40.0000 mg | PREFILLED_SYRINGE | INTRAMUSCULAR | Status: DC
  Administered 2024-10-09 – 2024-10-12 (×4): 40 mg via SUBCUTANEOUS
  Filled 2024-10-09 (×4): qty 0.4

## 2024-10-09 MED ORDER — MELATONIN 3 MG PO TABS: 3.0000 mg | ORAL_TABLET | Freq: Every evening | ORAL | Status: DC | PRN

## 2024-10-09 MED ORDER — LEVOFLOXACIN IN D5W 750 MG/150ML IV SOLN
750.0000 mg | Freq: Once | INTRAVENOUS | Status: AC
  Administered 2024-10-09: 750 mg via INTRAVENOUS
  Filled 2024-10-09: qty 150

## 2024-10-09 MED ORDER — BUDESONIDE 0.5 MG/2ML IN SUSP
0.5000 mg | Freq: Two times a day (BID) | RESPIRATORY_TRACT | Status: DC
  Administered 2024-10-09 – 2024-10-12 (×6): 0.5 mg via RESPIRATORY_TRACT
  Filled 2024-10-09 (×6): qty 2

## 2024-10-09 MED ORDER — ATORVASTATIN CALCIUM 10 MG PO TABS
10.0000 mg | ORAL_TABLET | Freq: Every day | ORAL | Status: DC
  Administered 2024-10-09 – 2024-10-12 (×4): 10 mg via ORAL
  Filled 2024-10-09 (×4): qty 1

## 2024-10-09 MED ORDER — SODIUM CHLORIDE 0.9 % IV BOLUS
500.0000 mL | Freq: Once | INTRAVENOUS | Status: AC
  Administered 2024-10-09: 500 mL via INTRAVENOUS

## 2024-10-09 MED ORDER — ALBUTEROL SULFATE (2.5 MG/3ML) 0.083% IN NEBU
2.5000 mg | INHALATION_SOLUTION | RESPIRATORY_TRACT | Status: DC | PRN
  Administered 2024-10-10: 2.5 mg via RESPIRATORY_TRACT
  Filled 2024-10-09: qty 3

## 2024-10-09 MED ORDER — ARFORMOTEROL TARTRATE 15 MCG/2ML IN NEBU
15.0000 ug | INHALATION_SOLUTION | Freq: Two times a day (BID) | RESPIRATORY_TRACT | Status: DC
  Administered 2024-10-09 – 2024-10-12 (×6): 15 ug via RESPIRATORY_TRACT
  Filled 2024-10-09 (×6): qty 2

## 2024-10-09 MED ORDER — ACETAMINOPHEN 650 MG RE SUPP: 650.0000 mg | Freq: Four times a day (QID) | RECTAL | Status: DC | PRN

## 2024-10-09 MED ORDER — LACTATED RINGERS IV SOLN: INTRAVENOUS | Status: DC

## 2024-10-09 MED ORDER — ACETAMINOPHEN 325 MG PO TABS: 650.0000 mg | ORAL_TABLET | Freq: Four times a day (QID) | ORAL | Status: DC | PRN

## 2024-10-09 MED ORDER — REVEFENACIN 175 MCG/3ML IN SOLN
175.0000 ug | Freq: Every day | RESPIRATORY_TRACT | Status: DC
  Administered 2024-10-09 – 2024-10-12 (×4): 175 ug via RESPIRATORY_TRACT
  Filled 2024-10-09 (×4): qty 3

## 2024-10-09 MED ADMIN — Albuterol Sulfate Soln Nebu 0.083% (2.5 MG/3ML): 10 mg/h | RESPIRATORY_TRACT | NDC 00378827055

## 2024-10-09 MED FILL — Albuterol Sulfate Soln Nebu 0.083% (2.5 MG/3ML): 10.0000 mg/h | RESPIRATORY_TRACT | Qty: 12 | Status: AC

## 2024-10-09 NOTE — Progress Notes (Signed)
  Carryover admission to the Day Admitter.  I discussed this case with the EDP, Dr. Trine.  Per these discussions:   This is a 58 year old female with history of COPD, who is being admitted with acute COPD exacerbation after presenting with 4 to 5 days of progressive shortness of breath associated with cough, wheezing, after recent sick contact.  Specifically, she was caring for her grandson last week, who was diagnosed at that time with RSV infection.  In the setting of the above symptoms, the patient presented to urgent care on 10/08/2024, at which time she underwent rapid RSV testing, which was negative.  Additionally, her COVID and influenza testing at urgent care were also negative.  Chest x-ray at urgent care was reported to show no evidence of infiltrate, edema, effusion, or pneumothorax.  In the emergency department this evening, temperature max noted to be 100.5; respiratory rate 17 to 24, and O2 sat 95-97% on room air.  Labs in the ED were notable for the following: CBC showed white blood cell count of 7300.  Initial lactic acid 3.4, with repeat lactate trending down to 3.1.  Urinalysis has been ordered, with result currently pending.  Blood cultures x 2 were collected.  I have placed an order for observation for further evaluation and management of presenting acute COPD exacerbation.  I have placed some additional preliminary admit orders via the adult multi-morbid admission order set. I have also ordered Solu-Medrol , scheduled duo nebulizer treatments, prn albuterol  nebulizers and have added on procalcitonin level.  I have also ordered morning labs to include CMP, CBC, magnesium and phosphorus levels.  Regarding her mildly elevated lactate, there is suspicion for pharmacologic contribution from recent increase in use of beta-2 agonists. I ordered lactated Ringer 's at 75 cc/h x 10 hours, and also ordered repeat lactic acid level in the morning.    Eva Pore, DO Hospitalist

## 2024-10-09 NOTE — Plan of Care (Signed)
  Problem: Education: Goal: Knowledge of General Education information will improve Description: Including pain rating scale, medication(s)/side effects and non-pharmacologic comfort measures Outcome: Progressing   Problem: Clinical Measurements: Goal: Ability to maintain clinical measurements within normal limits will improve Outcome: Progressing   Problem: Nutrition: Goal: Adequate nutrition will be maintained Outcome: Progressing   Problem: Coping: Goal: Level of anxiety will decrease Outcome: Progressing   Problem: Elimination: Goal: Will not experience complications related to bowel motility Outcome: Progressing   Problem: Pain Managment: Goal: General experience of comfort will improve and/or be controlled Outcome: Progressing   Problem: Safety: Goal: Ability to remain free from injury will improve Outcome: Progressing

## 2024-10-09 NOTE — Plan of Care (Signed)
   Problem: Coping: Goal: Level of anxiety will decrease Outcome: Progressing   Problem: Pain Managment: Goal: General experience of comfort will improve and/or be controlled Outcome: Progressing   Problem: Safety: Goal: Ability to remain free from injury will improve Outcome: Progressing

## 2024-10-09 NOTE — ED Provider Notes (Signed)
 I assumed care of this patient from previous provider.  Please see their note for further details of history, exam, and MDM.   Briefly patient is a 58 y.o. female who presented cough and SOB. Recent RSV exposure, but rapid test at [facility] was negative. Also COVID/Flu negative. CXR at UC w/o PNA.  Patient has already received 1 round of continuous DuoNebs with Solu-Medrol  and magnesium.  Follow-up labs and reevaluate.    On reevaluation, patient is still wheezy with poor air movement.  Will require admission for continued management.      Trine Raynell Moder, MD 10/09/24 [phone]

## 2024-10-09 NOTE — H&P (Signed)
 History and Physical    Patient: Tracy Gray FMW:[id number] DOB: 1966-03-31 DOA: 10/08/2024 DOS: the patient was seen and examined on 10/09/2024 PCP: Henry Ingle, MD  Patient coming from: Home  Chief Complaint:  Chief Complaint  Patient presents with   Shortness of Breath   HPI: Tracy Gray is a 58 y.o. female with medical history significant of former tobacco use, COPD, sleep apnea, GERD, dyslipidemia and hypertension.  Patient presented to the ED after experiencing 1 week of respiratory symptoms with persistent rhinorrhea and cough and low-grade fevers.  She was taking care of her grandchild who was sick presumably with RSV.  She used her normal COPD medications including her nebulizers without any improvement in her symptoms.  Her primary symptom was dyspnea on exertion and dry cough which has worsened over the past week.  Her daughter is a Publishing rights manager and checks on her frequently by phone and FaceTime.  She noted that patient was having audible wheezing and recommended the patient present for medical evaluation.  The patient states she has a portable pulse oximetry device at home and the lowest reading in the past week was 81%.  She has not been having any lower extremity edema.  She has noticed over the past year that her ability to tolerate activity has decreased.  She reports a history of palpitations and apparently at an outside hospital underwent MRI of the head neck and chest where radiographic findings of CAD and carotid were noted.  She has been referred to a neurologist.  In addition patient has not been eating and drinking well over the past week and states she feels dehydrated her blood pressure has been running low at home so she had not been taking any of her medications for this.  She also is on chronic pain medications and because she was not breathing well and her blood pressure was low she did not take her normal pain medications.  In the ED her chest x-ray  was consistent with COPD but no infiltrate or other infectious process noted.  She was hemodynamically stable and not hypoxic at rest.  Lactic acid has been elevated at 3.4-3.1 and 3.4.  Procalcitonin was normal.  Respiratory viral panel for COVID, influenza and RSV negative.  Urinalysis unremarkable.  Blood cultures were obtained.  Patient was given a dose of Levaquin , IV Solu-Medrol  and magnesium by the ER.  Hospital service has been asked to evaluate the patient for admission.  Because of appearance of dehydration she was started on lactated Ringer 's infusion.   Review of Systems: As mentioned in the history of present illness. All other systems reviewed and are negative.   Past Medical History:  Diagnosis Date   Adenomatous colon polyp 03/26/2018   3 adenomas, max 10mm   Allergy     Anxiety    Asthma    Chronic headache    COPD (chronic obstructive pulmonary disease) (HCC)    Degenerative disc disease    Fibromyalgia    GERD (gastroesophageal reflux disease)    Hyperlipidemia    Hypertension    Multiple sclerosis    Nerve damage    OSA (obstructive sleep apnea)    not on CPAP    Sleep apnea    Past Surgical History:  Procedure Laterality Date   APPENDECTOMY     ARTHROCENTESIS INJECTION Left 06/14/2022   Procedure: LEFT ARTHROCENTESIS INJECTION;  Surgeon: Sheryle Hamilton, DMD;  Location: [facility] OR;  Service: Oral Surgery;  Laterality: Left;  jaw  ARTHROTOMY Right 06/14/2022   Procedure: RIGHT TEMPORAL MANDIBULAR JOINT ARTHROTOMY WITH MENISECTOMY AND FAT GRAFT;  Surgeon: Sheryle Hamilton, DMD;  Location: [facility] OR;  Service: Oral Surgery;  Laterality: Right;   COLONOSCOPY     DILATION AND CURETTAGE OF UTERUS     DIRECT LARYNGOSCOPY N/A 08/25/2021   Procedure: DIRECT LARYNGOSCOPY AND ESOPHAGOSCOPY, REMOVAL OF FOREIGN BODY;  Surgeon: Mable Lenis, MD;  Location: [facility] OR;  Service: ENT;  Laterality: N/A;   ECTOPIC PREGNANCY SURGERY     ESOPHAGEAL MANOMETRY N/A 09/07/2018   Procedure:  ESOPHAGEAL MANOMETRY (EM);  Surgeon: Shila Gustav GAILS, MD;  Location: [facility];  Service: Endoscopy;  Laterality: N/A;   ESOPHAGOGASTRODUODENOSCOPY     EXPLORATORY LAPAROTOMY     SINUS ENDO W/FUSION Bilateral 07/11/2021   Procedure: ENDOSCOPIC SINUS SURGERY WITH FUSION NAVIGATION;  Surgeon: Mable Lenis, MD;  Location: [facility];  Service: ENT;  Laterality: Bilateral;   SINUS IRRIGATION     TEMPOROMANDIBULAR JOINT SURGERY     TUBAL LIGATION     TURBINATE REDUCTION Bilateral 07/11/2021   Procedure: BILATERAL INFERIOR TURBINATE REDUCTION;  Surgeon: Mable Lenis, MD;  Location: [facility];  Service: ENT;  Laterality: Bilateral;   Social History:  reports that she quit smoking about 6 years ago. Her smoking use included cigarettes. She started smoking about 51 years ago. She has a 68.2 pack-year smoking history. She has never used smokeless tobacco. She reports that she does not drink alcohol and does not use drugs.  Allergies  Allergen Reactions   Bee Venom Anaphylaxis   Dog Epithelium Other (See Comments)    Other Reaction(s): Other (See Comments)  Reaction to Asthma throat closing   Penicillamine Anaphylaxis   Penicillins Anaphylaxis    Other Reaction(s): Swelling of throat   Protonix  [Pantoprazole  Sodium] Diarrhea    Family History  Problem Relation Age of Onset   Emphysema Mother        never smoked   Irritable bowel syndrome Mother    Hiatal hernia Mother    Other Mother        esophageal stricture   Emphysema Father        smoked   Prostate cancer Father    Breast cancer Paternal Aunt    Asthma Daughter    Asthma Son    Asthma Grandchild    Cirrhosis Maternal Uncle        alcohol related x 6 uncles   Esophageal cancer Neg Hx    Colon cancer Neg Hx    Pancreatic cancer Neg Hx    Stomach cancer Neg Hx     Prior to Admission medications   Medication Sig Start Date End Date Taking? Authorizing Provider  albuterol   (PROVENTIL ) (2.5 MG/3ML) 0.083% nebulizer solution Take 2.5 mg by nebulization every 4 (four) hours as needed for wheezing or shortness of breath. PLAN C    [provider]  albuterol  (VENTOLIN  HFA) 108 (90 Base) MCG/ACT inhaler Inhale 2 puffs into the lungs every 4 (four) hours as needed for wheezing or shortness of breath.    [provider]  amLODipine  (NORVASC ) 10 MG tablet Take 10 mg by mouth daily. 04/29/22   [provider]  Artificial Tear Ointment (DRY EYES OP) Place 1 drop into both eyes daily as needed (dry eyes).    [provider]  atorvastatin (LIPITOR) 10 MG tablet 1 tablet Orally Once a day    [provider]  BREO ELLIPTA  100-25 MCG/ACT AEPB 1 puff  daily. Patient not taking: Reported on 10/08/2024 04/25/24   [provider]  Budeson-Glycopyrrol-Formoterol  (BREZTRI  AEROSPHERE) 160-9-4.8 MCG/ACT AERO Inhale 2 puffs into the lungs in the morning and at bedtime. 01/03/24   Hope Almarie ORN, NP  budesonide -formoterol  (SYMBICORT ) 160-4.5 MCG/ACT inhaler 2 puffs Inhalation Twice a day    [provider]  clonazePAM  (KLONOPIN ) 1 MG tablet Take 2 mg by mouth at bedtime.    [provider]  cloNIDine (CATAPRES) 0.2 MG tablet Take 0.2 mg by mouth daily. Patient not taking: Reported on 10/08/2024 04/24/24   [provider]  cycloSPORINE (RESTASIS) 0.05 % ophthalmic emulsion 1  into affected eye Ophthalmic Twice a day    [provider]  EPINEPHrine  0.3 mg/0.3 mL IJ SOAJ injection Inject 0.3 mg into the muscle as needed for anaphylaxis.    [provider]  estradiol (ESTRACE) 0.5 MG tablet 1 tablet Orally Daily for Three Weeks, 1 Week off Patient not taking: Reported on 10/08/2024    [provider]  famotidine  (PEPCID ) 20 MG tablet TAKE 2 TABLETS BY MOUTH DAILY AT BEDTIME 10/09/22   Avram Lupita BRAVO, MD  fluticasone  [facility]) 50 MCG/ACT nasal spray Place 1 spray into both nostrils 3 (three)  times daily as needed for allergies. 07/24/21   [provider]  HYDROcodone -acetaminophen  (NORCO) 7.5-325 MG tablet Take 1 tablet by mouth in the morning, at noon, and at bedtime.    [provider]  ibuprofen  (ADVIL ) 200 MG tablet Take 200 mg by mouth every 6 (six) hours as needed.    [provider]  ipratropium (ATROVENT) 0.02 % nebulizer solution Take by nebulization 4 (four) times daily. 07/19/24   [provider]  ipratropium-albuterol  (DUONEB) 0.5-2.5 (3) MG/3ML SOLN Take 3 mLs by nebulization every 4 (four) hours as needed for up to 20 days. 09/24/23 10/08/24  Fairy Frames, MD  lisinopril-hydrochlorothiazide (ZESTORETIC) 10-12.5 MG tablet Take 1 tablet by mouth daily. 04/30/24   [provider]  medroxyPROGESTERone (PROVERA) 5 MG tablet 1 tablet Orally Once a day Patient not taking: Reported on 10/08/2024    [provider]  metroNIDAZOLE (METROCREAM) 0.75 % cream Apply 1 Application topically. Patient not taking: Reported on 10/08/2024    [provider]  mexiletine (MEXITIL) 150 MG capsule 1 capsule. Patient not taking: Reported on 10/08/2024    [provider]  montelukast (SINGULAIR) 10 MG tablet Take 10 mg by mouth at bedtime.    [provider]  Olopatadine HCl (PATADAY) 0.2 % SOLN 1 drop into affected eye Ophthalmic Once a day Patient not taking: Reported on 10/08/2024    [provider]  pantoprazole  (PROTONIX ) 40 MG tablet 1 tablet Orally Once a day    [provider]  PERMETHRIN EX Quinine Sulfate 300mg  po bid prn cramps Patient not taking: Reported on 10/08/2024 12/26/23   [provider]  predniSONE  (DELTASONE ) 20 MG tablet Take 40 Mg daily for 3 days then 20 Mg daily for 3 days then 10 Mg daily for 3 days then stop Patient not taking: Reported on 05/19/2024 09/25/23   Fairy Frames, MD  QUININE SULFATE PO Take 300 mg by mouth daily. Ordered from [place], pt taking 2  a  day Patient not taking: Reported on 10/08/2024    [provider]  Respiratory Therapy Supplies (FLUTTER) DEVI Use as directed 11/05/17   Darlean Ozell NOVAK, MD  rosuvastatin  (CRESTOR ) 20 MG tablet Take 20 mg by mouth daily. Patient not taking: Reported on 05/19/2024  [provider]  sodium chloride  (OCEAN) 0.65 % nasal spray Place 1 spray into both nostrils as needed (to keep moisture in nose). Patient not taking: Reported on 10/08/2024 03/05/18   [provider]    Physical Exam: Vitals:   10/09/24 0216 10/09/24 0345 10/09/24 0515 10/09/24 0607  BP:  97/65 (!) 101/48   Pulse:  92 89   Resp:  16 18   Temp:    98.3 F (36.8 C)  TempSrc:      SpO2: 97% 93% 96%    Constitutional: NAD, calm, comfortable Respiratory: clear but coarse to auscultation bilaterally, no wheezing, no crackles. Normal respiratory effort. No accessory muscle use.  Able to talk in complete sentences and no increased work of breathing or wheezing with repositioning on the stretcher. Cardiovascular: Regular rate and rhythm, no murmurs / rubs / gallops. No extremity edema. 2+ pedal pulses. Abdomen: no tenderness, no masses palpated. No hepatosplenomegaly. Bowel sounds positive.  Musculoskeletal: no clubbing / cyanosis. No joint deformity upper and lower extremities. Good ROM, no contractures. Normal muscle tone.  Skin: no rashes, lesions, ulcers. No induration Neurologic: CN 2-12 grossly intact. Sensation intact, Strength 5/5 x all 4 extremities.  Psychiatric: Normal judgment and insight. Alert and oriented x 3. Normal mood.     Data Reviewed:  Sodium 141, potassium 3.6, CO2 24, glucose 166, BUN 10, creatinine 0.91, anion gap 17  Lactic acid trend as above  WBC 7300 differential not obtained, hemoglobin 12.8, platelets 311,000  Blood cultures are pending  Assessment and Plan: Acute COPD exacerbation Possible underlying viral etiology-full 20 pathogen viral panel has been obtained  and results are pending Procalcitonin normal so no evidence of infectious etiology although given her underlying COPD and need for steroids prophylactic Zithromax  will be used-follow-up on blood cultures Stable on room air but continues to have dyspnea on exertion-obtain ambulatory pulse oximetry to rule out exertional hypoxemia There is a component of mild exercise intolerance/dyspnea on exertion which could be indicative of either evolving COPD versus development of pulmonary hypertension.  Will obtain echocardiogram Patient actually appears dry but for completeness of evaluation we will need to rule out underlying heart failure.  Will check BNP and echocardiogram Continue steroids Scheduled DuoNebs with as needed albuterol  for breakthrough symptoms  Hypertension Patient reports that for several months her blood pressures actually been running lower and has inconsistently taken her medication Current blood pressure readings are suboptimal but this could be solely related to dehydration noting lactic acid remains elevated therefore we will give maintenance fluids at 75 cc/h and give a one-time 500 cc normal saline bolus  Elevated anion gap acidosis Likely secondary to volume depletion-fluid replacement as above  Chronic pain Patient takes Norco at home for chronic back and shoulder pain Resume Norco  History of tobacco abuse States quit 6 years ago Other family and friends continue to come to the house and smoke in the house.  She tells me that since she has come to the hospital her daughter has gone to the house and put sons up all around the home for bidding smoking and her daughter even put up a deer cam on the back porch to catch people smoking  HLD Continue home Lipitor   Advance Care Planning:   Code Status: Full Code   VTE prophylaxis: Lovenox   Consults: None  Family Communication: Patient only  Severity of Illness: The appropriate patient status for this patient is  OBSERVATION. Observation status is judged to be reasonable and  necessary in order to provide the required intensity of service to ensure the patient's safety. The patient's presenting symptoms, physical exam findings, and initial radiographic and laboratory data in the context of their medical condition is felt to place them at decreased risk for further clinical deterioration. Furthermore, it is anticipated that the patient will be medically stable for discharge from the hospital within 2 midnights of admission.   Author: Isaiah Lever, NP 10/09/2024 6:35 AM  For on call review [email or web address].

## 2024-10-09 NOTE — Progress Notes (Signed)
 SATURATION QUALIFICATIONS:   Patient Saturations on Room Air at Rest = 97%, HR is 97.  Patient Saturations on Room Air while Ambulating = 94%, HR is 126.  Pt is SOB during and after ambulating and Pt complained of sweating, O2 sat after 2 mins of rest is 97% and HR is 114.  After of rest, HR is 105 and O2 sat is 98%.

## 2024-10-09 NOTE — Hospital Course (Addendum)
 Tracy Gray is a 57 y.o. female with PMH of COPD/eczema, former smoker, HLD, hypertension presenting with shortness of breath for few days initially with nasal congestion chest congestion and cough and recent sick contact.  Symptoms worsening and also with fevers at home, has been increasing her inhalers without improvement.  She was mildly hypertensive at the urgent care chest x-ray showed emphysematous changes without definitive pneumonia.  Sent to the ED for further evaluation. In the ED low-grade temperature 100.5, vitals otherwise stable with soft BP, on room air. Labs showed stable blood gas, lactic acidosis 3.4> 3.1 Pro-Cal negative normal CBC UA unremarkable COVID RSV influenza negative Admitted and managed with IV Solu-Medrol  bronchodilators and clinically improving At this time she still for discharge home  Subjective: Seen and examined Overall feeling better Not needing oxygen Overnight remains afebrile stable on room air.  She is very dyspneic with activity yesterday although not hypoxic  Discharge Diagnoses:   Acute exacerbation of COPD/Emphysema Rhinovirus positive: Admitted for COPD exacerbation in the setting of rhinovirus infection, xray/Wbc normal at risk of risk of respiratory compromise given her baseline lung issues  Overall clinically improving but slowly, on iV Solu-Medrol , Pulmicort  Brovana  yupleri, doxycycline  bronchodilators TTE done-showing normal EF 65 to 70%, G1 DD RV SF normal she has quit smoking but has a heavy exposure to secondhand smoking in the house-advised to stay away form 2nd had smoking. Cont flutter valve 2-4 times per day for mucus clearance, incentive spirometry.   Sepsis much improved today we will discharge her on oral prednisone  taper and oral antibiotics  Lactic acidosis Low-grade fever: UA chest x-ray unremarkable.Pro-Cal negative suspect in the setting of initial hypotension as well as COPD exacerbation.actic acidosis resolved   follow-up blood culture  NGTD.  Hypertension history Hypotension: Stable, cont to hold antihypertensive   HLD: Cont statin  Chronic pain Tobacco use Continue pain management, she has quit smoking but has second hand smoking  DVT prophylaxis: enoxaparin  (LOVENOX ) injection 40 mg Start: 10/09/24 1000 Code Status:   Code Status: Full Code Family Communication: plan of care discussed with patient  at bedside. Patient status is: Remains hospitalized as inpatient as inpatient because of severity of illness, with ongoing need for iv steroids bronchodilators given respiratory distress on ambulation Level of care: Med-Surg   Dispo: The patient is from: home            Anticipated disposition: TBD Objective: Vitals last 24 hrs: Vitals:   10/12/24 0642 10/12/24 0753 10/12/24 0757 10/12/24 1000  BP: (!) 160/86   132/72  Pulse: 81   72  Resp: 18   14  Temp: 98.3 F (36.8 C)   97.9 F (36.6 C)  TempSrc: Oral   Oral  SpO2: 98% 99% 99% 97%  Weight:      Height:        Physical Examination: General exam: alert awake, oriented pleasant HEENT:Oral mucosa moist, Ear/Nose WNL grossly Respiratory system: Bilaterally air entry present no wheezing Cardiovascular system: S1 & S2 +, No JVD. Gastrointestinal system: Abdomen soft,NT,ND, BS+ Nervous System: Alert, awake, moving all extremities,and following commands. Extremities: extremities warm, leg edema neg Skin: Warm, no rashes MSK: Normal muscle bulk,tone, power

## 2024-10-10 ENCOUNTER — Observation Stay (HOSPITAL_COMMUNITY)

## 2024-10-10 DIAGNOSIS — J441 Chronic obstructive pulmonary disease with (acute) exacerbation: Secondary | ICD-10-CM | POA: Diagnosis not present

## 2024-10-10 DIAGNOSIS — I270 Primary pulmonary hypertension: Secondary | ICD-10-CM | POA: Diagnosis not present

## 2024-10-10 LAB — ECHOCARDIOGRAM COMPLETE
AR max vel: 2.57 cm2
AV Area VTI: 2.79 cm2
AV Area mean vel: 2.59 cm2
AV Mean grad: 2.6 mmHg
AV Peak grad: 5.3 mmHg
Ao pk vel: 1.15 m/s
Area-P 1/2: 4.93 cm2
Height: 64 in
S' Lateral: 2.4 cm
Weight: 2543.23 [oz_av]

## 2024-10-10 LAB — BASIC METABOLIC PANEL WITH GFR
Anion gap: 8 (ref 5–15)
BUN: 13 mg/dL (ref 6–20)
CO2: 30 mmol/L (ref 22–32)
Calcium: 9.2 mg/dL (ref 8.9–10.3)
Chloride: 107 mmol/L (ref 98–111)
Creatinine, Ser: 0.86 mg/dL (ref 0.44–1.00)
GFR, Estimated: >60 mL/min (ref >60)
Glucose, Bld: 125 mg/dL — ABNORMAL HIGH (ref 70–99)
Potassium: 4.5 mmol/L (ref 3.5–5.1)
Sodium: 144 mmol/L (ref 135–145)

## 2024-10-10 MED ORDER — DOXYCYCLINE HYCLATE 100 MG PO TABS
100.0000 mg | ORAL_TABLET | Freq: Two times a day (BID) | ORAL | Status: DC
  Administered 2024-10-10 – 2024-10-12 (×5): 100 mg via ORAL
  Filled 2024-10-10 (×5): qty 1

## 2024-10-10 MED ORDER — ORAL CARE MOUTH RINSE: 15.0000 mL | OROMUCOSAL | Status: DC | PRN

## 2024-10-10 NOTE — TOC Initial Note (Signed)
 Transition of Care [facility]) - Initial/Assessment Note    Patient Details  Name: Tracy Gray MRN: [id number] Date of Birth: September 22, 1966  Transition of Care [facility]) CM/SW Contact:    Sonda Manuella Quill, RN Phone Number: 10/10/2024, 10:11 AM  Clinical Narrative:                 Spoke w/ pt in room; pt said she lives at home; she plans to return at d/c w/ support from her family; family will provide transportation; pt identified POC dtr [employer] [phone]); she denied SDOH risks; pt does not have DME, HH services, or home oxygen; IP CM will follow.  Expected Discharge Plan: Home/Self Care Barriers to Discharge: Continued Medical Work up   Patient Goals and CMS Choice Patient states their goals for this hospitalization and ongoing recovery are:: home CMS Medicare.gov Compare Post Acute Care list provided to:: Patient   [place] ownership interest in [facility].provided to:: Patient    Expected Discharge Plan and Services   Discharge Planning Services: CM Consult   Living arrangements for the past 2 months: Mobile Home                 DME Arranged: N/A DME Agency: NA       HH Arranged: NA HH Agency: NA        Prior Living Arrangements/Services Living arrangements for the past 2 months: Mobile Home Lives with:: Self Patient language and need for interpreter reviewed:: Yes Do you feel safe going back to the place where you live?: Yes      Need for Family Participation in Patient Care: Yes (Comment) Care giver support system in place?: Yes (comment) Current home services:  (n/a) Criminal Activity/Legal Involvement Pertinent to Current Situation/Hospitalization: No - Comment as needed  Activities of Daily Living   ADL Screening (condition at time of admission) Independently performs ADLs?: No Does the patient have a NEW difficulty with bathing/dressing/toileting/self-feeding that is expected to last >3 days?: No Does the patient have a NEW  difficulty with getting in/out of bed, walking, or climbing stairs that is expected to last >3 days?: No Does the patient have a NEW difficulty with communication that is expected to last >3 days?: No Is the patient deaf or have difficulty hearing?: No Does the patient have difficulty seeing, even when wearing glasses/contacts?: No Does the patient have difficulty concentrating, remembering, or making decisions?: No  Permission Sought/Granted Permission sought to share information with : Case Manager Permission granted to share information with : Yes, Verbal Permission Granted  Share Information with NAME: Case Manager     Permission granted to share info w Relationship: [employer] (dtr) [phone]     Emotional Assessment Appearance:: Appears stated age Attitude/Demeanor/Rapport: Gracious Affect (typically observed): Accepting Orientation: : Oriented to Self, Oriented to Place, Oriented to  Time, Oriented to Situation Alcohol / Substance Use: Not Applicable Psych Involvement: No (comment)  Admission diagnosis:  Acute exacerbation of chronic obstructive pulmonary disease (COPD) (HCC) [J44.1] COPD exacerbation (HCC) [J44.1] COPD with acute exacerbation (HCC) [J44.1] Patient Active Problem List   Diagnosis Date Noted   Acute exacerbation of chronic obstructive pulmonary disease (COPD) (HCC) 10/09/2024   COPD with acute exacerbation (HCC) 10/09/2024   Hiatal hernia 05/19/2024   Schatzki's ring 05/19/2024   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   History of colonic polyps 01/22/2024   Acute respiratory failure with hypoxia (HCC) 09/20/2023   Normocytic anemia 09/20/2023   Hypotension 09/19/2023   Former  cigarette  smoker 05/13/2022   Swallowed foreign body 08/25/2021   Eustachian tube dysfunction, left 04/03/2021   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   Bilateral temporomandibular joint pain 01/02/2021   Thrush 02/07/2020   Esophagogastric junction outflow  obstruction 12/08/2018   Dysphagia    Chronic back pain 08/21/2018   Hypercholesterolemia 08/21/2018   Pulmonary emphysema (HCC) 08/21/2018   Hypertensive disorder 08/21/2018   Chronic obstructive pulmonary disease (HCC) 08/21/2018   Gastroesophageal reflux disease without esophagitis 12/08/2017   Maxillary sinusitis, chronic 11/26/2016   COPD exacerbation (HCC) 07/16/2016   Obstructive sleep apnea 05/10/2016   Deviated nasal septum 03/22/2016   Nasal turbinate hypertrophy 03/22/2016   Seasonal allergic rhinitis 03/22/2016   COPD  GOLD III with min reversiblity  03/01/2016   Sinusitis, chronic 03/01/2016   Cigarette smoker 02/03/2016   Upper airway cough syndrome 02/02/2016   Dyspnea 02/02/2016   Muscle cramps 12/24/2013   PCP:  Henry Ingle, MD Pharmacy:   [facility] [address] [place] - [address] [place] [address] Phone: [phone] Fax: [phone]  [facility] - [place], [place] - [address] [place] [address] Phone: [phone] Fax: [phone]  [place] - [facility] [address] [place] [address] Phone: [phone] Fax: [phone]     Social Drivers of Health (SDOH) Social History: SDOH Screenings   Food Insecurity: No Food Insecurity (10/10/2024)  Housing: Low Risk  (10/10/2024)  Transportation Needs: No Transportation Needs (10/10/2024)  Utilities: Not At Risk (10/10/2024)  Social Connections: Moderately Integrated (10/09/2024)  Tobacco Use: Medium Risk (10/08/2024)   SDOH Interventions: Food Insecurity Interventions: Intervention Not Indicated, Inpatient TOC Housing Interventions: Intervention Not Indicated, Inpatient TOC Transportation Interventions: Intervention Not Indicated, Inpatient TOC Utilities Interventions: Intervention Not Indicated, Inpatient TOC   Readmission Risk Interventions     No data to display

## 2024-10-10 NOTE — Care Management Obs Status (Signed)
 MEDICARE OBSERVATION STATUS NOTIFICATION   Patient Details  Name: ADMIRE BUNNELL MRN: [id number] Date of Birth: 10-16-1966   Medicare Observation Status Notification Given:  Yes    Sonda Manuella Quill, RN 10/10/2024, 10:02 AM

## 2024-10-10 NOTE — Progress Notes (Signed)
 PROGRESS NOTE Tracy Gray  FMW:[id number] DOB: 1966/11/08 DOA: 10/08/2024 PCP: Henry Ingle, MD  Brief Narrative/Hospital Course: DAREN Gray is a 58 y.o. female with PMH of COPD/eczema, former smoker, HLD, hypertension presenting with shortness of breath for few days initially with nasal congestion chest congestion and cough and recent sick contact.  Symptoms worsening and also with fevers at home, has been increasing her inhalers without improvement.  She was mildly hypertensive at the urgent care chest x-ray showed emphysematous changes without definitive pneumonia.  Sent to the ED for further evaluation. In the ED low-grade temperature 100.5, vitals otherwise stable with soft BP, on room air. Labs showed stable blood gas, lactic acidosis 3.4> 3.1 Pro-Cal negative normal CBC UA unremarkable COVID RSV influenza negative  Subjective: Seen and examined today Complains of more cough this morning bee sting feeling tight and wheezing Intermittently short of breath with mobility Overnight-on room air, afebrile, VSS, Labs with lactic acidosis resolved  Assessment and plan:  Acute exacerbation of chronic obstructive pulmonary disease Emphysema Rhinovirus positive: Last PFT: 2019 TLC 5.56 L. Xray reviewed.Wbc normal, procalcitonin neg. treating for COPD/emphysema exacerbation in the setting of rhinovirus infection. Overall very slow to improve bilaterally diminished breath sound with ongoing wheezing Cont systemic steroids, bronchodilators AND doxycycline  TTE pending to evaluate her dyspnea  He has quit smoking but has a heavy exposure to secondhand smoking in the house-advised to stay away form 2nd had smoking. Cont flutter valve 2-4 times per day for mucus clearance, incentive spirometry.    Lactic acidosis Low-grade fever: UA chest x-ray unremarkable.  Pro-Cal negative suspect in the setting of initial hypotension as well as COPD exacerbation.  Afebrile and lactic acidosis  resolved  follow-up blood culture  Hypertension history Hypotension: Cont to hold antihypertensive   HLD: Resume statin  Chronic pain: Tobacco use   DVT prophylaxis: enoxaparin  (LOVENOX ) injection 40 mg Start: 10/09/24 1000 Code Status:   Code Status: Full Code Family Communication: plan of care discussed with patient  at bedside. Patient status is: Remains hospitalized because of severity of illness Level of care: Med-Surg   Dispo: The patient is from: home            Anticipated disposition: TBD 1-2 days Objective: Vitals last 24 hrs: Vitals:   10/09/24 1938 10/09/24 2136 10/10/24 0500 10/10/24 0624  BP:  (!) 142/60  135/77  Pulse:  90  82  Resp:  19  16  Temp:  97.9 F (36.6 C)  97.9 F (36.6 C)  TempSrc:  Oral  Oral  SpO2:  99%  94%  Weight: 69 kg  72.1 kg   Height: 5' 4 (1.626 m)       Physical Examination: General exam: alert awake, oriented, older than stated age HEENT:Oral mucosa moist, Ear/Nose WNL grossly Respiratory system: Bilaterally diminished breath sounds with wheezing mild use of accessory muscle Cardiovascular system: S1 & S2 +, No JVD. Gastrointestinal system: Abdomen soft,NT,ND, BS+ Nervous System: Alert, awake, moving all extremities,and following commands. Extremities: extremities warm, leg edema neg Skin: Warm, no rashes MSK: Normal muscle bulk,tone, power   Medications reviewed:  Scheduled Meds:  arformoterol   15 mcg Nebulization BID   And   revefenacin  175 mcg Nebulization Daily   And   budesonide  (PULMICORT ) nebulizer solution  0.5 mg Nebulization BID   atorvastatin  10 mg Oral Daily   clonazePAM   2 mg Oral QHS   enoxaparin  (LOVENOX ) injection  40 mg Subcutaneous Q24H   HYDROcodone -acetaminophen   1 tablet  Oral TID   methylPREDNISolone  (SOLU-MEDROL ) injection  80 mg Intravenous Q12H   Continuous Infusions:  azithromycin  (ZITHROMAX ) 500 mg in sodium chloride  0.9 % 250 mL IVPB Stopped (10/09/24 2233)   lactated ringers  125 mL/hr  at 10/10/24 9372   Diet: Diet Order             Diet regular Room service appropriate? Yes; Fluid consistency: Thin  Diet effective now                   Data Reviewed: I have personally reviewed following labs and imaging studies ( see epic result tab) CBC: Recent Labs  Lab 10/08/24 2308 10/09/24 0549  WBC 7.3 5.8  NEUTROABS  --  5.2  HGB 12.8 11.7*  HCT 41.4 38.3  MCV 95.0 97.5  PLT 311 293   CMP: Recent Labs  Lab 10/08/24 2308 10/09/24 1235 10/10/24 0327  NA 141 140 144  K 3.6 3.7 4.5  CL 100 102 107  CO2 24 18* 30  GLUCOSE 166* 164* 125*  BUN 10 11 13   CREATININE 0.91 0.81 0.86  CALCIUM  9.5 9.0 9.2  MG  --  2.3  --   PHOS  --  3.9  --    GFR: Estimated Creatinine Clearance: 68.6 mL/min (by C-G formula based on SCr of 0.86 mg/dL). Recent Labs  Lab 10/09/24 1235  AST 32  ALT 26  ALKPHOS 123  BILITOT <0.2  PROT 6.4*  ALBUMIN 3.8   No results for input(s): LIPASE, AMYLASE in the last 168 hours. No results for input(s): AMMONIA in the last 168 hours. Coagulation Profile: No results for input(s): INR, PROTIME in the last 168 hours. Unresulted Labs (From admission, onward)     Start     Ordered   10/16/24 0500  Creatinine, serum  (enoxaparin  (LOVENOX )    CrCl >/= 30 ml/min)  Weekly,   R     Comments: while on enoxaparin  therapy    10/09/24 0850           Antimicrobials/Microbiology: Anti-infectives (From admission, onward)    Start     Dose/Rate Route Frequency Ordered Stop   10/10/24 1200  doxycycline  (VIBRA -TABS) tablet 100 mg        100 mg Oral Every 12 hours 10/10/24 1112 10/15/24 0959   10/09/24 2200  azithromycin  (ZITHROMAX ) 500 mg in sodium chloride  0.9 % 250 mL IVPB        500 mg 250 mL/hr over 60 Minutes Intravenous Every 24 hours 10/09/24 0723     10/09/24 0015  levofloxacin  (LEVAQUIN ) IVPB 750 mg        750 mg 100 mL/hr over 90 Minutes Intravenous  Once 10/09/24 0000 10/09/24 0214         Component Value Date/Time    SDES  10/09/2024 0021    BLOOD LEFT ANTECUBITAL Performed at [facility], [address] [place], [place] [address]    SPECREQUEST  10/09/2024 0021    BOTTLES DRAWN AEROBIC AND ANAEROBIC Blood Culture results may not be optimal due to an inadequate volume of blood received in culture bottles Performed at [facility], [address], [place], [place] [address]    CULT  10/09/2024 0021    NO GROWTH 1 DAY Performed at [facility], [address] [place], [place] [address]    REPTSTATUS PENDING 10/09/2024 0021    Procedures:    Mennie LAMY, MD [facility] 10/10/2024, 11:13 AM

## 2024-10-11 DIAGNOSIS — B9789 Other viral agents as the cause of diseases classified elsewhere: Secondary | ICD-10-CM | POA: Diagnosis present

## 2024-10-11 DIAGNOSIS — K219 Gastro-esophageal reflux disease without esophagitis: Secondary | ICD-10-CM | POA: Diagnosis present

## 2024-10-11 DIAGNOSIS — E7800 Pure hypercholesterolemia, unspecified: Secondary | ICD-10-CM | POA: Diagnosis present

## 2024-10-11 DIAGNOSIS — I959 Hypotension, unspecified: Secondary | ICD-10-CM | POA: Diagnosis present

## 2024-10-11 DIAGNOSIS — Z79899 Other long term (current) drug therapy: Secondary | ICD-10-CM | POA: Diagnosis not present

## 2024-10-11 DIAGNOSIS — F419 Anxiety disorder, unspecified: Secondary | ICD-10-CM | POA: Diagnosis present

## 2024-10-11 DIAGNOSIS — Z87891 Personal history of nicotine dependence: Secondary | ICD-10-CM | POA: Diagnosis not present

## 2024-10-11 DIAGNOSIS — G35D Multiple sclerosis, unspecified: Secondary | ICD-10-CM | POA: Diagnosis present

## 2024-10-11 DIAGNOSIS — Z803 Family history of malignant neoplasm of breast: Secondary | ICD-10-CM | POA: Diagnosis not present

## 2024-10-11 DIAGNOSIS — G4733 Obstructive sleep apnea (adult) (pediatric): Secondary | ICD-10-CM | POA: Diagnosis present

## 2024-10-11 DIAGNOSIS — Z825 Family history of asthma and other chronic lower respiratory diseases: Secondary | ICD-10-CM | POA: Diagnosis not present

## 2024-10-11 DIAGNOSIS — E860 Dehydration: Secondary | ICD-10-CM | POA: Diagnosis present

## 2024-10-11 DIAGNOSIS — M797 Fibromyalgia: Secondary | ICD-10-CM | POA: Diagnosis present

## 2024-10-11 DIAGNOSIS — J441 Chronic obstructive pulmonary disease with (acute) exacerbation: Secondary | ICD-10-CM | POA: Diagnosis present

## 2024-10-11 DIAGNOSIS — Z860101 Personal history of adenomatous and serrated colon polyps: Secondary | ICD-10-CM | POA: Diagnosis not present

## 2024-10-11 DIAGNOSIS — Z7951 Long term (current) use of inhaled steroids: Secondary | ICD-10-CM | POA: Diagnosis not present

## 2024-10-11 DIAGNOSIS — E872 Acidosis, unspecified: Secondary | ICD-10-CM | POA: Diagnosis present

## 2024-10-11 DIAGNOSIS — I2510 Atherosclerotic heart disease of native coronary artery without angina pectoris: Secondary | ICD-10-CM | POA: Diagnosis present

## 2024-10-11 DIAGNOSIS — Z8042 Family history of malignant neoplasm of prostate: Secondary | ICD-10-CM | POA: Diagnosis not present

## 2024-10-11 DIAGNOSIS — Z880 Allergy status to penicillin: Secondary | ICD-10-CM | POA: Diagnosis not present

## 2024-10-11 DIAGNOSIS — J439 Emphysema, unspecified: Secondary | ICD-10-CM | POA: Diagnosis present

## 2024-10-11 DIAGNOSIS — Z91030 Bee allergy status: Secondary | ICD-10-CM | POA: Diagnosis not present

## 2024-10-11 DIAGNOSIS — G8929 Other chronic pain: Secondary | ICD-10-CM | POA: Diagnosis present

## 2024-10-11 DIAGNOSIS — I10 Essential (primary) hypertension: Secondary | ICD-10-CM | POA: Diagnosis present

## 2024-10-11 NOTE — Plan of Care (Signed)
   Problem: Coping: Goal: Level of anxiety will decrease Outcome: Progressing   Problem: Pain Managment: Goal: General experience of comfort will improve and/or be controlled Outcome: Progressing   Problem: Safety: Goal: Ability to remain free from injury will improve Outcome: Progressing

## 2024-10-11 NOTE — Progress Notes (Signed)
 Pts tele order expiring in 1 hour. RN secure chat provider if he wants to reorder it. Per provider tele is no longer needed.

## 2024-10-11 NOTE — Progress Notes (Signed)
 Pt is being transferred to a different floor due to bed placement needed for Ortho pts. Pt is upset that she is being moved feels like she is not going to be cared for wish she stayed home. Pt crying. RN and charge nurse at bedside consoling pt. Provider notified. RN explained to pt why she was being moved and that the level of care and care plan would remain the same.

## 2024-10-11 NOTE — Progress Notes (Signed)
 PROGRESS NOTE Tracy Gray  FMW:[id number] DOB: 12/13/1966 DOA: 10/08/2024 PCP: Henry Ingle, MD  Brief Narrative/Hospital Course: Tracy Gray is a 58 y.o. female with PMH of COPD/eczema, former smoker, HLD, hypertension presenting with shortness of breath for few days initially with nasal congestion chest congestion and cough and recent sick contact.  Symptoms worsening and also with fevers at home, has been increasing her inhalers without improvement.  She was mildly hypertensive at the urgent care chest x-ray showed emphysematous changes without definitive pneumonia.  Sent to the ED for further evaluation. In the ED low-grade temperature 100.5, vitals otherwise stable with soft BP, on room air. Labs showed stable blood gas, lactic acidosis 3.4> 3.1 Pro-Cal negative normal CBC UA unremarkable COVID RSV influenza negative Admitted and managed with IV Solu-Medrol  bronchodilators and clinically improved.  No need for oxygen. On discharge will do steroid taper  Subjective: Seen and examined Overall she is feeling better, doing well on room air At times some cough Overnight afebrile BP stable, needed O2 overnight  On ambulation patient was not hypoxic however very short of breath and feeling nervous and not ready for home  Assessment and plan:  Acute exacerbation of chronic obstructive pulmonary disease Emphysema Rhinovirus positive: Last PFT: 2019 TLC 5.56 L. Xray/Wbc normal, procalcitonin neg. Patient with rhinovirus infection -at risk of risk of respiratory compromise given her baseline lung issues  Managed with IV Solu-Medrol , Pulmicort  Brovana  a pleurae, doxycycline  bronchodilators -continue same TTE done-showing normal EF 65 to 70%, G1 DD RV SF normal she has quit smoking but has a heavy exposure to secondhand smoking in the house-advised to stay away form 2nd had smoking. Cont flutter valve 2-4 times per day for mucus clearance, incentive spirometry.   On ambulation very  short of breath and does not feel ready for discharge today  Lactic acidosis Low-grade fever: UA chest x-ray unremarkable.Pro-Cal negative suspect in the setting of initial hypotension as well as COPD exacerbation.actic acidosis resolved  follow-up blood culture  NGTD.  Hypertension history Hypotension: Cont to hold antihypertensive   HLD: cont statin  Chronic pain Tobacco use Continue pain management, she has quit smoking but has secondhand smoking  DVT prophylaxis: enoxaparin  (LOVENOX ) injection 40 mg Start: 10/09/24 1000 Code Status:   Code Status: Full Code Family Communication: plan of care discussed with patient  at bedside. Patient status is: Remains hospitalized as inpatient as inpatient because of severity of illness, WITH ONGOING NEED FOR IV steroids bronchodilators given respiratory distress on ambulation Level of care: Med-Surg   Dispo: The patient is from: home            Anticipated disposition: TBD Objective: Vitals last 24 hrs: Vitals:   10/11/24 0510 10/11/24 0833 10/11/24 0858 10/11/24 0900  BP: (!) 142/73 (!) 149/75    Pulse: 90 84    Resp: 18 17    Temp: 97.7 F (36.5 C) (!) 97.4 F (36.3 C)    TempSrc: Oral Oral    SpO2: 97% 98% 98% 97%  Weight:      Height:        Physical Examination: General exam: alert awake HEENT:Oral mucosa moist, Ear/Nose WNL grossly Respiratory system: Bilaterally  diminished w. Wheezing anteriorly, use of accessory muscle on ambulation Cardiovascular system: S1 & S2 +, No JVD. Gastrointestinal system: Abdomen soft,NT,ND, BS+ Nervous System: Alert, awake, moving all extremities,and following commands. Extremities: extremities warm, leg edema neg Skin: Warm, no rashes MSK: Normal muscle bulk,tone, power   Medications reviewed:  Scheduled  Meds:  arformoterol   15 mcg Nebulization BID   And   revefenacin  175 mcg Nebulization Daily   And   budesonide  (PULMICORT ) nebulizer solution  0.5 mg Nebulization BID    atorvastatin  10 mg Oral Daily   clonazePAM   2 mg Oral QHS   doxycycline   100 mg Oral Q12H   enoxaparin  (LOVENOX ) injection  40 mg Subcutaneous Q24H   HYDROcodone -acetaminophen   1 tablet Oral TID   methylPREDNISolone  (SOLU-MEDROL ) injection  80 mg Intravenous Q12H   Continuous Infusions:   Diet: Diet Order             Diet regular Room service appropriate? Yes; Fluid consistency: Thin  Diet effective now                   Data Reviewed: I have personally reviewed following labs and imaging studies ( see epic result tab) CBC: Recent Labs  Lab 10/08/24 2308 10/09/24 0549  WBC 7.3 5.8  NEUTROABS  --  5.2  HGB 12.8 11.7*  HCT 41.4 38.3  MCV 95.0 97.5  PLT 311 293   CMP: Recent Labs  Lab 10/08/24 2308 10/09/24 1235 10/10/24 0327  NA 141 140 144  K 3.6 3.7 4.5  CL 100 102 107  CO2 24 18* 30  GLUCOSE 166* 164* 125*  BUN 10 11 13   CREATININE 0.91 0.81 0.86  CALCIUM  9.5 9.0 9.2  MG  --  2.3  --   PHOS  --  3.9  --    GFR: Estimated Creatinine Clearance: 68.6 mL/min (by C-G formula based on SCr of 0.86 mg/dL). Recent Labs  Lab 10/09/24 1235  AST 32  ALT 26  ALKPHOS 123  BILITOT <0.2  PROT 6.4*  ALBUMIN 3.8   No results for input(s): LIPASE, AMYLASE in the last 168 hours. No results for input(s): AMMONIA in the last 168 hours. Coagulation Profile: No results for input(s): INR, PROTIME in the last 168 hours. Unresulted Labs (From admission, onward)     Start     Ordered   10/16/24 0500  Creatinine, serum  (enoxaparin  (LOVENOX )    CrCl >/= 30 ml/min)  Weekly,   R     Comments: while on enoxaparin  therapy    10/09/24 0850           Antimicrobials/Microbiology: Anti-infectives (From admission, onward)    Start     Dose/Rate Route Frequency Ordered Stop   10/10/24 1200  doxycycline  (VIBRA -TABS) tablet 100 mg        100 mg Oral Every 12 hours 10/10/24 1112 10/15/24 0959   10/09/24 2200  azithromycin  (ZITHROMAX ) 500 mg in sodium chloride  0.9 %  250 mL IVPB  Status:  Discontinued        500 mg 250 mL/hr over 60 Minutes Intravenous Every 24 hours 10/09/24 0723 10/10/24 1235   10/09/24 0015  levofloxacin  (LEVAQUIN ) IVPB 750 mg        750 mg 100 mL/hr over 90 Minutes Intravenous  Once 10/09/24 0000 10/09/24 0214         Component Value Date/Time   SDES  10/09/2024 0021    BLOOD LEFT ANTECUBITAL Performed at [facility], [address] [place], [place] [address]    SPECREQUEST  10/09/2024 0021    BOTTLES DRAWN AEROBIC AND ANAEROBIC Blood Culture results may not be optimal due to an inadequate volume of blood received in culture bottles Performed at [facility], [address] [place], [place] [address]  CULT  10/09/2024 0021    NO GROWTH 2 DAYS Performed at [facility], [address] [place], [place] [address]    REPTSTATUS PENDING 10/09/2024 0021    Procedures:    Mennie LAMY, MD [facility] 10/11/2024, 11:17 AM

## 2024-10-12 ENCOUNTER — Other Ambulatory Visit (HOSPITAL_COMMUNITY): Payer: Self-pay

## 2024-10-12 DIAGNOSIS — J441 Chronic obstructive pulmonary disease with (acute) exacerbation: Secondary | ICD-10-CM | POA: Diagnosis not present

## 2024-10-12 MED ORDER — PREDNISONE 10 MG PO TABS
ORAL_TABLET | ORAL | 0 refills | Status: DC
  Filled 2024-10-12: qty 30, 11d supply, fill #0

## 2024-10-12 MED ORDER — DOXYCYCLINE HYCLATE 100 MG PO TABS
100.0000 mg | ORAL_TABLET | Freq: Two times a day (BID) | ORAL | 0 refills | Status: AC
  Filled 2024-10-12: qty 8, 4d supply, fill #0

## 2024-10-12 NOTE — Progress Notes (Signed)
 Discharge instructions given to patient now questions at this time. Discharge medications delivered to patient a the bedside in a secure bag.

## 2024-10-12 NOTE — Discharge Summary (Signed)
 Physician Discharge Summary  Tracy Gray FMW:[id number] DOB: 1966/07/06 DOA: 10/08/2024  PCP: Henry Ingle, MD  Admit date: 10/08/2024 Discharge date: 10/12/2024 Recommendations for Outpatient Follow-up:  Follow up with PCP in 1 weeks-call for appointment Please obtain BMP/CBC in one week  Discharge Dispo: Home Discharge Condition: Stable Code Status:   Code Status: Full Code Diet recommendation:  Diet Order             Diet general           Diet regular Room service appropriate? Yes; Fluid consistency: Thin  Diet effective now                    Brief/Interim Summary: Tracy Gray is a 58 y.o. female with PMH of COPD/eczema, former smoker, HLD, hypertension presenting with shortness of breath for few days initially with nasal congestion chest congestion and cough and recent sick contact.  Symptoms worsening and also with fevers at home, has been increasing her inhalers without improvement.  She was mildly hypertensive at the urgent care chest x-ray showed emphysematous changes without definitive pneumonia.  Sent to the ED for further evaluation. In the ED low-grade temperature 100.5, vitals otherwise stable with soft BP, on room air. Labs showed stable blood gas, lactic acidosis 3.4> 3.1 Pro-Cal negative normal CBC UA unremarkable COVID RSV influenza negative Admitted and managed with IV Solu-Medrol  bronchodilators and clinically improving At this time she still for discharge home  Subjective: Seen and examined Overall feeling better Not needing oxygen Overnight remains afebrile stable on room air.  She is very dyspneic with activity yesterday although not hypoxic  Discharge Diagnoses:   Acute exacerbation of COPD/Emphysema Rhinovirus positive: Admitted for COPD exacerbation in the setting of rhinovirus infection, xray/Wbc normal at risk of risk of respiratory compromise given her baseline lung issues  Overall clinically improving but slowly, on iV  Solu-Medrol , Pulmicort  Brovana  yupleri, doxycycline  bronchodilators TTE done-showing normal EF 65 to 70%, G1 DD RV SF normal she has quit smoking but has a heavy exposure to secondhand smoking in the house-advised to stay away form 2nd had smoking. Cont flutter valve 2-4 times per day for mucus clearance, incentive spirometry.   Sepsis much improved today we will discharge her on oral prednisone  taper and oral antibiotics  Lactic acidosis Low-grade fever: UA chest x-ray unremarkable.Pro-Cal negative suspect in the setting of initial hypotension as well as COPD exacerbation.actic acidosis resolved  follow-up blood culture  NGTD.  Hypertension history Hypotension: Stable, cont to hold antihypertensive   HLD: Cont statin  Chronic pain Tobacco use Continue pain management, she has quit smoking but has second hand smoking  DVT prophylaxis: enoxaparin  (LOVENOX ) injection 40 mg Start: 10/09/24 1000 Code Status:   Code Status: Full Code Family Communication: plan of care discussed with patient  at bedside. Patient status is: Remains hospitalized as inpatient as inpatient because of severity of illness, with ongoing need for iv steroids bronchodilators given respiratory distress on ambulation Level of care: Med-Surg   Dispo: The patient is from: home            Anticipated disposition: TBD Objective: Vitals last 24 hrs: Vitals:   10/12/24 0642 10/12/24 0753 10/12/24 0757 10/12/24 1000  BP: (!) 160/86   132/72  Pulse: 81   72  Resp: 18   14  Temp: 98.3 F (36.8 C)   97.9 F (36.6 C)  TempSrc: Oral   Oral  SpO2: 98% 99% 99% 97%  Weight:  Height:        Physical Examination: General exam: alert awake, oriented pleasant HEENT:Oral mucosa moist, Ear/Nose WNL grossly Respiratory system: Bilaterally air entry present no wheezing Cardiovascular system: S1 & S2 +, No JVD. Gastrointestinal system: Abdomen soft,NT,ND, BS+ Nervous System: Alert, awake, moving all extremities,and  following commands. Extremities: extremities warm, leg edema neg Skin: Warm, no rashes MSK: Normal muscle bulk,tone, power      Consultation: See note.  Discharge Instructions  Discharge Instructions     Ambulatory Referral for Lung Cancer Scre   Complete by: As directed    Diet general   Complete by: As directed    Discharge instructions   Complete by: As directed    Please call call MD or return to ER for similar or worsening recurring problem that brought you to hospital or if any fever,nausea/vomiting,abdominal pain, uncontrolled pain, chest pain,  shortness of breath or any other alarming symptoms.  Avoid secondhand smoking, follow-up with pulmonary for lung cancer screening  Please follow-up your doctor as instructed in a week time and call the office for appointment.  Please avoid alcohol, smoking, or any other illicit substance and maintain healthy habits including taking your regular medications as prescribed.  You were cared for by a hospitalist during your hospital stay. If you have any questions about your discharge medications or the care you received while you were in the hospital after you are discharged, you can call the unit and ask to speak with the hospitalist on call if the hospitalist that took care of you is not available.  Once you are discharged, your primary care physician will handle any further medical issues. Please note that NO REFILLS for any discharge medications will be authorized once you are discharged, as it is imperative that you return to your primary care physician (or establish a relationship with a primary care physician if you do not have one) for your aftercare needs so that they can reassess your need for medications and monitor your lab values   Increase activity slowly   Complete by: As directed       Allergies as of 10/12/2024       Reactions   Bee Venom Anaphylaxis   Dog Epithelium Other (See Comments)   Other Reaction(s): Other  (See Comments) Reaction to Asthma throat closing   Penicillamine Anaphylaxis   Penicillins Anaphylaxis   Other Reaction(s): Swelling of throat   Protonix  [pantoprazole  Sodium] Diarrhea        Medication List     TAKE these medications    albuterol  108 (90 Base) MCG/ACT inhaler Commonly known as: VENTOLIN  HFA Inhale 2 puffs into the lungs every 4 (four) hours as needed for wheezing or shortness of breath.   albuterol  (2.5 MG/3ML) 0.083% nebulizer solution Commonly known as: PROVENTIL  Take 2.5 mg by nebulization every 4 (four) hours as needed for wheezing or shortness of breath. PLAN C   amLODipine  10 MG tablet Commonly known as: NORVASC  Take 10 mg by mouth daily.   aspirin EC 325 MG tablet Take 325 mg by mouth daily.   atorvastatin 10 MG tablet Commonly known as: LIPITOR 1 tablet Orally Once a day   Breo Ellipta  100-25 MCG/ACT Aepb Generic drug: fluticasone  furoate-vilanterol 1 puff daily.   Breztri  Aerosphere 160-9-4.8 MCG/ACT Aero inhaler Generic drug: budesonide -glycopyrrolate-formoterol  Inhale 2 puffs into the lungs in the morning and at bedtime.   clonazePAM  1 MG tablet Commonly known as: KLONOPIN  Take 2 mg by mouth at bedtime.  cloNIDine 0.2 MG tablet Commonly known as: CATAPRES Take 0.2 mg by mouth daily. What changed:  when to take this reasons to take this   doxycycline  100 MG tablet Commonly known as: VIBRA -TABS Take 1 tablet (100 mg total) by mouth every 12 (twelve) hours for 4 days.   DRY EYES OP Place 1 drop into both eyes daily as needed (dry eyes).   EPINEPHrine  0.3 mg/0.3 mL Soaj injection Commonly known as: EPI-PEN Inject 0.3 mg into the muscle as needed for anaphylaxis.   fluticasone  50 MCG/ACT nasal spray Commonly known as: FLONASE Place 1 spray into both nostrils 3 (three) times daily as needed for allergies.   HYDROcodone -acetaminophen  7.5-325 MG tablet Commonly known as: NORCO Take 1 tablet by mouth in the morning, at noon,  and at bedtime.   ibuprofen  200 MG tablet Commonly known as: ADVIL  Take 200 mg by mouth every 6 (six) hours as needed for mild pain (pain score 1-3) or moderate pain (pain score 4-6).   ipratropium 0.02 % nebulizer solution Commonly known as: ATROVENT Take 0.5 mg by nebulization every 4 (four) hours as needed for wheezing or shortness of breath.   ipratropium-albuterol  0.5-2.5 (3) MG/3ML Soln Commonly known as: DUONEB Take 3 mLs by nebulization every 4 (four) hours as needed for up to 20 days.   lisinopril-hydrochlorothiazide 10-12.5 MG tablet Commonly known as: ZESTORETIC Take 1 tablet by mouth daily.   montelukast 10 MG tablet Commonly known as: SINGULAIR Take 10 mg by mouth at bedtime.   predniSONE  10 MG tablet Commonly known as: DELTASONE  Take PO 4 tabs daily x 3 days,3 tabs daily x 3 days,2 tabs daily x 3 days,1 tab daily x 2 days then stop.   Protonix  40 MG tablet Generic drug: pantoprazole  1 tablet Orally Once a day   QUININE SULFATE PO Take 300 mg by mouth daily. Ordered from [place], pt taking 2  a day What changed:  when to take this additional instructions   rosuvastatin  20 MG tablet Commonly known as: CRESTOR  Take 20 mg by mouth daily. What changed: how much to take   Symbicort  160-4.5 MCG/ACT inhaler Generic drug: budesonide -formoterol  2 puffs Inhalation Twice a day        Follow-up Information     Henry Ingle, MD Follow up in 1 week(s).   Specialty: Internal Medicine Contact information: [address] [place] [address] [phone]                Allergies  Allergen Reactions   Bee Venom Anaphylaxis   Dog Epithelium Other (See Comments)    Other Reaction(s): Other (See Comments)  Reaction to Asthma throat closing   Penicillamine Anaphylaxis   Penicillins Anaphylaxis    Other Reaction(s): Swelling of throat   Protonix  [Pantoprazole  Sodium] Diarrhea    The results of significant diagnostics from this hospitalization  (including imaging, microbiology, ancillary and laboratory) are listed below for reference.    Microbiology: Recent Results (from the past 240 hours)  Blood culture (routine x 2)     Status: None (Preliminary result)   Collection Time: 10/09/24 12:07 AM   Specimen: BLOOD  Result Value Ref Range Status   Specimen Description   Final    BLOOD RIGHT ANTECUBITAL Performed at [facility], [address] [place], [place] [address]    Special Requests   Final    BOTTLES DRAWN AEROBIC AND ANAEROBIC Blood Culture results may not be optimal due to an inadequate volume of blood received in  culture bottles Performed at [facility], [address] [place], [place] [address]    Culture   Final    NO GROWTH 3 DAYS Performed at [facility], [address], [place], [place] [address]    Report Status PENDING  Incomplete  Blood culture (routine x 2)     Status: None (Preliminary result)   Collection Time: 10/09/24 12:21 AM   Specimen: BLOOD  Result Value Ref Range Status   Specimen Description   Final    BLOOD LEFT ANTECUBITAL Performed at [facility], [address] [place], [place] [address]    Special Requests   Final    BOTTLES DRAWN AEROBIC AND ANAEROBIC Blood Culture results may not be optimal due to an inadequate volume of blood received in culture bottles Performed at [facility], [address], [place], [place] [address]    Culture   Final    NO GROWTH 3 DAYS Performed at [facility], [address] [place], [place] [address]    Report Status PENDING  Incomplete  Respiratory (~20 pathogens) panel by PCR     Status: Abnormal   Collection Time: 10/09/24  8:24 AM   Specimen: Nasopharyngeal Swab; Respiratory  Result Value Ref Range Status   Adenovirus NOT DETECTED NOT DETECTED Final   Coronavirus 229E NOT DETECTED NOT DETECTED Final    Comment: (NOTE) The Coronavirus on the Respiratory  Panel, DOES NOT test for the novel  Coronavirus (2019 nCoV)    Coronavirus HKU1 NOT DETECTED NOT DETECTED Final   Coronavirus NL63 NOT DETECTED NOT DETECTED Final   Coronavirus OC43 NOT DETECTED NOT DETECTED Final   Metapneumovirus NOT DETECTED NOT DETECTED Final   Rhinovirus / Enterovirus DETECTED (A) NOT DETECTED Final   Influenza A NOT DETECTED NOT DETECTED Final   Influenza B NOT DETECTED NOT DETECTED Final   Parainfluenza Virus 1 NOT DETECTED NOT DETECTED Final   Parainfluenza Virus 2 NOT DETECTED NOT DETECTED Final   Parainfluenza Virus 3 NOT DETECTED NOT DETECTED Final   Parainfluenza Virus 4 NOT DETECTED NOT DETECTED Final   Respiratory Syncytial Virus NOT DETECTED NOT DETECTED Final   Bordetella pertussis NOT DETECTED NOT DETECTED Final   Bordetella Parapertussis NOT DETECTED NOT DETECTED Final   Chlamydophila pneumoniae NOT DETECTED NOT DETECTED Final   Mycoplasma pneumoniae NOT DETECTED NOT DETECTED Final    Comment: Performed at [facility], [address] [place], [place] [address]    Procedures/Studies: ECHOCARDIOGRAM COMPLETE Result Date: 10/10/2024    ECHOCARDIOGRAM REPORT   Patient Name:   Tracy Gray Date of Exam: 10/10/2024 Medical Rec #:  [id number]         Height:       64.0 in Accession #:    [id number]        Weight:       159.0 lb Date of Birth:  February 07, 1966         BSA:          1.774 m Patient Age:    58 years          BP:           135/77 mmHg Patient Gender: F                 HR:           67 bpm. Exam Location:  Inpatient Procedure: 2D Echo (Both Spectral and Color Flow Doppler were utilized  during            procedure). Indications:    Pulmonary Hypertension  History:        Patient has prior history of Echocardiogram examinations.  Sonographer:    Charmaine Gaskins Referring Phys: [id number] ALLISON L ELLIS  Sonographer Comments: Image acquisition challenging due to COPD. IMPRESSIONS  1. Left ventricular ejection fraction, by estimation, is 65 to 70%. The  left ventricle has normal function. The left ventricle has no regional wall motion abnormalities. Left ventricular diastolic parameters are consistent with Grade I diastolic dysfunction (impaired relaxation).  2. Right ventricular systolic function is normal. The right ventricular size is normal. Tricuspid regurgitation signal is inadequate for assessing PA pressure.  3. The mitral valve is normal in structure. No evidence of mitral valve regurgitation. No evidence of mitral stenosis.  4. The tricuspid valve is abnormal.  5. The aortic valve was not well visualized. Aortic valve regurgitation is not visualized. No aortic stenosis is present.  6. The inferior vena cava is normal in size with greater than 50% respiratory variability, suggesting right atrial pressure of 3 mmHg. FINDINGS  Left Ventricle: Left ventricular ejection fraction, by estimation, is 65 to 70%. The left ventricle has normal function. The left ventricle has no regional wall motion abnormalities. The left ventricular internal cavity size was normal in size. There is  no left ventricular hypertrophy. Left ventricular diastolic parameters are consistent with Grade I diastolic dysfunction (impaired relaxation). Normal left ventricular filling pressure. Right Ventricle: The right ventricular size is normal. Right vetricular wall thickness was not well visualized. Right ventricular systolic function is normal. Tricuspid regurgitation signal is inadequate for assessing PA pressure. Left Atrium: Left atrial size was normal in size. Right Atrium: Right atrial size was normal in size. Pericardium: There is no evidence of pericardial effusion. Mitral Valve: The mitral valve is normal in structure. No evidence of mitral valve regurgitation. No evidence of mitral valve stenosis. Tricuspid Valve: The tricuspid valve is abnormal. Tricuspid valve regurgitation is mild . No evidence of tricuspid stenosis. Aortic Valve: The aortic valve was not well visualized.  Aortic valve regurgitation is not visualized. No aortic stenosis is present. Aortic valve mean gradient measures 2.6 mmHg. Aortic valve peak gradient measures 5.3 mmHg. Aortic valve area, by VTI measures 2.79 cm. Pulmonic Valve: The pulmonic valve was not well visualized. Pulmonic valve regurgitation is not visualized. No evidence of pulmonic stenosis. Aorta: The aortic root was not well visualized. Venous: The inferior vena cava is normal in size with greater than 50% respiratory variability, suggesting right atrial pressure of 3 mmHg. IAS/Shunts: No atrial level shunt detected by color flow Doppler.  LEFT VENTRICLE PLAX 2D LVIDd:         4.10 cm   Diastology LVIDs:         2.40 cm   LV e' medial:    8.27 cm/s LV PW:         0.80 cm   LV E/e' medial:  11.8 LV IVS:        0.90 cm   LV e' lateral:   11.00 cm/s LVOT diam:     2.00 cm   LV E/e' lateral: 8.9 LV SV:         61 LV SV Index:   34 LVOT Area:     3.14 cm  RIGHT VENTRICLE RV Basal diam:  2.40 cm RV Mid diam:    1.90 cm RV S prime:     13.40 cm/s LEFT  ATRIUM             Index        RIGHT ATRIUM          Index LA diam:        3.20 cm 1.80 cm/m   RA Area:     8.97 cm LA Vol (A2C):   46.2 ml 26.04 ml/m  RA Volume:   16.10 ml 9.07 ml/m LA Vol (A4C):   34.4 ml 19.39 ml/m LA Biplane Vol: 39.8 ml 22.43 ml/m  AORTIC VALVE AV Area (Vmax):    2.57 cm AV Area (Vmean):   2.59 cm AV Area (VTI):     2.79 cm AV Vmax:           115.11 cm/s AV Vmean:          72.793 cm/s AV VTI:            0.219 m AV Peak Grad:      5.3 mmHg AV Mean Grad:      2.6 mmHg LVOT Vmax:         94.26 cm/s LVOT Vmean:        59.990 cm/s LVOT VTI:          0.195 m LVOT/AV VTI ratio: 0.89  AORTA Ao Asc diam: 2.70 cm MITRAL VALVE MV Area (PHT): 4.93 cm     SHUNTS MV Decel Time: 154 msec     Systemic VTI:  0.19 m MV E velocity: 97.70 cm/s   Systemic Diam: 2.00 cm MV A velocity: 115.00 cm/s MV E/A ratio:  0.85 Dorn Ross MD Electronically signed by Dorn Ross MD Signature Date/Time:  10/10/2024/11:57:00 AM    Final    DG Chest 2 View Result Date: 10/08/2024 EXAM: 2 VIEW(S) XRAY OF THE CHEST 10/08/2024 03:22:28 PM COMPARISON: Comparison 09/19/2023. CLINICAL HISTORY: cough, wheeze, sob - hx severe emphysema and COPD. Pt reports exposure to RSV from grandchild, nasal \\T \ chest congestion, productive cough (green sputum), and dyspnea x 1 week. Hx of COPD. Catching her breath talking during triage. Pulse ox: 94% in triage. FINDINGS: LUNGS AND PLEURA: Hyperinflation of the lungs is noted. Emphysematous disease is noted predominantly in the upper lobes. Minimal bibasilar scarring or subsegmental atelectasis is noted. No pulmonary edema. No pleural effusion. No pneumothorax. HEART AND MEDIASTINUM: No acute abnormality of the cardiac and mediastinal silhouettes. BONES AND SOFT TISSUES: No acute osseous abnormality. IMPRESSION: 1. 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 2. Minimal bibasilar scarring or subsegmental atelectasis. Electronically signed by: Lynwood Seip MD 10/08/2024 04:07 PM EDT RP Workstation: [id number]    Labs: BNP (last 3 results) No results for input(s): BNP in the last 8760 hours. Basic Metabolic Panel: Recent Labs  Lab 10/08/24 2308 10/09/24 1235 10/10/24 0327  NA 141 140 144  K 3.6 3.7 4.5  CL 100 102 107  CO2 24 18* 30  GLUCOSE 166* 164* 125*  BUN 10 11 13   CREATININE 0.91 0.81 0.86  CALCIUM  9.5 9.0 9.2  MG  --  2.3  --   PHOS  --  3.9  --    Liver Function Tests: Recent Labs  Lab 10/09/24 1235  AST 32  ALT 26  ALKPHOS 123  BILITOT <0.2  PROT 6.4*  ALBUMIN 3.8   No results for input(s): LIPASE, AMYLASE in the last 168 hours. No results for input(s): AMMONIA in the last 168 hours. CBC: Recent Labs  Lab 10/08/24 2308 10/09/24 0549  WBC  7.3 5.8  NEUTROABS  --  5.2  HGB 12.8 11.7*  HCT 41.4 38.3  MCV 95.0 97.5  PLT 311 293   CBG: No results for input(s): GLUCAP in the last 168  hours. Hgb A1c No results for input(s): HGBA1C in the last 72 hours. Anemia work up No results for input(s): VITAMINB12, FOLATE, FERRITIN, TIBC, IRON, RETICCTPCT in the last 72 hours. Cardiac Enzymes: No results for input(s): CKTOTAL, CKMB, CKMBINDEX, TROPONINI in the last 168 hours. BNP: Invalid input(s): POCBNP D-Dimer No results for input(s): DDIMER in the last 72 hours. Lipid Profile No results for input(s): CHOL, HDL, LDLCALC, TRIG, CHOLHDL, LDLDIRECT in the last 72 hours. Thyroid  function studies No results for input(s): TSH, T4TOTAL, T3FREE, THYROIDAB in the last 72 hours.  Invalid input(s): FREET3 Urinalysis    Component Value Date/Time   COLORURINE STRAW (A) 10/09/2024 0132   APPEARANCEUR CLEAR 10/09/2024 0132   LABSPEC 1.009 10/09/2024 0132   PHURINE 5.0 10/09/2024 0132   GLUCOSEU NEGATIVE 10/09/2024 0132   HGBUR NEGATIVE 10/09/2024 0132   BILIRUBINUR NEGATIVE 10/09/2024 0132   KETONESUR NEGATIVE 10/09/2024 0132   PROTEINUR NEGATIVE 10/09/2024 0132   NITRITE NEGATIVE 10/09/2024 0132   LEUKOCYTESUR NEGATIVE 10/09/2024 0132   Sepsis Labs Recent Labs  Lab 10/08/24 2308 10/09/24 0549  WBC 7.3 5.8   Microbiology Recent Results (from the past 240 hours)  Blood culture (routine x 2)     Status: None (Preliminary result)   Collection Time: 10/09/24 12:07 AM   Specimen: BLOOD  Result Value Ref Range Status   Specimen Description   Final    BLOOD RIGHT ANTECUBITAL Performed at [facility], [address] [place], [place] [address]    Special Requests   Final    BOTTLES DRAWN AEROBIC AND ANAEROBIC Blood Culture results may not be optimal due to an inadequate volume of blood received in culture bottles Performed at [facility], [address] [place], [place] [address]    Culture   Final    NO GROWTH 3 DAYS Performed at [facility], [address], [place], [place]  [address]    Report Status PENDING  Incomplete  Blood culture (routine x 2)     Status: None (Preliminary result)   Collection Time: 10/09/24 12:21 AM   Specimen: BLOOD  Result Value Ref Range Status   Specimen Description   Final    BLOOD LEFT ANTECUBITAL Performed at [facility], [address], [place], [place] [address]    Special Requests   Final    BOTTLES DRAWN AEROBIC AND ANAEROBIC Blood Culture results may not be optimal due to an inadequate volume of blood received in culture bottles Performed at [facility], [address], [place], [place] [address]    Culture   Final    NO GROWTH 3 DAYS Performed at [facility], [address] [place], [place] [address]    Report Status PENDING  Incomplete  Respiratory (~20 pathogens) panel by PCR     Status: Abnormal   Collection Time: 10/09/24  8:24 AM   Specimen: Nasopharyngeal Swab; Respiratory  Result Value Ref Range Status   Adenovirus NOT DETECTED NOT DETECTED Final   Coronavirus 229E NOT DETECTED NOT DETECTED Final    Comment: (NOTE) The Coronavirus on the Respiratory Panel, DOES NOT test for the novel  Coronavirus (2019 nCoV)    Coronavirus HKU1 NOT DETECTED NOT DETECTED Final   Coronavirus NL63 NOT  DETECTED NOT DETECTED Final   Coronavirus OC43 NOT DETECTED NOT DETECTED Final   Metapneumovirus NOT DETECTED NOT DETECTED Final   Rhinovirus / Enterovirus DETECTED (A) NOT DETECTED Final   Influenza A NOT DETECTED NOT DETECTED Final   Influenza B NOT DETECTED NOT DETECTED Final   Parainfluenza Virus 1 NOT DETECTED NOT DETECTED Final   Parainfluenza Virus 2 NOT DETECTED NOT DETECTED Final   Parainfluenza Virus 3 NOT DETECTED NOT DETECTED Final   Parainfluenza Virus 4 NOT DETECTED NOT DETECTED Final   Respiratory Syncytial Virus NOT DETECTED NOT DETECTED Final   Bordetella pertussis NOT DETECTED NOT DETECTED Final   Bordetella Parapertussis NOT DETECTED NOT DETECTED Final    Chlamydophila pneumoniae NOT DETECTED NOT DETECTED Final   Mycoplasma pneumoniae NOT DETECTED NOT DETECTED Final    Comment: Performed at [facility], [address] [place], [place] [address]   Time coordinating discharge: 25 minutes  SIGNED: Mennie LAMY, MD  [facility] 10/12/2024, 10:42 AM  If 7PM-7AM, please contact night-coverage [email or web address]

## 2024-10-14 LAB — CULTURE, BLOOD (ROUTINE X 2)
Culture: NO GROWTH
Culture: NO GROWTH

## 2024-10-21 ENCOUNTER — Ambulatory Visit: Admitting: Internal Medicine

## 2024-10-21 ENCOUNTER — Encounter: Payer: Self-pay | Admitting: Internal Medicine

## 2024-10-21 VITALS — BP 131/78 | HR 88 | Temp 98.1°F | Ht 64.0 in | Wt 143.0 lb

## 2024-10-21 DIAGNOSIS — I10 Essential (primary) hypertension: Secondary | ICD-10-CM

## 2024-10-21 DIAGNOSIS — Z87891 Personal history of nicotine dependence: Secondary | ICD-10-CM | POA: Diagnosis not present

## 2024-10-21 DIAGNOSIS — J449 Chronic obstructive pulmonary disease, unspecified: Secondary | ICD-10-CM

## 2024-10-21 MED ORDER — VALSARTAN-HYDROCHLOROTHIAZIDE 80-12.5 MG PO TABS: 1.0000 | ORAL_TABLET | Freq: Every day | ORAL | 11 refills | Status: AC

## 2024-10-21 MED ORDER — BREZTRI AEROSPHERE 160-9-4.8 MCG/ACT IN AERO: INHALATION_SPRAY | RESPIRATORY_TRACT | Status: AC

## 2024-10-21 NOTE — Patient Instructions (Addendum)
 Stop lisinorpil   Start valsartan 80-12.5 one daily in its place  Plan A = Automatic = Always=  Symbiocort 160 (or Breztri )  Take 2 puffs first thing in am and then another 2 puffs about 12 hours later.    Work on inhaler technique:  relax and gently blow all the way out then take a nice smooth full deep breath back in, triggering the inhaler at same time you start breathing in.  Hold breath in for at least  5 seconds if you can. Blow out symbicort   thru nose. Rinse and gargle with water when done.  If mouth or throat bother you at all,  try brushing teeth/gums/tongue with arm and hammer toothpaste/ make a slurry and gargle and spit out.     Plan B = Backup (to supplement plan A, not to replace it) Use your albuterol  inhaler(Proair )  as a rescue medication to be used if you can't catch your breath by resting or slowing your pace  or doing a relaxed purse lip breathing pattern.  - The less you use it, the better it will work when you need it. - Ok to use the inhaler up to 2 puffs  every 4 hours if you must but call for appointment if use goes up over your usual need - Don't leave home without it !!  (think of it like the spare tire or starter fluid for your car)   Plan C = Crisis (instead of Plan B but only if Plan B stops working) - only use your albuterol  nebulizer if you first try Plan B and it fails to help > ok to use the nebulizer up to every 4 hours but if start needing it regularly call for immediate appointment  See Dr Madison practice about your nasal obstruction   Call the lung cancer screening program directly @  [phone] to discuss scheduling your low dose CT scan as soon as possible so you stay up to date.   Please schedule a follow up office visit in 8 weeks, call sooner if needed - bring meds with you

## 2024-10-21 NOTE — Assessment & Plan Note (Signed)
 Quit smoking 2019 - referred for lung cancer screening 05/13/2022 and again 10/21/24   Low-dose CT lung cancer screening is recommended for patients who are 87-59 years of age with a 20+ pack-year history of smoking and who are currently smoking or quit <=15 years ago. No coughing up blood  No unintentional weight loss of > 15 pounds in the last 6 months - pt is eligible for scanning yearly until 2034 > given direct number to schedule

## 2024-10-21 NOTE — Progress Notes (Unsigned)
 Subjective:   Patient ID: Tracy Gray, female    DOB: Nov 25, 1966     MRN: [id number]  Brief patient profile:  58 yowf  MM  quit smoking 05/2018  sick  since September 2016 with ear pain, nasal congestion, cough and sob rx per Dr Renay Rummer with multiple abx and prednisone  but no better then to ER > still only  some better  referred to pulmonary clinic 02/02/2016 for copd eval by EDP  Proved to have GOLD III criteria 03/2016     History of Present Illness  02/02/2016 1st [facility] office visit/ Tracy Gray   Chief Complaint  Patient presents with   Pulmonary Consult    Referred by [facility] after ED visit on 12/17/16. Pt states that she was dxed with COPD years ago- c/o increased SOB and cough for the past few months.  Cough is prod with clear to green sputum. She is SOB with or without any exertion. She is using albuterol  4 x per day on average.   symptoms came on abruptly p virus in Sept 2016  and consistent of variable sob are worse after supper assoc with choking sensation / sore throat and overt HB and persistent Doe x car to house with groceries every time. Cough is worse in am's, minimally productive, only occ green now. rec   sinus ct > pos sinusitis > levaquin  x 10   Pantoprazole  (protonix ) 40 mg   Take  30-60 min before first meal of the day and Zantac   @  bedtime until return to office -  GERD  Add: did not go to lab/ ok to complete when returns   Sinus surgery 07/11/21   09/03/2021  acute ov/Tracy Gray re: copd gold 2    maint on stiolto and saba prn last dose 6 h prior to OV   Chief Complaint  Patient presents with   Acute Visit    She had sinus surgery 3 wks ago, and then surgery again to remove chicken bone from her throat 08/25/21.  She states she feels more SOB lately, esp when exposed to strong smells. She is using her albuterol  inhaler 3-5 x per day. She has been coughing up green sputum x 3 days.   Dyspnea: walk 50 ft then sob but better than prior to sinus  surgery  Cough:  worse than usual since sinus surgery / no abx > green x 3 days  Sleeping: almost having to sit straight up since sinus surgery or severe cough SABA use: saba hfa x 2 this am  02: none  Covid status:   vax x 3  Rec Levaquin  500 mg daily  x 7 days Prednisone  10 mg take  4 each am x 2 days,   2 each am x 2 days,  1 each am x 2 days and stop  Plan A = Automatic = Always=   stiolto 2 puffs each am  Work on inhaler technique:   Plan B = Backup (to supplement plan A, not to replace it) Only use your albuterol  inhaler as a rescue medication   Plan C = Crisis (instead of Plan B but only if Plan B stops working) - only use your albuterol  nebulizer if you first try Plan B  For cough >  delsym 2 tsp every 12 hours and supplement with norco one every 4 hours as needed and change Protonix =   double it to 40 mg  Take 30-60 min before first meal of the day and  pepcid  20 mg after supper       05/13/2022  f/u ov/Tracy Gray re: GOLD 2 copd    maint on stiolto / pulmicoort neb but new wheeze with pets plan to be gone on 05/18/22   Chief Complaint  Patient presents with   Follow-up    Increased SOB since had URI approx 2 months ago. She has had some wheezing- esp if around cats that her grandchildren brought into her home.    Dyspnea:  walking the mall x 2 hours s stopping Cough: with pet exp/ mucoid min vol Sleeping: able to lie down at 30 degrees now more comfortably SABA use: up to 5 x since pet exp, was rarely needing while on stiolto  02: no Rec Prednisone  10 mg take  4 each am x 2 days,   2 each am x 2 days,  1 each am x 2 days and stop  Plan A = Automatic = Always=   stop stiolto and budesonide  for now and try  Symbicort  160 Take 2 puffs first thing in am and then another 2 puffs about 12 hours later.  Plan B = Backup (to supplement plan A, not to replace it) Only use your albuterol  inhaler as a rescue medication  Plan C = Crisis (instead of Plan B but only if Plan B stops working) -  only use your albuterol  nebulizer if you first try Plan B    10/21/2024  post hops  f/u ov/Tracy Gray re: GOLD 2 copd     maint on  symbicort  160 /last pred one day prior to OV  / on ACEi x ? one year    with many more colds from grandchildren and very poorly tol / freq rx with prednisone  prior to this most recent flare  Chief Complaint  Patient presents with   Hospitalization Follow-up    Copd excerebration. Pts is still recovery.  Dyspnea: not back to mall [place]  Cough: much worse on ACEi / non-productive now day > noct  Sleeping: 30 degrees with lots of pillows  resp cc  SABA use: 4 x daily neb albuterol   02: none   Lung cancer screening :  referred back  to direct line    No obvious day to day or daytime variability or assoc excess/ purulent sputum or mucus plugs or hemoptysis or cp or chest tightness, subjective wheeze or overt sinus or hb symptoms.    Also denies any obvious fluctuation of symptoms with weather or environmental changes or other aggravating or alleviating factors except as outlined above   No unusual exposure hx or h/o childhood pna/ asthma or knowledge of premature birth.  Current Allergies, Complete Past Medical History, Past Surgical History, Family History, and Social History were reviewed in [employer] record.  ROS  The following are not active complaints unless bolded Hoarseness, sore throat, dysphagia, dental problems, itching, sneezing,  nasal congestion or discharge of excess mucus or purulent secretions, ear ache,   fever, chills, sweats, unintended wt loss or wt gain, classically pleuritic or exertional cp,  orthopnea pnd or arm/hand swelling  or leg swelling, presyncope, palpitations, abdominal pain, anorexia, nausea, vomiting, diarrhea  or change in bowel habits or change in bladder habits, change in stools or change in urine, dysuria, hematuria,  rash, arthralgias, visual complaints, headache, numbness, weakness or ataxia or  problems with walking or coordination,  change in mood or  memory.        Current Meds  Medication Sig  albuterol  (PROVENTIL ) (2.5 MG/3ML) 0.083% nebulizer solution Take 2.5 mg by nebulization every 4 (four) hours as needed for wheezing or shortness of breath. PLAN C   albuterol  (VENTOLIN  HFA) 108 (90 Base) MCG/ACT inhaler Inhale 2 puffs into the lungs every 4 (four) hours as needed for wheezing or shortness of breath.   amLODipine  (NORVASC ) 10 MG tablet Take 10 mg by mouth daily.   aspirin EC 325 MG tablet Take 325 mg by mouth daily.   atorvastatin (LIPITOR) 10 MG tablet 1 tablet Orally Once a day   clonazePAM  (KLONOPIN ) 1 MG tablet Take 2 mg by mouth at bedtime.   cloNIDine (CATAPRES) 0.2 MG tablet Take 0.2 mg by mouth daily.   EPINEPHrine  0.3 mg/0.3 mL IJ SOAJ injection Inject 0.3 mg into the muscle as needed for anaphylaxis.   fluticasone  (FLONASE) 50 MCG/ACT nasal spray Place 1 spray into both nostrils 3 (three) times daily as needed for allergies. (Patient taking differently: Place 1 spray into both nostrils 3 (three) times daily as needed for allergies. USING EVERYDAY)   HYDROcodone -acetaminophen  (NORCO) 7.5-325 MG tablet Take 1 tablet by mouth in the morning, at noon, and at bedtime.   ibuprofen  (ADVIL ) 200 MG tablet Take 200 mg by mouth every 6 (six) hours as needed for mild pain (pain score 1-3) or moderate pain (pain score 4-6).   ipratropium (ATROVENT) 0.02 % nebulizer solution Take 0.5 mg by nebulization every 4 (four) hours as needed for wheezing or shortness of breath.   ipratropium-albuterol  (DUONEB) 0.5-2.5 (3) MG/3ML SOLN Take 3 mLs by nebulization every 4 (four) hours as needed for up to 20 days.   lisinopril-hydrochlorothiazide (ZESTORETIC) 10-12.5 MG tablet Take 1 tablet by mouth daily.   montelukast (SINGULAIR) 10 MG tablet Take 10 mg by mouth at bedtime.   pantoprazole  (PROTONIX ) 40 MG tablet 1 tablet Orally Once a day   predniSONE  (DELTASONE ) 10 MG tablet Take by mouth 4  tabs daily x 3 days,3 tabs daily x 3 days,2 tabs daily x 3 days,1 tab daily x 2 days then stop.   QUININE SULFATE PO Take 300 mg by mouth daily. Ordered from [place], pt taking 2  a day   rosuvastatin  (CRESTOR ) 20 MG tablet Take 20 mg by mouth daily.             Objective:   Physical Exam  Wts  10/21/2024       143   05/13/2022         161 09/03/2021        159  04/17/2021        151  07/21/2019        160  02/03/2019        163  08/03/2018        163  05/01/2018       167  03/20/2018        168  11/05/2017     167  07/15/2016       152   04/15/2016      159   03/01/16 157 lb (71.215 kg)  02/02/16 157 lb (71.215 kg)  10/09/12 142 lb (64.411 kg)     Vital signs reviewed  10/21/2024  - Note at rest 02 sats  95% on RA    General appearance:    amb wf raspy voice     HEENT : Oropharynx  clear   Nasal turbinates nl    NECK :  without  apparent JVD/ palpable Nodes/TM    LUNGS:  no acc muscle use,  Min barrel  contour chest wall with bilateral  slightly decreased bs s audible wheeze and  without cough on insp or exp maneuvers and min  Hyperresonant  to  percussion bilaterally    CV:  RRR  no s3 or murmur or increase in P2, and no edema   ABD:  soft and nontender    MS:  Nl gait/ ext warm without deformities Or obvious joint restrictions  calf tenderness, cyanosis or clubbing     SKIN: warm and dry without lesions    NEURO:  alert, approp, nl sensorium with  no motor or cerebellar deficits apparent.          Assessment & Plan:   Assessment & Plan COPD  GOLD III with min reversiblity  Quit smoking  Sept 2016  - PFT's  04/11/2016  FEV1 1.32 (47 % ) ratio 60  p 13 % improvement from saba p advair prior to study with DLCO  42/42 % corrects to 66 % for alv volume   - 11/05/2017   change advair to symbicort  160 2bid  - 03/20/2018   try symb 80 2bid due to prominent cough with choking (see uacs) PFT's  05/01/2018  FEV1 1.12 (41 % ) ratio 58  p 0 % improvement from saba p nothing  prior to study with DLCO  42 % corrects to 64  % for alv volume    - alpha one AT screen 05/01/2018 ;  MM  level 171  02/07/2020 - Trial changing Symbicort  to Stiolto + budesonide  nebulizer Insurance not covering Sym and patient does not feel it is working and reports thrush.  02/21/2020 Patient reports 100% improvement. Continue Stiolto and Budesonide  nebulizer BID  - 09/03/2021 flare off budesonide  in setting of sinus surgery then bone aspiration 08/24/21 > rx pred x 6 continue stiolto but not using budesonide  so leave off as most of the problem is upper airway in nature  - - The proper method of use, as well as anticipated side effects, of a metered-dose inhaler(mdi and smi) were discussed and demonstrated to the patient using teach back method. Improved effectiveness after extensive coaching during this visit to a level of approximately 75 % from a baseline of 50%   - 05/13/2022  After extensive coaching inhaler device,  effectiveness =    90% > changed to symbicort  160   for flare with cat exp - 10/21/2024 seen post admit for viral uri > d/c ACEi, continue breztri  2bid and approp saba    DDX of  difficult airways management almost all start with A and  include Adherence, Ace Inhibitors, Acid Reflux, Active Sinus Disease, Alpha 1 Antitripsin deficiency, Anxiety masquerading as Airways dz,  ABPA,  Allergy (esp in young), Aspiration (esp in elderly), Adverse effects of meds,  Active smoking or vaping, A bunch of PE's (a small clot burden can't cause this syndrome unless there is already severe underlying pulm or vascular dz with poor reserve) plus two Bs  = Bronchiectasis and Beta blocker use..and one C= CHF    Adherence is always the initial prime suspect and is a multilayered concern that requires a trust but verify approach in every patient - starting with knowing how to use medications, especially inhalers, correctly, keeping up with refills and understanding the fundamental difference between  maintenance and prns vs those medications only taken for a very short course and then stopped and not refilled.    ACEi adverse effects at the  top  of the usual list of suspects and the only way to rule it out is a trial off > see a/p      Group E in terms of symptoms/risk so  laba/lama/ICS  therefore appropriate rx at this point >>>  breztri   and approp more approp SABA prn.   Re SABA :  I spent extra time with pt today reviewing appropriate use of albuterol  for prn use on exertion with the following points: 1) saba is for relief of sob that does not improve by walking a slower pace or resting but rather if the pt does not improve after trying this first. 2) If the pt is convinced, as many are, that saba helps recover from activity faster then it's easy to tell if this is the case by re-challenging : ie stop, take the inhaler, then p 5 minutes try the exact same activity (intensity of workload) that just caused the symptoms and see if they are substantially diminished or not after saba 3) if there is an activity that reproducibly causes the symptoms, try the saba 15 min before the activity on alternate days    If in fact the saba really does help, then fine to continue to use it prn but advised may need to look closer at the maintenance regimen (breztri  for now)  being used to achieve better control of airways disease with exertion.    Former cigarette  smoker Quit smoking 2019 - referred for lung cancer screening 05/13/2022 and again 10/21/24   Low-dose CT lung cancer screening is recommended for patients who are 46-45 years of age with a 20+ pack-year history of smoking and who are currently smoking or quit <=15 years ago. No coughing up blood  No unintentional weight loss of > 15 pounds in the last 6 months - pt is eligible for scanning yearly until 2034  >>> given direct number to schedule   Essential hypertension Changed lisinopril/ hydrochlorothiazide  10/12.5 to  valsartan 80-12.5  one daily 10/21/2024    Comment  In the best review of chronic cough to date ( NEJM 2016 375 8455-8448) ,  ACEi are now felt to cause cough in up to  20% of pts which is a 4 fold increase from previous reports and does not include the variety of non-specific complaints we see in pulmonary clinic in pts on ACEi but previously attributed to another dx like  Copd/asthma and  include PNDS, throat and chest congestion, bad bronchitis, unexplained dyspnea and noct strangling sensations, and hoarseness, but also  atypical /refractory GERD symptoms like dysphagia and bad heartburn   The only way I know  to prove this is not an ACEi Case is a trial off ACEi x a minimum of 6 weeks then regroup.          Each maintenance medication was reviewed in detail including emphasizing most importantly the difference between maintenance and prns and under what circumstances the prns are to be triggered using an action plan format where appropriate.  Total time for H and P, chart review, counseling, reviewing hfa  device(s) and generating customized AVS unique to this office visit / same day charting = 43 min   for multiple  refractory respiratory  symptoms of uncertain etiology not seen in over 2 years with increased freq of aecopd          AVS  Patient Instructions  Stop lisinorpil   Start valsartan 80-12.5 one daily in its place  Plan A = Automatic =  Always=  Symbiocort 160 (or Breztri )  Take 2 puffs first thing in am and then another 2 puffs about 12 hours later.    Work on inhaler technique:  relax and gently blow all the way out then take a nice smooth full deep breath back in, triggering the inhaler at same time you start breathing in.  Hold breath in for at least  5 seconds if you can. Blow out symbicort   thru nose. Rinse and gargle with water when done.  If mouth or throat bother you at all,  try brushing teeth/gums/tongue with arm and hammer toothpaste/ make a slurry and gargle and spit out.      Plan B = Backup (to supplement plan A, not to replace it) Use your albuterol  inhaler(Proair )  as a rescue medication to be used if you can't catch your breath by resting or slowing your pace  or doing a relaxed purse lip breathing pattern.  - The less you use it, the better it will work when you need it. - Ok to use the inhaler up to 2 puffs  every 4 hours if you must but call for appointment if use goes up over your usual need - Don't leave home without it !!  (think of it like the spare tire or starter fluid for your car)   Plan C = Crisis (instead of Plan B but only if Plan B stops working) - only use your albuterol  nebulizer if you first try Plan B and it fails to help > ok to use the nebulizer up to every 4 hours but if start needing it regularly call for immediate appointment  See Dr Madison practice about your nasal obstruction   Call the lung cancer screening program directly @  [phone] to discuss scheduling your low dose CT scan as soon as possible so you stay up to date.   Please schedule a follow up office visit in 8 weeks, call sooner if needed - bring meds with you        Ozell America, MD 10/22/2024

## 2024-10-21 NOTE — Assessment & Plan Note (Signed)
 Changed lisinopril/ hydrochlorothiazide  10/12.5 to  valsartan 80-12.5 one daily 10/21/2024   In the best review of chronic cough to date ( NEJM 2016 375 8455-8448) ,  ACEi are now felt to cause cough in up to  20% of pts which is a 4 fold increase from previous reports and does not include the variety of non-specific complaints we see in pulmonary clinic in pts on ACEi but previously attributed to another dx like  Copd/asthma and  include PNDS, throat and chest congestion, bad bronchitis, unexplained dyspnea and noct strangling sensations, and hoarseness, but also  atypical /refractory GERD symptoms like dysphagia and bad heartburn   The only way I know  to prove this is not an ACEi Case is a trial off ACEi x a minimum of 6 weeks then regroup.

## 2024-10-22 NOTE — Assessment & Plan Note (Addendum)
 Quit smoking  Sept 2016  - PFT's  04/11/2016  FEV1 1.32 (47 % ) ratio 60  p 13 % improvement from saba p advair prior to study with DLCO  42/42 % corrects to 66 % for alv volume   - 11/05/2017   change advair to symbicort  160 2bid  - 03/20/2018   try symb 80 2bid due to prominent cough with choking (see uacs) PFT's  05/01/2018  FEV1 1.12 (41 % ) ratio 58  p 0 % improvement from saba p nothing prior to study with DLCO  42 % corrects to 64  % for alv volume    - alpha one AT screen 05/01/2018 ;  MM  level 171  02/07/2020 - Trial changing Symbicort  to Stiolto + budesonide  nebulizer Insurance not covering Sym and patient does not feel it is working and reports thrush.  02/21/2020 Patient reports 100% improvement. Continue Stiolto and Budesonide  nebulizer BID  - 09/03/2021 flare off budesonide  in setting of sinus surgery then bone aspiration 08/24/21 > rx pred x 6 continue stiolto but not using budesonide  so leave off as most of the problem is upper airway in nature  - - The proper method of use, as well as anticipated side effects, of a metered-dose inhaler(mdi and smi) were discussed and demonstrated to the patient using teach back method. Improved effectiveness after extensive coaching during this visit to a level of approximately 75 % from a baseline of 50%   - 05/13/2022  After extensive coaching inhaler device,  effectiveness =    90% > changed to symbicort  160   for flare with cat exp - 10/21/2024 seen post admit for viral uri > d/c ACEi, continue breztri  2bid and approp saba    DDX of  difficult airways management almost all start with A and  include Adherence, Ace Inhibitors, Acid Reflux, Active Sinus Disease, Alpha 1 Antitripsin deficiency, Anxiety masquerading as Airways dz,  ABPA,  Allergy (esp in young), Aspiration (esp in elderly), Adverse effects of meds,  Active smoking or vaping, A bunch of PE's (a small clot burden can't cause this syndrome unless there is already severe underlying pulm or vascular dz  with poor reserve) plus two Bs  = Bronchiectasis and Beta blocker use..and one C= CHF    Adherence is always the initial prime suspect and is a multilayered concern that requires a trust but verify approach in every patient - starting with knowing how to use medications, especially inhalers, correctly, keeping up with refills and understanding the fundamental difference between maintenance and prns vs those medications only taken for a very short course and then stopped and not refilled.    ACEi adverse effects at the  top of the usual list of suspects and the only way to rule it out is a trial off > see a/p      Group E in terms of symptoms/risk so  laba/lama/ICS  therefore appropriate rx at this point >>>  breztri   and approp more approp SABA prn.   Re SABA :  I spent extra time with pt today reviewing appropriate use of albuterol  for prn use on exertion with the following points: 1) saba is for relief of sob that does not improve by walking a slower pace or resting but rather if the pt does not improve after trying this first. 2) If the pt is convinced, as many are, that saba helps recover from activity faster then it's easy to tell if this is the case by re-challenging :  ie stop, take the inhaler, then p 5 minutes try the exact same activity (intensity of workload) that just caused the symptoms and see if they are substantially diminished or not after saba 3) if there is an activity that reproducibly causes the symptoms, try the saba 15 min before the activity on alternate days    If in fact the saba really does help, then fine to continue to use it prn but advised may need to look closer at the maintenance regimen (breztri  for now)  being used to achieve better control of airways disease with exertion.

## 2024-12-14 ENCOUNTER — Ambulatory Visit: Admitting: Internal Medicine

## 2024-12-14 NOTE — Progress Notes (Deleted)
 "  Subjective:   Patient ID: Tracy Gray, female    DOB: 1966/10/06     MRN: [id number]  Brief patient profile:  58 yowf  MM  quit smoking 05/2018  sick  since September 2016 with ear pain, nasal congestion, cough and sob rx per Dr Renay Rummer with multiple abx and prednisone  but no better then to ER > still only  some better  referred to pulmonary clinic 02/02/2016 for copd eval by EDP  Proved to have GOLD III criteria 03/2016     History of Present Illness  02/02/2016 1st [facility] office visit/ [facility]   Chief Complaint  Patient presents with   Pulmonary Consult    Referred by [facility] after ED visit on 12/17/16. Pt states that she was dxed with COPD years ago- c/o increased SOB and cough for the past few months.  Cough is prod with clear to green sputum. She is SOB with or without any exertion. She is using albuterol  4 x per day on average.   symptoms came on abruptly p virus in Sept 2016  and consistent of variable sob are worse after supper assoc with choking sensation / sore throat and overt HB and persistent Doe x car to house with groceries every time. Cough is worse in am's, minimally productive, only occ green now. rec   sinus ct > pos sinusitis > levaquin  x 10   Pantoprazole  (protonix ) 40 mg   Take  30-60 min before first meal of the day and Zantac   @  bedtime until return to office -  GERD  Add: did not go to lab/ ok to complete when returns   Sinus surgery 07/11/21   09/03/2021  acute ov/Rishan Oyama re: copd gold 2    maint on stiolto and saba prn last dose 6 h prior to OV   Chief Complaint  Patient presents with   Acute Visit    She had sinus surgery 3 wks ago, and then surgery again to remove chicken bone from her throat 08/25/21.  She states she feels more SOB lately, esp when exposed to strong smells. She is using her albuterol  inhaler 3-5 x per day. She has been coughing up green sputum x 3 days.   Dyspnea: walk 50 ft then sob but better than prior to sinus  surgery  Cough:  worse than usual since sinus surgery / no abx > green x 3 days  Sleeping: almost having to sit straight up since sinus surgery or severe cough SABA use: saba hfa x 2 this am  02: none  Covid status:   vax x 3  Rec Levaquin  500 mg daily  x 7 days Prednisone  10 mg take  4 each am x 2 days,   2 each am x 2 days,  1 each am x 2 days and stop  Plan A = Automatic = Always=   stiolto 2 puffs each am  Work on inhaler technique:   Plan B = Backup (to supplement plan A, not to replace it) Only use your albuterol  inhaler as a rescue medication   Plan C = Crisis (instead of Plan B but only if Plan B stops working) - only use your albuterol  nebulizer if you first try Plan B  For cough >  delsym 2 tsp every 12 hours and supplement with norco one every 4 hours as needed and change Protonix =   double it to 40 mg  Take 30-60 min before first meal of the day  and pepcid  20 mg after supper       05/13/2022  f/u ov/Noell Shular re: GOLD 2 copd    maint on stiolto / pulmicoort neb but new wheeze with pets plan to be gone on 05/18/22   Chief Complaint  Patient presents with   Follow-up    Increased SOB since had URI approx 2 months ago. She has had some wheezing- esp if around cats that her grandchildren brought into her home.    Dyspnea:  walking the mall x 2 hours s stopping Cough: with pet exp/ mucoid min vol Sleeping: able to lie down at 30 degrees now more comfortably SABA use: up to 5 x since pet exp, was rarely needing while on stiolto  02: no Rec Prednisone  10 mg take  4 each am x 2 days,   2 each am x 2 days,  1 each am x 2 days and stop  Plan A = Automatic = Always=   stop stiolto and budesonide  for now and try  Symbicort  160 Take 2 puffs first thing in am and then another 2 puffs about 12 hours later.  Plan B = Backup (to supplement plan A, not to replace it) Only use your albuterol  inhaler as a rescue medication  Plan C = Crisis (instead of Plan B but only if Plan B stops working) -  only use your albuterol  nebulizer if you first try Plan B    10/21/2024  post hops  f/u ov/Wynton Hufstetler re: GOLD 2 copd     maint on  symbicort  160 /last pred one day prior to OV  / on ACEi x ? one year    with many more colds from grandchildren and very poorly tol / freq rx with prednisone  prior to this most recent flare  Chief Complaint  Patient presents with   Hospitalization Follow-up    Copd excerebration. Pts is still recovery.  Dyspnea: not back to mall [place]  Cough: much worse on ACEi / non-productive now day > noct  Sleeping: 30 degrees with lots of pillows  resp cc  SABA use: 4 x daily neb albuterol   02: none  Lung cancer screening :  referred back  to direct line  Patient Instructions  Stop lisinorpil  Start valsartan  80-12.5 one daily in its place Plan A = Automatic = Always=  Symbiocort 160 (or Breztri )  Take 2 puffs first thing in am and then another 2 puffs about 12 hours later.   Work on inhaler technique:  Plan B = Backup (to supplement plan A, not to replace it) Use your albuterol  inhaler(Proair )  as a rescue medication Plan C = Crisis (instead of Plan B but only if Plan B stops working) - only use your albuterol  nebulizer if you first try Plan B  See Dr Madison practice about your nasal obstruction   Call the lung cancer screening program directly @  [phone] to discuss scheduling your low dose CT scan > not done as of 12/14/2024 .   Please schedule a follow up office visit in 8 weeks, call sooner if needed - bring meds with you   CXR  pa and latersal 10/08/24  1. 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 2. Minimal bibasilar scarring or subsegmental atelectasis.   12/14/2024  f/u ov/Bladyn Tipps re: GOLD 2 copd    maint on ***  did *** bring meds  needs LDSCT *** No chief complaint on file.   Dyspnea:  *** Cough: ***  Sleeping: *** resp cc  SABA use: *** 02: ***  Lung cancer screening :  ***    No  obvious day to day or daytime variability or assoc excess/ purulent sputum or mucus plugs or hemoptysis or cp or chest tightness, subjective wheeze or overt sinus or hb symptoms.    Also denies any obvious fluctuation of symptoms with weather or environmental changes or other aggravating or alleviating factors except as outlined above   No unusual exposure hx or h/o childhood pna/ asthma or knowledge of premature birth.  Current Allergies, Complete Past Medical History, Past Surgical History, Family History, and Social History were reviewed in [employer] record.  ROS  The following are not active complaints unless bolded Hoarseness, sore throat, dysphagia, dental problems, itching, sneezing,  nasal congestion or discharge of excess mucus or purulent secretions, ear ache,   fever, chills, sweats, unintended wt loss or wt gain, classically pleuritic or exertional cp,  orthopnea pnd or arm/hand swelling  or leg swelling, presyncope, palpitations, abdominal pain, anorexia, nausea, vomiting, diarrhea  or change in bowel habits or change in bladder habits, change in stools or change in urine, dysuria, hematuria,  rash, arthralgias, visual complaints, headache, numbness, weakness or ataxia or problems with walking or coordination,  change in mood or  memory.          Outpatient Medications Prior to Visit  Medication Sig Dispense Refill   albuterol  (VENTOLIN  HFA) 108 (90 Base) MCG/ACT inhaler Inhale 2 puffs into the lungs every 4 (four) hours as needed for wheezing or shortness of breath.     amLODipine  (NORVASC ) 10 MG tablet Take 10 mg by mouth daily.     aspirin EC 325 MG tablet Take 325 mg by mouth daily.     atorvastatin  (LIPITOR) 10 MG tablet 1 tablet Orally Once a day     budesonide -glycopyrrolate-formoterol  (BREZTRI  AEROSPHERE) 160-9-4.8 MCG/ACT AERO inhaler Take 2 puffs first thing in am and then another 2 puffs about 12 hours later.     clonazePAM  (KLONOPIN ) 1 MG tablet  Take 2 mg by mouth at bedtime.     cloNIDine (CATAPRES) 0.2 MG tablet Take 0.2 mg by mouth daily.     EPINEPHrine  0.3 mg/0.3 mL IJ SOAJ injection Inject 0.3 mg into the muscle as needed for anaphylaxis.     fluticasone  (FLONASE) 50 MCG/ACT nasal spray Place 1 spray into both nostrils 3 (three) times daily as needed for allergies. (Patient taking differently: Place 1 spray into both nostrils 3 (three) times daily as needed for allergies. USING EVERYDAY)     HYDROcodone -acetaminophen  (NORCO) 7.5-325 MG tablet Take 1 tablet by mouth in the morning, at noon, and at bedtime.     ibuprofen  (ADVIL ) 200 MG tablet Take 200 mg by mouth every 6 (six) hours as needed for mild pain (pain score 1-3) or moderate pain (pain score 4-6).     ipratropium (ATROVENT ) 0.02 % nebulizer solution Take 0.5 mg by nebulization every 4 (four) hours as needed for wheezing or shortness of breath.     ipratropium-albuterol  (DUONEB) 0.5-2.5 (3) MG/3ML SOLN Take 3 mLs by nebulization every 4 (four) hours as needed for up to 20 days. 360 mL 0   montelukast (SINGULAIR) 10 MG tablet Take 10 mg by mouth at bedtime.     pantoprazole  (PROTONIX ) 40 MG tablet 1 tablet Orally Once a day     QUININE SULFATE PO Take 300 mg by mouth daily. Ordered from [place], pt taking 2  a  day     rosuvastatin  (CRESTOR ) 20 MG tablet Take 20 mg by mouth daily.     valsartan -hydrochlorothiazide  (DIOVAN  HCT) 80-12.5 MG tablet Take 1 tablet by mouth daily. 30 tablet 11   No facility-administered medications prior to visit.           Objective:   Physical Exam  Wts  12/14/2024        ***  10/21/2024       143   05/13/2022         161 09/03/2021        159  04/17/2021        151  07/21/2019        160  02/03/2019        163  08/03/2018        163  05/01/2018       167  03/20/2018        168  11/05/2017     167  07/15/2016       152   04/15/2016      159   03/01/16 157 lb (71.215 kg)  02/02/16 157 lb (71.215 kg)  10/09/12 142 lb (64.411 kg)    Vital  signs reviewed  12/14/2024  - Note at rest 02 sats  ***% on ***   General appearance:    ***    Min barr***        Assessment & Plan:         "
